# Patient Record
Sex: Male | Born: 1945 | Race: White | Hispanic: No | Marital: Single | Smoking: Never smoker
Health system: Southern US, Community
[De-identification: ages and names within clinical notes are randomized; demographics above are authoritative.]

## PROBLEM LIST (undated history)

## (undated) DIAGNOSIS — K921 Melena: Secondary | ICD-10-CM

## (undated) DIAGNOSIS — I2510 Atherosclerotic heart disease of native coronary artery without angina pectoris: Secondary | ICD-10-CM

## (undated) DIAGNOSIS — L02416 Cutaneous abscess of left lower limb: Secondary | ICD-10-CM

## (undated) DIAGNOSIS — I10 Essential (primary) hypertension: Secondary | ICD-10-CM

## (undated) DIAGNOSIS — N400 Enlarged prostate without lower urinary tract symptoms: Secondary | ICD-10-CM

## (undated) DIAGNOSIS — I4891 Unspecified atrial fibrillation: Secondary | ICD-10-CM

## (undated) DIAGNOSIS — Z7901 Long term (current) use of anticoagulants: Secondary | ICD-10-CM

## (undated) DIAGNOSIS — E079 Disorder of thyroid, unspecified: Secondary | ICD-10-CM

## (undated) DIAGNOSIS — C801 Malignant (primary) neoplasm, unspecified: Secondary | ICD-10-CM

## (undated) DIAGNOSIS — E119 Type 2 diabetes mellitus without complications: Secondary | ICD-10-CM

## (undated) DIAGNOSIS — Z741 Need for assistance with personal care: Secondary | ICD-10-CM

## (undated) HISTORY — PX: THYROIDECTOMY: SHX17

---
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
## 2005-08-31 ENCOUNTER — Ambulatory Visit (HOSPITAL_COMMUNITY): Admission: RE | Admit: 2005-08-31 | Discharge: 2005-08-31 | Payer: Self-pay | Admitting: Pulmonary Disease

## 2009-10-18 ENCOUNTER — Ambulatory Visit (HOSPITAL_COMMUNITY): Admission: RE | Admit: 2009-10-18 | Discharge: 2009-10-18 | Payer: Self-pay | Admitting: Pulmonary Disease

## 2009-10-29 ENCOUNTER — Ambulatory Visit (HOSPITAL_COMMUNITY): Admission: RE | Admit: 2009-10-29 | Discharge: 2009-10-29 | Payer: Self-pay | Admitting: Pulmonary Disease

## 2009-11-06 ENCOUNTER — Ambulatory Visit (HOSPITAL_COMMUNITY): Admission: RE | Admit: 2009-11-06 | Discharge: 2009-11-06 | Payer: Self-pay | Admitting: Pulmonary Disease

## 2009-11-11 ENCOUNTER — Encounter (HOSPITAL_COMMUNITY)
Admission: RE | Admit: 2009-11-11 | Discharge: 2009-12-11 | Payer: Self-pay | Source: Home / Self Care | Admitting: Oncology

## 2009-11-11 ENCOUNTER — Ambulatory Visit (HOSPITAL_COMMUNITY): Payer: Self-pay | Admitting: Oncology

## 2010-03-26 LAB — PROTIME-INR
INR: 0.98 (ref 0.00–1.49)
Prothrombin Time: 13.2 seconds (ref 11.6–15.2)
Prothrombin Time: 13.7 seconds (ref 11.6–15.2)

## 2010-03-26 LAB — DIFFERENTIAL
Basophils Absolute: 0 10*3/uL (ref 0.0–0.1)
Basophils Relative: 0 % (ref 0–1)
Eosinophils Absolute: 0.1 10*3/uL (ref 0.0–0.7)
Monocytes Relative: 10 % (ref 3–12)
Neutrophils Relative %: 67 % (ref 43–77)

## 2010-03-26 LAB — COMPREHENSIVE METABOLIC PANEL
ALT: 12 U/L (ref 0–53)
Alkaline Phosphatase: 35 U/L — ABNORMAL LOW (ref 39–117)
CO2: 29 mEq/L (ref 19–32)
Chloride: 94 mEq/L — ABNORMAL LOW (ref 96–112)
GFR calc non Af Amer: >60 mL/min (ref >60)
Glucose, Bld: 96 mg/dL (ref 70–99)
Potassium: 4.1 mEq/L (ref 3.5–5.1)
Sodium: 134 mEq/L — ABNORMAL LOW (ref 135–145)
Total Bilirubin: 0.9 mg/dL (ref 0.3–1.2)
Total Protein: 8.8 g/dL — ABNORMAL HIGH (ref 6.0–8.3)

## 2010-03-26 LAB — CBC
HCT: 44 % (ref 39.0–52.0)
Hemoglobin: 14.6 g/dL (ref 13.0–17.0)
Hemoglobin: 14.7 g/dL (ref 13.0–17.0)
MCH: 31.5 pg (ref 26.0–34.0)
MCV: 94.9 fL (ref 78.0–100.0)
RBC: 4.64 MIL/uL (ref 4.22–5.81)
WBC: 10.6 10*3/uL — ABNORMAL HIGH (ref 4.0–10.5)

## 2010-03-26 LAB — THYROGLOBULIN ANTIBODY: Thyroglobulin Ab: <20 U/mL (ref <40.0)

## 2010-03-26 LAB — GLUCOSE, CAPILLARY: Glucose-Capillary: 127 mg/dL — ABNORMAL HIGH (ref 70–99)

## 2010-05-13 ENCOUNTER — Emergency Department (HOSPITAL_COMMUNITY)
Admission: EM | Admit: 2010-05-13 | Discharge: 2010-05-13 | Disposition: A | Discharge status: Home / Self Care | Payer: Medicare Other | Attending: Emergency Medicine | Admitting: Emergency Medicine

## 2010-05-13 DIAGNOSIS — F840 Autistic disorder: Secondary | ICD-10-CM | POA: Insufficient documentation

## 2010-05-13 DIAGNOSIS — N289 Disorder of kidney and ureter, unspecified: Secondary | ICD-10-CM | POA: Insufficient documentation

## 2010-05-13 DIAGNOSIS — Z85850 Personal history of malignant neoplasm of thyroid: Secondary | ICD-10-CM | POA: Insufficient documentation

## 2010-05-13 DIAGNOSIS — R42 Dizziness and giddiness: Secondary | ICD-10-CM | POA: Insufficient documentation

## 2010-05-13 LAB — DIFFERENTIAL
Basophils Absolute: 0 10*3/uL (ref 0.0–0.1)
Basophils Relative: 0 % (ref 0–1)
Eosinophils Absolute: 0.2 10*3/uL (ref 0.0–0.7)
Eosinophils Relative: 2 % (ref 0–5)
Monocytes Absolute: 0.8 10*3/uL (ref 0.1–1.0)

## 2010-05-13 LAB — BASIC METABOLIC PANEL
CO2: 33 mEq/L — ABNORMAL HIGH (ref 19–32)
Calcium: >15 mg/dL (ref 8.4–10.5)
Creatinine, Ser: 2 mg/dL — ABNORMAL HIGH (ref 0.4–1.5)
GFR calc Af Amer: 41 mL/min — ABNORMAL LOW (ref >60)
Glucose, Bld: 116 mg/dL — ABNORMAL HIGH (ref 70–99)

## 2010-05-13 LAB — CBC
MCHC: 34 g/dL (ref 30.0–36.0)
RDW: 15.2 % (ref 11.5–15.5)

## 2010-05-13 LAB — GLUCOSE, CAPILLARY: Glucose-Capillary: 135 mg/dL — ABNORMAL HIGH (ref 70–99)

## 2010-11-07 DIAGNOSIS — C73 Malignant neoplasm of thyroid gland: Secondary | ICD-10-CM | POA: Insufficient documentation

## 2011-05-04 IMAGING — CT NM PET TUM IMG INITIAL (PI) SKULL BASE T - THIGH
6 series · 25 of 25 positions shown · IV contrast ([ID])
Comparison: 10/18/2009

CLINICAL DATA: Initial treatment strategy for metastatic
adenocarcinoma.

NUCLEAR MEDICINE PET CT INITIAL (PI) SKULL BASE TO THIGH
TECHNIQUE: 17.4 mCi F-18 FDG was injected intravenously via the
right hand.  Full-ring PET imaging was performed from the skull
base through the mid-thighs 65  minutes after injection.  CT data
was obtained and used for attenuation correction and anatomic
localization only.  (This was not acquired as a diagnostic CT
examination.)
Fasting Blood Glucose:  127

[Series 1: pet ac · axial · 3.3mm · 4.69mm/px · z∈[-880,-10]mm · 5 of 267 slices shown]
[im 1/267]
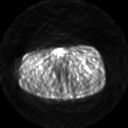
[im 67/267]
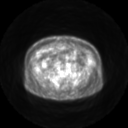
[im 134/267]
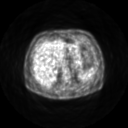
[im 200/267]
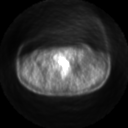
[im 267/267]
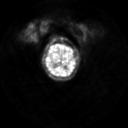

[Series 2: pet nac · axial · 3.3mm · 4.69mm/px · z∈[-880,-10]mm · 6 of 267 slices shown]
[im 1/267]
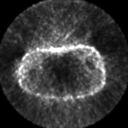
[im 54/267]
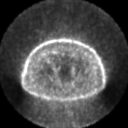
[im 107/267]
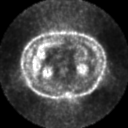
[im 160/267]
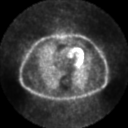
[im 213/267]
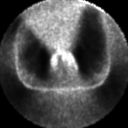
[im 267/267]
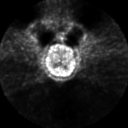

[Series 2: ct images · axial · 3.8mm · 0.98mm/px · z∈[-880,-10]mm · 5 of 267 slices shown]
[im 1/267]
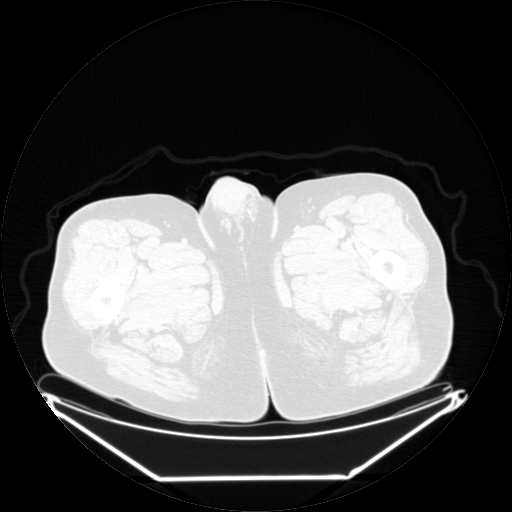
[im 67/267]
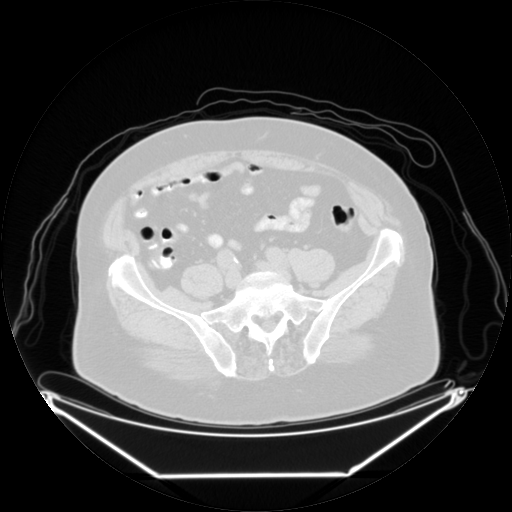
[im 134/267]
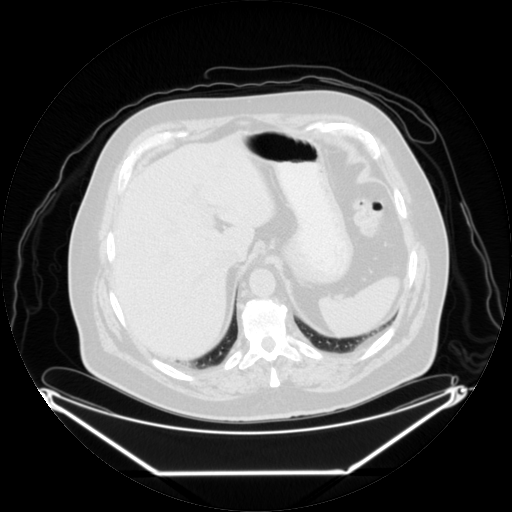
[im 200/267]
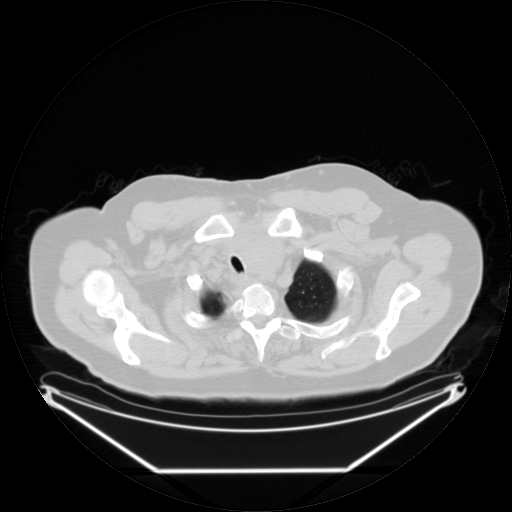
[im 267/267]
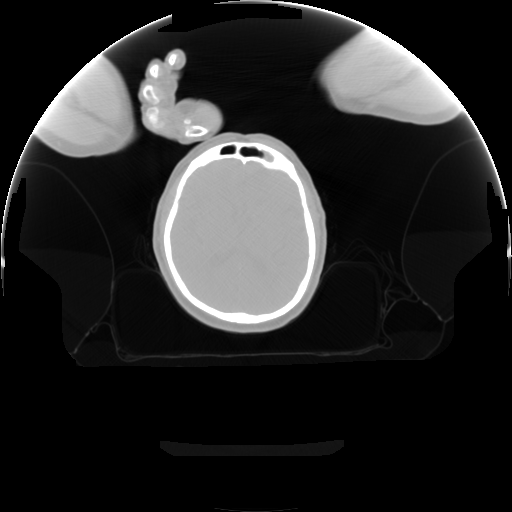

[Series 123: mip · coronal · 3.3mm · 4.69mm/px · 1 of 30 slices shown]
[im 1/30]
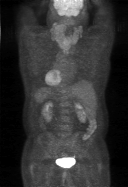

[Series 152: reformatted · axial · 3.3mm · 3.91mm/px · z∈[-880,-10]mm · 6 of 265 slices shown (1 of 2)]
[im 1/265]
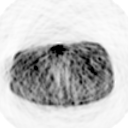
[im 53/265]
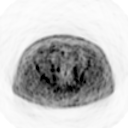
[im 106/265]
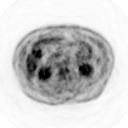
[im 159/265]
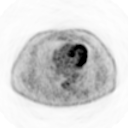
[im 212/265]
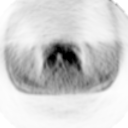
[im 265/265]
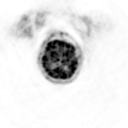

[Series 154: reformatted · coronal · 4.7mm · 6.98mm/px · 2 of 74 slices shown (2 of 2)]
[im 1/74]
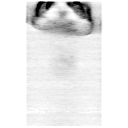
[im 74/74]
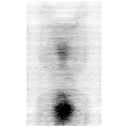

[25 of 25 positions shown; findings below may reference images not displayed]

FINDINGS: There is marked, diffuse enlargement of the thyroid gland as seen
on CT from 10/18/2009.  Intense radiotracer uptake throughout both
lobes of the thyroid gland is identified.  The SUV max associated
with the thyroid gland is equal to 11.9.  Enlarged and
hypermetabolic bilateral cervical adenopathy identified.  Level III
lymph node on the left measures 1.7 cm and has an SUV max equal to
7.0.

Level IV lymph node on the right measures 2.4 cm.  The SUV max
associated this lymph node is equal to 6.9.

Hypermetabolic left supraclavicular lymph node measures 1.8 cm and
has an SUV max equal to 8.1.

There is extension of the thyroid gland into the superior
mediastinum.  Several enlarged and hypermetabolic lymph nodes are
present within the superior mediastinum.  For example, left
paratracheal lymph node measures 1.6 cm and has an SUV max equal to
5.4.

There are no enlarged axillary lymph nodes.

No pericardial or pleural effusion present.

There are no hypermetabolic pulmonary parenchymal nodules or masses
identified.

There is no abnormal FDG uptake within the liver, spleen, adrenal
glands or pancreas.

No hypermetabolic lymph nodes within the soft tissues of the neck.

There are no hypermetabolic pelvic or inguinal lymph nodes.

No abnormal radiotracer uptake is identified within the visualized
axial or appendicular skeleton.
IMPRESSION: 1.  Massive enlargement of the thyroid gland which is diffusely
increased which exhibits diffuse increased radiotracer uptake.
Finding is suspicious for diffuse tumor infiltration.  Recommend
percutaneous biopsy.
2.  There are enlarged and hypermetabolic bilateral cervical, right
supraclavicular, and superior mediastinal lymph nodes which are
worrisome for metastatic adenopathy.

## 2012-07-27 DIAGNOSIS — K224 Dyskinesia of esophagus: Secondary | ICD-10-CM | POA: Insufficient documentation

## 2012-12-20 HISTORY — PX: NECK DISSECTION: SUR422

## 2013-08-10 DIAGNOSIS — G5790 Unspecified mononeuropathy of unspecified lower limb: Secondary | ICD-10-CM | POA: Insufficient documentation

## 2014-04-20 DIAGNOSIS — J302 Other seasonal allergic rhinitis: Secondary | ICD-10-CM | POA: Insufficient documentation

## 2014-10-26 DIAGNOSIS — I8393 Asymptomatic varicose veins of bilateral lower extremities: Secondary | ICD-10-CM | POA: Insufficient documentation

## 2018-02-03 DIAGNOSIS — Z87898 Personal history of other specified conditions: Secondary | ICD-10-CM | POA: Insufficient documentation

## 2018-02-03 DIAGNOSIS — Z8042 Family history of malignant neoplasm of prostate: Secondary | ICD-10-CM | POA: Insufficient documentation

## 2018-02-03 DIAGNOSIS — N401 Enlarged prostate with lower urinary tract symptoms: Secondary | ICD-10-CM | POA: Insufficient documentation

## 2018-02-06 ENCOUNTER — Emergency Department (HOSPITAL_COMMUNITY): Payer: Medicare HMO

## 2018-02-06 ENCOUNTER — Emergency Department (HOSPITAL_COMMUNITY)
Admission: EM | Admit: 2018-02-06 | Discharge: 2018-02-06 | Discharge status: Against Medical Advice | Payer: Medicare HMO | Attending: Emergency Medicine | Admitting: Emergency Medicine

## 2018-02-06 ENCOUNTER — Other Ambulatory Visit: Payer: Self-pay

## 2018-02-06 ENCOUNTER — Encounter (HOSPITAL_COMMUNITY): Payer: Self-pay | Admitting: Emergency Medicine

## 2018-02-06 DIAGNOSIS — I2510 Atherosclerotic heart disease of native coronary artery without angina pectoris: Secondary | ICD-10-CM | POA: Insufficient documentation

## 2018-02-06 DIAGNOSIS — I10 Essential (primary) hypertension: Secondary | ICD-10-CM | POA: Insufficient documentation

## 2018-02-06 DIAGNOSIS — E119 Type 2 diabetes mellitus without complications: Secondary | ICD-10-CM | POA: Diagnosis not present

## 2018-02-06 DIAGNOSIS — R55 Syncope and collapse: Secondary | ICD-10-CM | POA: Diagnosis present

## 2018-02-06 HISTORY — DX: Atherosclerotic heart disease of native coronary artery without angina pectoris: I25.10

## 2018-02-06 HISTORY — DX: Unspecified atrial fibrillation: I48.91

## 2018-02-06 HISTORY — DX: Type 2 diabetes mellitus without complications: E11.9

## 2018-02-06 HISTORY — DX: Essential (primary) hypertension: I10

## 2018-02-06 HISTORY — DX: Malignant (primary) neoplasm, unspecified: C80.1

## 2018-02-06 HISTORY — DX: Disorder of thyroid, unspecified: E07.9

## 2018-02-06 HISTORY — DX: Benign prostatic hyperplasia without lower urinary tract symptoms: N40.0

## 2018-02-06 LAB — CBC WITH DIFFERENTIAL/PLATELET
Abs Immature Granulocytes: 0.03 10*3/uL (ref 0.00–0.07)
Basophils Absolute: 0 10*3/uL (ref 0.0–0.1)
Basophils Relative: 1 %
EOS PCT: 1 %
Eosinophils Absolute: 0.1 10*3/uL (ref 0.0–0.5)
HCT: 42.8 % (ref 39.0–52.0)
Hemoglobin: 14.3 g/dL (ref 13.0–17.0)
Immature Granulocytes: 0 %
Lymphocytes Relative: 6 %
Lymphs Abs: 0.5 10*3/uL — ABNORMAL LOW (ref 0.7–4.0)
MCH: 31.1 pg (ref 26.0–34.0)
MCHC: 33.4 g/dL (ref 30.0–36.0)
MCV: 93 fL (ref 80.0–100.0)
Monocytes Absolute: 0.8 10*3/uL (ref 0.1–1.0)
Monocytes Relative: 9 %
Neutro Abs: 7 10*3/uL (ref 1.7–7.7)
Neutrophils Relative %: 83 %
Platelets: 223 10*3/uL (ref 150–400)
RBC: 4.6 MIL/uL (ref 4.22–5.81)
RDW: 12.9 % (ref 11.5–15.5)
WBC: 8.4 10*3/uL (ref 4.0–10.5)
nRBC: 0 % (ref 0.0–0.2)

## 2018-02-06 LAB — BASIC METABOLIC PANEL
Anion gap: 11 (ref 5–15)
BUN: 21 mg/dL (ref 8–23)
CHLORIDE: 93 mmol/L — AB (ref 98–111)
CO2: 28 mmol/L (ref 22–32)
Calcium: 7.6 mg/dL — ABNORMAL LOW (ref 8.9–10.3)
Creatinine, Ser: 0.9 mg/dL (ref 0.61–1.24)
GFR calc non Af Amer: >60 mL/min (ref >60)
Glucose, Bld: 132 mg/dL — ABNORMAL HIGH (ref 70–99)
POTASSIUM: 3.8 mmol/L (ref 3.5–5.1)
SODIUM: 132 mmol/L — AB (ref 135–145)

## 2018-02-06 LAB — TROPONIN I: Troponin I: <0.03 ng/mL (ref <0.03)

## 2018-02-06 NOTE — ED Notes (Signed)
Pt requested to be transferred to [facility] but they could not accept him at this time. The pt signed out AMA and would go POV so that he could be seen at [facility]. Pt verbalized understanding of this.

## 2018-02-06 NOTE — Discharge Instructions (Addendum)
Your risk of dying suddenly by not being on heart monitor.  Please go to [facility] or another emergency department immediately upon leaving here for further evaluation.  You have an abnormality on your chest x-ray.  We recommend that you have a CT scan of your chest within the next few weeks

## 2018-02-06 NOTE — ED Triage Notes (Signed)
Pt comes in with complaint of near syncope this morning.  States he went into the kitchen to eat breakfast, sat down, and thought he was going to pass out.  EMS reported initial bp to be 03J systolic with frequent pvcs on monitor.  Pt had a foley placed on Thursday for urinary retention and has been having blood in urine since.  Penis noted to be very edematous with blood on foley tubing.  Also had recent thyroid biopsy for recurrent thyroid cancer and area is red and raised.

## 2018-02-06 NOTE — ED Provider Notes (Addendum)
[facility] EMERGENCY DEPARTMENT Provider Note   CSN: [id number] Arrival date & time: 02/06/18  3762     History   Chief Complaint Chief Complaint  Patient presents with  . Near Syncope    HPI Paul Bradley is a 73 y.o. male.  HPI Patient had syncopal event this morning 645.  His sister said that his speech was slurred for 1 or 2 minutes prior to the syncopal event.  He complained of left shoulder pain prior to syncopal event however is presently pain-free.  He denies any shortness of breath denies chest pain denies abdominal pain.  No other associated symptoms.  No treatment prior to coming here.  Brought by EMS. Past Medical History:  Diagnosis Date  . Atrial fibrillation ([place])   . BPH (benign prostatic hyperplasia)   . Cancer ([place])    thyroid  . Coronary artery disease   . Diabetes mellitus without complication ([place])   . Hypertension   . Thyroid disease     There are no active problems to display for this patient.   Past Surgical History:  Procedure Laterality Date  . THYROIDECTOMY          Home Medications    Prior to Admission medications   Not on File    Family History History reviewed. No pertinent family history.  Social History Social History   Tobacco Use  . Smoking status: Never Smoker  . Smokeless tobacco: Never Used  Substance Use Topics  . Alcohol use: Never    Frequency: Never  . Drug use: Never     Allergies   Penicillins   Review of Systems Review of Systems  Genitourinary: Positive for difficulty urinating.       Currently with indwelling Foley catheter, placed last week for urinary retention  Musculoskeletal: Positive for arthralgias.       Shoulder pain resolved  Allergic/Immunologic: Positive for immunocompromised state.       Cancer patient  Hematological: Bruises/bleeds easily.     Physical Exam Updated Vital Signs Ht 6' (1.829 m)   Wt 83.5 kg   BMI 24.95 kg/m   Physical Exam Vitals signs and nursing note  reviewed.  Constitutional:      Appearance: Normal appearance. He is well-developed. He is not ill-appearing.  HENT:     Head: Normocephalic and atraumatic.  Eyes:     Conjunctiva/sclera: Conjunctivae normal.     Pupils: Pupils are equal, round, and reactive to light.  Neck:     Musculoskeletal: Neck supple.     Thyroid: No thyromegaly.     Trachea: No tracheal deviation.     Comments: Surgical scar at anterior neck.  Area is not red swollen or tender. Cardiovascular:     Rate and Rhythm: Normal rate.     Pulses: Normal pulses.     Heart sounds: No murmur.     Comments: Irregularly irregular Pulmonary:     Effort: Pulmonary effort is normal.     Breath sounds: Normal breath sounds.  Abdominal:     General: Bowel sounds are normal. There is no distension.     Palpations: Abdomen is soft.     Tenderness: There is no abdominal tenderness.  Genitourinary:    Comments: Indwelling Foley catheter minimal swelling at glans penis minimal dried blood at urethral meatus.  No gross hematuria in catheter bag.  Urine is rust colored Musculoskeletal: Normal range of motion.        General: No swelling, tenderness, deformity or signs  of injury.     Right lower leg: No edema.     Left lower leg: No edema.     Comments: He has mild pain at left trapezius muscle upon forcible flexion of left shoulder, reproducing pain that he complained of earlier  Skin:    General: Skin is warm and dry.     Findings: No rash.  Neurological:     Mental Status: He is alert.     Coordination: Coordination normal.      ED Treatments / Results  Labs (all labs ordered are listed, but only abnormal results are displayed) Labs Reviewed - No data to display  EKG EKG Interpretation  Date/Time:  Sunday February 06 2018 07:40:47 EST Ventricular Rate:  81 PR Interval:    QRS Duration: 86 QT Interval:  395 QTC Calculation: 459 R Axis:   79 Text Interpretation:  Atrial fibrillation Low voltage, extremity leads  Nonspecific T abnormalities, lateral leads Atrial fibrillation New since previous tracing Confirmed by Orlie Dakin [phone]) on 02/06/2018 8:17:42 AM  X-ray viewed by me Results for orders placed or performed during the hospital encounter of 70/17/79  Basic metabolic panel  Result Value Ref Range   Sodium 132 (L) 135 - 145 mmol/L   Potassium 3.8 3.5 - 5.1 mmol/L   Chloride 93 (L) 98 - 111 mmol/L   CO2 28 22 - 32 mmol/L   Glucose, Bld 132 (H) 70 - 99 mg/dL   BUN 21 8 - 23 mg/dL   Creatinine, Ser 0.90 0.61 - 1.24 mg/dL   Calcium 7.6 (L) 8.9 - 10.3 mg/dL   GFR calc non Af Amer >60 >60 mL/min   GFR calc Af Amer >60 >60 mL/min   Anion gap 11 5 - 15  CBC with Differential/Platelet  Result Value Ref Range   WBC 8.4 4.0 - 10.5 K/uL   RBC 4.60 4.22 - 5.81 MIL/uL   Hemoglobin 14.3 13.0 - 17.0 g/dL   HCT 42.8 39.0 - 52.0 %   MCV 93.0 80.0 - 100.0 fL   MCH 31.1 26.0 - 34.0 pg   MCHC 33.4 30.0 - 36.0 g/dL   RDW 12.9 11.5 - 15.5 %   Platelets 223 150 - 400 K/uL   nRBC 0.0 0.0 - 0.2 %   Neutrophils Relative % 83 %   Neutro Abs 7.0 1.7 - 7.7 K/uL   Lymphocytes Relative 6 %   Lymphs Abs 0.5 (L) 0.7 - 4.0 K/uL   Monocytes Relative 9 %   Monocytes Absolute 0.8 0.1 - 1.0 K/uL   Eosinophils Relative 1 %   Eosinophils Absolute 0.1 0.0 - 0.5 K/uL   Basophils Relative 1 %   Basophils Absolute 0.0 0.0 - 0.1 K/uL   Immature Granulocytes 0 %   Abs Immature Granulocytes 0.03 0.00 - 0.07 K/uL  Troponin I - Add-On to previous collection  Result Value Ref Range   Troponin I <0.03 <0.03 ng/mL   Dg Chest Port 1 View  Result Date: 02/06/2018 CLINICAL DATA:  Near syncope this morning. Recent thyroid biopsy for recurrent thyroid carcinoma. EXAM: PORTABLE CHEST 1 VIEW COMPARISON:  None. FINDINGS: Postsurgical changes in the lower neck. Significant prominence of the upper RIGHT mediastinum. Lungs are clear. No pleural effusion or pneumothorax seen. Osseous structures about the chest are unremarkable.  IMPRESSION: 1. Significant prominence of the upper RIGHT mediastinum. This could represent inferior extension of a thyroid mass or mediastinal lymphadenopathy. Recommend chest CT with contrast for further characterization. 2. Lungs are clear.  Electronically Signed   By: Franki Cabot M.D.   On: 02/06/2018 08:33   Radiology No results found.  Procedures Procedures (including critical care time)  Medications Ordered in ED Medications - No data to display Chest x-ray viewed by me  Initial Impression / Assessment and Plan / ED Course  I have reviewed the triage vital signs and the nursing notes.  Pertinent labs & imaging results that were available during my care of the patient were reviewed by me and considered in my medical decision making (see chart for details).     Patient requesting transfer to [facility].  PCP is Dr.Demons-[name] I called transfer center at [facility] and spoke with DrPannu hospitalist.  No bed available.  I also spoke with Dr. Caryl Comes, emergency physician at [facility] who explained he cannot accept patient in transfer to the emergency department at [facility] since no bed is available.  Patient and family have decided to leave here [place] and states they will drive him directly to [facility] emergency department.  Explained risk of sudden cardiac death which family and patient are aware of and understand.  Patient will be supplied with copies of diagnostics and EKG and chest x-ray Work unremarkable. Final Clinical Impressions(s) / ED Diagnoses  DX #1 syncope #2 abnormal chest x-ray Final diagnoses:  None    ED Discharge Orders    None       Orlie Dakin, MD 02/06/18 [place], [place], MD 02/06/18 1114

## 2018-02-23 DIAGNOSIS — N312 Flaccid neuropathic bladder, not elsewhere classified: Secondary | ICD-10-CM | POA: Insufficient documentation

## 2018-02-23 DIAGNOSIS — N281 Cyst of kidney, acquired: Secondary | ICD-10-CM | POA: Insufficient documentation

## 2018-02-23 DIAGNOSIS — R339 Retention of urine, unspecified: Secondary | ICD-10-CM | POA: Insufficient documentation

## 2018-03-25 DIAGNOSIS — E039 Hypothyroidism, unspecified: Secondary | ICD-10-CM | POA: Insufficient documentation

## 2018-03-25 DIAGNOSIS — R627 Adult failure to thrive: Secondary | ICD-10-CM | POA: Insufficient documentation

## 2018-03-25 DIAGNOSIS — Z7901 Long term (current) use of anticoagulants: Secondary | ICD-10-CM | POA: Insufficient documentation

## 2018-03-25 DIAGNOSIS — G988 Other disorders of nervous system: Secondary | ICD-10-CM | POA: Insufficient documentation

## 2018-04-14 DIAGNOSIS — R1312 Dysphagia, oropharyngeal phase: Secondary | ICD-10-CM | POA: Insufficient documentation

## 2018-05-05 DIAGNOSIS — K921 Melena: Secondary | ICD-10-CM

## 2018-05-05 HISTORY — DX: Melena: K92.1

## 2018-06-01 ENCOUNTER — Other Ambulatory Visit (HOSPITAL_COMMUNITY): Payer: Self-pay | Admitting: Urology

## 2018-06-01 DIAGNOSIS — R339 Retention of urine, unspecified: Secondary | ICD-10-CM

## 2018-06-03 ENCOUNTER — Other Ambulatory Visit: Payer: Self-pay | Admitting: Student

## 2018-06-04 ENCOUNTER — Emergency Department (HOSPITAL_COMMUNITY): Payer: Medicare HMO

## 2018-06-04 ENCOUNTER — Encounter (HOSPITAL_COMMUNITY): Payer: Self-pay

## 2018-06-04 ENCOUNTER — Other Ambulatory Visit: Payer: Self-pay

## 2018-06-04 ENCOUNTER — Emergency Department (HOSPITAL_COMMUNITY)
Admission: EM | Admit: 2018-06-04 | Discharge: 2018-06-04 | Disposition: A | Discharge status: Home / Self Care | Payer: Medicare HMO | Attending: Emergency Medicine | Admitting: Emergency Medicine

## 2018-06-04 DIAGNOSIS — E119 Type 2 diabetes mellitus without complications: Secondary | ICD-10-CM | POA: Diagnosis not present

## 2018-06-04 DIAGNOSIS — I4891 Unspecified atrial fibrillation: Secondary | ICD-10-CM | POA: Insufficient documentation

## 2018-06-04 DIAGNOSIS — C73 Malignant neoplasm of thyroid gland: Secondary | ICD-10-CM | POA: Insufficient documentation

## 2018-06-04 DIAGNOSIS — Z79899 Other long term (current) drug therapy: Secondary | ICD-10-CM | POA: Diagnosis not present

## 2018-06-04 DIAGNOSIS — R531 Weakness: Secondary | ICD-10-CM

## 2018-06-04 DIAGNOSIS — I2510 Atherosclerotic heart disease of native coronary artery without angina pectoris: Secondary | ICD-10-CM | POA: Insufficient documentation

## 2018-06-04 LAB — CBC WITH DIFFERENTIAL/PLATELET
Abs Immature Granulocytes: 0.02 10*3/uL (ref 0.00–0.07)
Basophils Absolute: 0.1 10*3/uL (ref 0.0–0.1)
Basophils Relative: 1 %
Eosinophils Absolute: 0 10*3/uL (ref 0.0–0.5)
Eosinophils Relative: 0 %
HCT: 41.2 % (ref 39.0–52.0)
Hemoglobin: 13.6 g/dL (ref 13.0–17.0)
Immature Granulocytes: 0 %
Lymphocytes Relative: 17 %
Lymphs Abs: 1.2 10*3/uL (ref 0.7–4.0)
MCH: 30.9 pg (ref 26.0–34.0)
MCHC: 33 g/dL (ref 30.0–36.0)
MCV: 93.6 fL (ref 80.0–100.0)
Monocytes Absolute: 0.6 10*3/uL (ref 0.1–1.0)
Monocytes Relative: 9 %
Neutro Abs: 4.9 10*3/uL (ref 1.7–7.7)
Neutrophils Relative %: 73 %
Platelets: 263 10*3/uL (ref 150–400)
RBC: 4.4 MIL/uL (ref 4.22–5.81)
RDW: 15.2 % (ref 11.5–15.5)
WBC: 6.7 10*3/uL (ref 4.0–10.5)
nRBC: 0 % (ref 0.0–0.2)

## 2018-06-04 LAB — COMPREHENSIVE METABOLIC PANEL
ALT: 19 U/L (ref 0–44)
AST: 40 U/L (ref 15–41)
Albumin: 3.3 g/dL — ABNORMAL LOW (ref 3.5–5.0)
Alkaline Phosphatase: 43 U/L (ref 38–126)
Anion gap: 11 (ref 5–15)
BUN: 28 mg/dL — ABNORMAL HIGH (ref 8–23)
CO2: 30 mmol/L (ref 22–32)
Calcium: 12.1 mg/dL — ABNORMAL HIGH (ref 8.9–10.3)
Chloride: 98 mmol/L (ref 98–111)
Creatinine, Ser: 1.18 mg/dL (ref 0.61–1.24)
GFR calc Af Amer: >60 mL/min (ref >60)
GFR calc non Af Amer: >60 mL/min (ref >60)
Glucose, Bld: 105 mg/dL — ABNORMAL HIGH (ref 70–99)
Potassium: 3.5 mmol/L (ref 3.5–5.1)
Sodium: 139 mmol/L (ref 135–145)
Total Bilirubin: 0.8 mg/dL (ref 0.3–1.2)
Total Protein: 7.6 g/dL (ref 6.5–8.1)

## 2018-06-04 LAB — MAGNESIUM: Magnesium: 2 mg/dL (ref 1.7–2.4)

## 2018-06-04 MED ORDER — SODIUM CHLORIDE 0.9 % IV BOLUS
500.0000 mL | Freq: Once | INTRAVENOUS | Status: AC
  Administered 2018-06-04: 500 mL via INTRAVENOUS

## 2018-06-04 MED ORDER — METOPROLOL SUCCINATE ER 25 MG PO TB24
25.0000 mg | ORAL_TABLET | Freq: Every day | ORAL | Status: DC
  Administered 2018-06-04: 25 mg via ORAL
  Filled 2018-06-04: qty 1

## 2018-06-04 MED ORDER — LISINOPRIL 10 MG PO TABS
10.0000 mg | ORAL_TABLET | Freq: Once | ORAL | Status: AC
  Administered 2018-06-04: 10 mg via ORAL
  Filled 2018-06-04: qty 1

## 2018-06-04 MED ORDER — SODIUM CHLORIDE 0.9 % IV SOLN
INTRAVENOUS | Status: DC
  Administered 2018-06-04: 10:00:00 via INTRAVENOUS

## 2018-06-04 NOTE — ED Triage Notes (Signed)
cbg 108 per ems

## 2018-06-04 NOTE — ED Notes (Signed)
Patient ambulated approx. 161ft with front wheeled walker and stay by assist.  Patient stated that this is his usual state of ambulation and feels this is his baseline.

## 2018-06-04 NOTE — ED Provider Notes (Signed)
[facility] EMERGENCY DEPARTMENT Provider Note   CSN: [id number] Arrival date & time: 06/04/18  0825    History   Chief Complaint Chief Complaint  Patient presents with  . Weakness    HPI Paul Bradley is a 73 y.o. male.     Patient sent in by EMS for generalized weakness this morning.  Not able to get out of bed or stand.  Patient has a history of papillary thyroid cancer.  Is followed by hematology oncology at [place] to have worsening mediastinal and thoracic adenopathy based on CAT scans from September 2019.  Patient was seen by [facility] yesterday.  Was noted to have a calcium of 12.1.  Was given Zometa.  Was noted to have a low sodium and was given some IV normal saline.  It was stated that patient did not desire admission if it could be avoided.  Shortly after arrival here patient was feeling back to normal.  No specific complaints.  Patient denies any history of fever or upper respiratory symptoms.  Patient states he did not have any of his morning meds.  Medication review shows that patient is on Calcitrol.  Patient normally ambulates with a walker at home.. Patient also has a history of atrial fibrillation.  Is on Eliquis for that.     Past Medical History:  Diagnosis Date  . Atrial fibrillation ([place])   . BPH (benign prostatic hyperplasia)   . Cancer ([place])    thyroid  . Coronary artery disease   . Diabetes mellitus without complication ([place])   . Hypertension   . Thyroid disease     There are no active problems to display for this patient.   Past Surgical History:  Procedure Laterality Date  . THYROIDECTOMY          Home Medications    Prior to Admission medications   Medication Sig Start Date End Date Taking? Authorizing Provider  apixaban (ELIQUIS) 5 MG TABS tablet Take 5 mg by mouth 2 (two) times daily.    [provider]  Ascorbic Acid (VITAMIN C) 1000 MG tablet Take 1,000 mg by mouth daily.    [provider]  calcitRIOL  (ROCALTROL) 0.5 MCG capsule Take 0.5 mcg by mouth 2 (two) times daily.    [provider]  hydrochlorothiazide (HYDRODIURIL) 25 MG tablet Take 25 mg by mouth daily.    [provider]  levothyroxine (SYNTHROID, LEVOTHROID) 150 MCG tablet Take 150 mcg by mouth daily before breakfast.    [provider]  lisinopril (PRINIVIL,ZESTRIL) 10 MG tablet Take 10 mg by mouth daily.    [provider]  metoprolol succinate (TOPROL-XL) 25 MG 24 hr tablet Take 25 mg by mouth daily.    [provider]  tamsulosin (FLOMAX) 0.4 MG CAPS capsule Take 0.4 mg by mouth.    [provider]    Family History No family history on file.  Social History Social History   Tobacco Use  . Smoking status: Never Smoker  . Smokeless tobacco: Never Used  Substance Use Topics  . Alcohol use: Never    Frequency: Never  . Drug use: Never     Allergies   Penicillins   Review of Systems Review of Systems  Constitutional: Negative for chills and fever.  HENT: Negative for congestion, rhinorrhea and sore throat.   Eyes: Negative for visual disturbance.  Respiratory: Negative for cough and shortness of breath.   Cardiovascular: Negative for chest pain and leg swelling.  Gastrointestinal: Negative for abdominal pain, diarrhea, nausea and vomiting.  Genitourinary: Negative for dysuria.  Musculoskeletal: Negative for back pain and neck pain.  Skin: Negative for rash.  Neurological: Positive for weakness. Negative for dizziness, light-headedness and headaches.  Hematological: Does not bruise/bleed easily.  Psychiatric/Behavioral: Negative for confusion.     Physical Exam Updated Vital Signs BP (!) 161/106 (BP Location: Right Arm)   Pulse (!) 101   Temp 98.1 F (36.7 C) (Oral)   Resp 18   Ht 1.829 m (6')   Wt 70.8 kg   SpO2 96%   BMI 21.16 kg/m   Physical Exam Vitals signs and nursing note reviewed.  Constitutional:      General: He is not in acute  distress.    Appearance: Normal appearance. He is well-developed.  HENT:     Head: Normocephalic and atraumatic.  Eyes:     Extraocular Movements: Extraocular movements intact.     Conjunctiva/sclera: Conjunctivae normal.     Pupils: Pupils are equal, round, and reactive to light.  Neck:     Musculoskeletal: Normal range of motion and neck supple.     Comments: Anterior neck with some skin changes.  Perhaps secondary to radiation.  No significant fullness.  Patient without any difficulty swallowing.  Voice appears normal. Cardiovascular:     Rate and Rhythm: Normal rate and regular rhythm.     Heart sounds: No murmur.  Pulmonary:     Effort: Pulmonary effort is normal. No respiratory distress.     Breath sounds: Normal breath sounds.  Abdominal:     Palpations: Abdomen is soft.     Tenderness: There is no abdominal tenderness.  Musculoskeletal: Normal range of motion.        General: No swelling.  Skin:    General: Skin is warm and dry.     Capillary Refill: Capillary refill takes less than 2 seconds.  Neurological:     General: No focal deficit present.     Mental Status: He is alert and oriented to person, place, and time.     Cranial Nerves: No cranial nerve deficit.     Sensory: No sensory deficit.     Motor: No weakness.      ED Treatments / Results  Labs (all labs ordered are listed, but only abnormal results are displayed) Labs Reviewed  COMPREHENSIVE METABOLIC PANEL - Abnormal; Notable for the following components:      Result Value   Glucose, Bld 105 (*)    BUN 28 (*)    Calcium 12.1 (*)    Albumin 3.3 (*)    All other components within normal limits  CBC WITH DIFFERENTIAL/PLATELET  MAGNESIUM  CALCIUM, IONIZED    EKG EKG Interpretation  Date/Time:  Saturday Jun 04 2018 08:44:34 EDT Ventricular Rate:  103 PR Interval:    QRS Duration: 85 QT Interval:  334 QTC Calculation: 438 R Axis:   108 Text Interpretation:  Atrial fibrillation Probable lateral  infarct, age indeterminate Confirmed by Fredia Sorrow [phone]) on 06/04/2018 9:30:07 AM   Radiology Dg Chest Port 1 View  Result Date: 06/04/2018 CLINICAL DATA:  Weakness.  Thyroid cancer. EXAM: PORTABLE CHEST 1 VIEW COMPARISON:  02/06/2018 FINDINGS: Heart size upper normal. Negative for heart failure. No infiltrate or effusion Surgical clips in the region of the thyroid. Prominent soft tissue on both sides of the trachea could represent substernal goiter or adenopathy. Note is made of goiter on the CT of 11/06/2009 however there has been interval thyroidectomy.  IMPRESSION: Prominent superior mediastinal soft tissue compatible with goiter or adenopathy. Recommend CT neck and chest with contrast. Electronically Signed   By: Franchot Gallo M.D.   On: 06/04/2018 09:01    Procedures Procedures (including critical care time)  Medications Ordered in ED Medications  0.9 %  sodium chloride infusion ( Intravenous New Bag/Given 06/04/18 0945)  metoprolol succinate (TOPROL-XL) 24 hr tablet 25 mg (25 mg Oral Given 06/04/18 1039)  sodium chloride 0.9 % bolus 500 mL (0 mLs Intravenous Stopped 06/04/18 0946)  lisinopril (ZESTRIL) tablet 10 mg (10 mg Oral Given 06/04/18 1039)     Initial Impression / Assessment and Plan / ED Course  I have reviewed the triage vital signs and the nursing notes.  Pertinent labs & imaging results that were available during my care of the patient were reviewed by me and considered in my medical decision making (see chart for details).       Patient ambulated with walker here fine patient states that that is how he normally gets around.  Lab work-up shows that calcium was 12.1.  Sodium was normal today at 139.  Did note that patient is receiving Calcitrol.  Supposed to be 0.5 mcg daily.  [facility] oncology on call spoke with Dr. Phillip Heal.  He recommended having patient go with the Calcitrol every other day.  And they will follow him up in the clinic later this week.   Patient seems to be baseline from the weakness standpoint.  No indication for admission based on today's visit.  Patient has a known history of atrial fibrillation.  Is on Eliquis for that.  Patient was slightly tachycardic today and blood pressure was elevated he received his lisinopril and Toprol XL she did not have this morning before coming in.   Final Clinical Impressions(s) / ED Diagnoses   Final diagnoses:  Hypercalcemia of malignancy  Generalized weakness    ED Discharge Orders    None       Fredia Sorrow, MD 06/04/18 1109

## 2018-06-04 NOTE — Discharge Instructions (Signed)
Discussed with oncology at [facility] Dr. Phillip Heal.  They recommend that you take your Calcitrol every other day at a dose of 0.5 mcg.  Make an appointment to follow-up with them in about a week.  Return for any new or worse symptoms.  Otherwise labs today without any significant abnormalities.  Your sodium has improved significantly and is now in a normal range.  Patient was ambulated here with walker and did very well.  Weakness seems to have resolved.

## 2018-06-04 NOTE — ED Triage Notes (Signed)
EMS reports pt has thyroid cancer.  Reports new onset of immobility that started this morning.  Reports pt wasn't able to get oob due to weakness.  Reports was at [facility] yesterday.  Reports labs were abnormal and was given some na and calcium IV.

## 2018-06-05 ENCOUNTER — Emergency Department (HOSPITAL_COMMUNITY): Payer: Medicare HMO

## 2018-06-05 ENCOUNTER — Encounter (HOSPITAL_COMMUNITY): Payer: Self-pay | Admitting: Emergency Medicine

## 2018-06-05 ENCOUNTER — Observation Stay (HOSPITAL_COMMUNITY)
Admission: EM | Admit: 2018-06-05 | Discharge: 2018-06-09 | Disposition: A | Discharge status: Home / Self Care | Payer: Medicare HMO | Attending: Family Medicine | Admitting: Family Medicine

## 2018-06-05 ENCOUNTER — Other Ambulatory Visit: Payer: Self-pay

## 2018-06-05 DIAGNOSIS — Z20828 Contact with and (suspected) exposure to other viral communicable diseases: Secondary | ICD-10-CM | POA: Insufficient documentation

## 2018-06-05 DIAGNOSIS — I10 Essential (primary) hypertension: Secondary | ICD-10-CM | POA: Insufficient documentation

## 2018-06-05 DIAGNOSIS — Z7901 Long term (current) use of anticoagulants: Secondary | ICD-10-CM

## 2018-06-05 DIAGNOSIS — E119 Type 2 diabetes mellitus without complications: Secondary | ICD-10-CM | POA: Insufficient documentation

## 2018-06-05 DIAGNOSIS — E876 Hypokalemia: Secondary | ICD-10-CM | POA: Diagnosis not present

## 2018-06-05 DIAGNOSIS — I2510 Atherosclerotic heart disease of native coronary artery without angina pectoris: Secondary | ICD-10-CM | POA: Diagnosis not present

## 2018-06-05 DIAGNOSIS — Z85850 Personal history of malignant neoplasm of thyroid: Secondary | ICD-10-CM | POA: Insufficient documentation

## 2018-06-05 DIAGNOSIS — D62 Acute posthemorrhagic anemia: Secondary | ICD-10-CM | POA: Diagnosis present

## 2018-06-05 DIAGNOSIS — K921 Melena: Secondary | ICD-10-CM | POA: Diagnosis not present

## 2018-06-05 DIAGNOSIS — K5790 Diverticulosis of intestine, part unspecified, without perforation or abscess without bleeding: Secondary | ICD-10-CM | POA: Diagnosis present

## 2018-06-05 DIAGNOSIS — R197 Diarrhea, unspecified: Secondary | ICD-10-CM | POA: Diagnosis not present

## 2018-06-05 DIAGNOSIS — D500 Iron deficiency anemia secondary to blood loss (chronic): Secondary | ICD-10-CM | POA: Insufficient documentation

## 2018-06-05 DIAGNOSIS — Z79899 Other long term (current) drug therapy: Secondary | ICD-10-CM | POA: Insufficient documentation

## 2018-06-05 DIAGNOSIS — I951 Orthostatic hypotension: Secondary | ICD-10-CM

## 2018-06-05 DIAGNOSIS — I482 Chronic atrial fibrillation, unspecified: Secondary | ICD-10-CM | POA: Diagnosis present

## 2018-06-05 DIAGNOSIS — R531 Weakness: Secondary | ICD-10-CM | POA: Diagnosis not present

## 2018-06-05 DIAGNOSIS — K922 Gastrointestinal hemorrhage, unspecified: Secondary | ICD-10-CM | POA: Diagnosis not present

## 2018-06-05 DIAGNOSIS — D649 Anemia, unspecified: Secondary | ICD-10-CM | POA: Diagnosis not present

## 2018-06-05 DIAGNOSIS — E039 Hypothyroidism, unspecified: Secondary | ICD-10-CM | POA: Diagnosis not present

## 2018-06-05 HISTORY — DX: Long term (current) use of anticoagulants: Z79.01

## 2018-06-05 HISTORY — DX: Need for assistance with personal care: Z74.1

## 2018-06-05 HISTORY — DX: Cutaneous abscess of left lower limb: L02.416

## 2018-06-05 LAB — PROTIME-INR
INR: 1.2 (ref 0.8–1.2)
Prothrombin Time: 15.3 seconds — ABNORMAL HIGH (ref 11.4–15.2)

## 2018-06-05 LAB — COMPREHENSIVE METABOLIC PANEL
ALT: 18 U/L (ref 0–44)
AST: 37 U/L (ref 15–41)
Albumin: 3 g/dL — ABNORMAL LOW (ref 3.5–5.0)
Alkaline Phosphatase: 40 U/L (ref 38–126)
Anion gap: 10 (ref 5–15)
BUN: 30 mg/dL — ABNORMAL HIGH (ref 8–23)
CO2: 29 mmol/L (ref 22–32)
Calcium: 10.7 mg/dL — ABNORMAL HIGH (ref 8.9–10.3)
Chloride: 91 mmol/L — ABNORMAL LOW (ref 98–111)
Creatinine, Ser: 1.26 mg/dL — ABNORMAL HIGH (ref 0.61–1.24)
GFR calc Af Amer: >60 mL/min (ref >60)
GFR calc non Af Amer: 56 mL/min — ABNORMAL LOW (ref >60)
Glucose, Bld: 113 mg/dL — ABNORMAL HIGH (ref 70–99)
Potassium: 3 mmol/L — ABNORMAL LOW (ref 3.5–5.1)
Sodium: 130 mmol/L — ABNORMAL LOW (ref 135–145)
Total Bilirubin: 1 mg/dL (ref 0.3–1.2)
Total Protein: 6.5 g/dL (ref 6.5–8.1)

## 2018-06-05 LAB — CBC WITH DIFFERENTIAL/PLATELET
Abs Immature Granulocytes: 0.03 10*3/uL (ref 0.00–0.07)
Basophils Absolute: 0 10*3/uL (ref 0.0–0.1)
Basophils Relative: 0 %
Eosinophils Absolute: 0 10*3/uL (ref 0.0–0.5)
Eosinophils Relative: 0 %
HCT: 35 % — ABNORMAL LOW (ref 39.0–52.0)
Hemoglobin: 11.8 g/dL — ABNORMAL LOW (ref 13.0–17.0)
Immature Granulocytes: 0 %
Lymphocytes Relative: 14 %
Lymphs Abs: 1.3 10*3/uL (ref 0.7–4.0)
MCH: 31.6 pg (ref 26.0–34.0)
MCHC: 33.7 g/dL (ref 30.0–36.0)
MCV: 93.6 fL (ref 80.0–100.0)
Monocytes Absolute: 1 10*3/uL (ref 0.1–1.0)
Monocytes Relative: 10 %
Neutro Abs: 7.2 10*3/uL (ref 1.7–7.7)
Neutrophils Relative %: 76 %
Platelets: 240 10*3/uL (ref 150–400)
RBC: 3.74 MIL/uL — ABNORMAL LOW (ref 4.22–5.81)
RDW: 15 % (ref 11.5–15.5)
WBC: 9.6 10*3/uL (ref 4.0–10.5)
nRBC: 0 % (ref 0.0–0.2)

## 2018-06-05 LAB — POC OCCULT BLOOD, ED: Fecal Occult Bld: POSITIVE — AB

## 2018-06-05 LAB — URINALYSIS, ROUTINE W REFLEX MICROSCOPIC
Bilirubin Urine: NEGATIVE
Glucose, UA: NEGATIVE mg/dL
Ketones, ur: NEGATIVE mg/dL
Nitrite: NEGATIVE
Protein, ur: NEGATIVE mg/dL
RBC / HPF: >50 RBC/hpf — ABNORMAL HIGH (ref 0–5)
Specific Gravity, Urine: 1.013 (ref 1.005–1.030)
pH: 5 (ref 5.0–8.0)

## 2018-06-05 LAB — MAGNESIUM: Magnesium: 1.8 mg/dL (ref 1.7–2.4)

## 2018-06-05 LAB — SAMPLE TO BLOOD BANK

## 2018-06-05 LAB — SARS CORONAVIRUS 2 BY RT PCR (HOSPITAL ORDER, PERFORMED IN ~~LOC~~ HOSPITAL LAB): SARS Coronavirus 2: NEGATIVE

## 2018-06-05 LAB — TROPONIN I: Troponin I: <0.03 ng/mL (ref <0.03)

## 2018-06-05 LAB — CALCIUM, IONIZED: Calcium, Ionized, Serum: 6.9 mg/dL — ABNORMAL HIGH (ref 4.5–5.6)

## 2018-06-05 LAB — LACTIC ACID, PLASMA: Lactic Acid, Venous: 1.3 mmol/L (ref 0.5–1.9)

## 2018-06-05 LAB — LIPASE, BLOOD: Lipase: 30 U/L (ref 11–51)

## 2018-06-05 MED ORDER — POTASSIUM CHLORIDE 10 MEQ/100ML IV SOLN
10.0000 meq | Freq: Once | INTRAVENOUS | Status: AC
  Administered 2018-06-05: 10 meq via INTRAVENOUS
  Filled 2018-06-05: qty 100

## 2018-06-05 MED ORDER — POTASSIUM CHLORIDE CRYS ER 20 MEQ PO TBCR
30.0000 meq | EXTENDED_RELEASE_TABLET | Freq: Once | ORAL | Status: AC
  Administered 2018-06-05: 30 meq via ORAL
  Filled 2018-06-05: qty 1

## 2018-06-05 MED ORDER — TAMSULOSIN HCL 0.4 MG PO CAPS
0.4000 mg | ORAL_CAPSULE | Freq: Every day | ORAL | Status: DC
  Administered 2018-06-06 – 2018-06-08 (×3): 0.4 mg via ORAL
  Filled 2018-06-05 (×3): qty 1

## 2018-06-05 MED ORDER — LEVOTHYROXINE SODIUM 75 MCG PO TABS
150.0000 ug | ORAL_TABLET | Freq: Every day | ORAL | Status: DC
  Administered 2018-06-06 – 2018-06-09 (×4): 150 ug via ORAL
  Filled 2018-06-05 (×4): qty 2

## 2018-06-05 MED ORDER — SODIUM CHLORIDE 0.9 % IV SOLN
INTRAVENOUS | Status: DC
  Administered 2018-06-05 – 2018-06-06 (×2): via INTRAVENOUS

## 2018-06-05 MED ORDER — SODIUM CHLORIDE 0.9 % IV BOLUS
250.0000 mL | Freq: Once | INTRAVENOUS | Status: AC
  Administered 2018-06-05: 250 mL via INTRAVENOUS

## 2018-06-05 MED ORDER — SODIUM CHLORIDE 0.9 % IV BOLUS
250.0000 mL | Freq: Once | INTRAVENOUS | Status: AC
  Administered 2018-06-05: 20:00:00 250 mL via INTRAVENOUS

## 2018-06-05 MED ORDER — METOPROLOL SUCCINATE ER 25 MG PO TB24
25.0000 mg | ORAL_TABLET | Freq: Every day | ORAL | Status: DC
  Administered 2018-06-06 – 2018-06-09 (×4): 25 mg via ORAL
  Filled 2018-06-05 (×4): qty 1

## 2018-06-05 MED ORDER — SODIUM CHLORIDE 0.9 % IV SOLN
INTRAVENOUS | Status: DC
  Administered 2018-06-05: 20:00:00 via INTRAVENOUS

## 2018-06-05 NOTE — ED Triage Notes (Signed)
Pt states he has been having diarrhea for several days and today his sister noted some blood in it.  Seen yesterday for same.  Sister states he is now incontinent of stool.  Pt does live alone.,

## 2018-06-05 NOTE — ED Provider Notes (Signed)
[facility] EMERGENCY DEPARTMENT Provider Note   CSN: [id number] Arrival date & time: 06/05/18  1734    History   Chief Complaint Chief Complaint  Patient presents with   Diarrhea    HPI Paul Bradley is a 73 y.o. male.      Diarrhea    Pt was seen at 1755. Per pt, c/o gradual onset and persistence of multiple intermittent episodes of diarrhea for the past 5 days. Describes the stools as "loose," "watery," and "red." Has been associated with generalized weakness. Pt states he was evaluated in the ED yesterday for generalized weakness, and the day before at his Heme/Onc MD's office for weakness, but he "forgot to mention the diarrhea." Pt apparently lives alone.  Denies fevers, known COVID+ exposure. Denies abd pain, no CP/SOB, no back pain, no black stools, no N/V, no focal motor weakness.      Past Medical History:  Diagnosis Date   Abscess of left hip    "healing wound" per [facility] Heme/Onc MD note 06/03/18   Assistance needed for mobility    walker   Atrial fibrillation ([place])    BPH (benign prostatic hyperplasia)    Cancer ([place])    thyroid   Chronic anticoagulation    Coronary artery disease    Diabetes mellitus without complication ([place])    Hematochezia 05/05/2018   colonoscopy at [facility] with diverticulosis    Hypertension    Thyroid disease     There are no active problems to display for this patient.   Past Surgical History:  Procedure Laterality Date   THYROIDECTOMY          Home Medications    Prior to Admission medications   Medication Sig Start Date End Date Taking? Authorizing Provider  apixaban (ELIQUIS) 5 MG TABS tablet Take 5 mg by mouth 2 (two) times daily.    [provider]  Ascorbic Acid (VITAMIN C) 1000 MG tablet Take 1,000 mg by mouth daily.    [provider]  calcitRIOL (ROCALTROL) 0.5 MCG capsule Take 0.5 mcg by mouth 2 (two) times daily.    [provider]  hydrochlorothiazide (HYDRODIURIL)  25 MG tablet Take 25 mg by mouth daily.    [provider]  levothyroxine (SYNTHROID, LEVOTHROID) 150 MCG tablet Take 150 mcg by mouth daily before breakfast.    [provider]  lisinopril (PRINIVIL,ZESTRIL) 10 MG tablet Take 10 mg by mouth daily.    [provider]  metoprolol succinate (TOPROL-XL) 25 MG 24 hr tablet Take 25 mg by mouth daily.    [provider]  tamsulosin (FLOMAX) 0.4 MG CAPS capsule Take 0.4 mg by mouth.    [provider]    Family History History reviewed. No pertinent family history.  Social History Social History   Tobacco Use   Smoking status: Never Smoker   Smokeless tobacco: Never Used  Substance Use Topics   Alcohol use: Never    Frequency: Never   Drug use: Never     Allergies   Penicillins   Review of Systems Review of Systems  Gastrointestinal: Positive for diarrhea.   ROS: Statement: All systems negative except as marked or noted in the HPI; Constitutional: Negative for fever and chills. +generalized weakness.; ; Eyes: Negative for eye pain, redness and discharge. ; ; ENMT: Negative for ear pain, hoarseness, nasal congestion, sinus pressure and sore throat. ; ; Cardiovascular: Negative for chest pain, palpitations, diaphoresis, dyspnea and peripheral edema. ; ; Respiratory: Negative for cough,  wheezing and stridor. ; ; Gastrointestinal: Negative for nausea, vomiting, abdominal pain, hematemesis, jaundice and +diarrhea, rectal bleeding. . ; ; Genitourinary: Negative for dysuria, flank pain and hematuria. ; ; Musculoskeletal: Negative for back pain and neck pain. Negative for swelling and trauma.; ; Skin: Negative for pruritus, rash, abrasions, blisters, bruising and skin lesion.; ; Neuro: Negative for headache, lightheadedness and neck stiffness. Negative for altered level of consciousness, altered mental status, extremity weakness, paresthesias, involuntary movement, seizure and syncope.        Physical Exam Updated Vital Signs BP 124/89 (BP Location: Right Arm)    Pulse 93    Temp 97.6 F (36.4 C) (Oral)    Resp 16    Ht 6' (1.829 m)    Wt 71 kg    SpO2 99%    BMI 21.23 kg/m    Patient Vitals for the past 24 hrs:  BP Temp Temp src Pulse Resp SpO2 Height Weight  06/05/18 2130 140/85 -- -- 99 16 97 % -- --  06/05/18 2115 -- -- -- 96 (!) 21 93 % -- --  06/05/18 2100 (!) 152/92 -- -- 89 19 98 % -- --  06/05/18 2000 138/88 -- -- -- 18 96 % -- --  06/05/18 1930 (!) 154/98 -- -- (!) 104 18 94 % -- --  06/05/18 1915 -- -- -- 95 -- 100 % -- --  06/05/18 1900 (!) 144/88 -- -- (!) 106 15 100 % -- --  06/05/18 1735 124/89 97.6 F (36.4 C) Oral 93 16 99 % 6' (1.829 m) 71 kg    18:14 Orthostatic Vital Signs TE  Orthostatic Lying   BP- Lying: 137/90  Pulse- Lying: 92      Orthostatic Sitting  BP- Sitting: 132/89  Pulse- Sitting: 101      Orthostatic Standing at 0 minutes  BP- Standing at 0 minutes: 102/68  Pulse- Standing at 0 minutes: 104     Physical Exam 1800: Physical examination:  Nursing notes reviewed; Vital signs and O2 SAT reviewed;  Constitutional: Disheveled. In no acute distress; Head:  Normocephalic, atraumatic; Eyes: EOMI, PERRL, No scleral icterus; ENMT: Mouth and pharynx normal, Mucous membranes dry; Neck: Supple, Full range of motion, No lymphadenopathy; Cardiovascular:   Irregular rate and rhythm, No gallop; Respiratory: Breath sounds clear & equal bilaterally, No wheezes.  Speaking full sentences with ease, Normal respiratory effort/excursion; Chest: Nontender, Movement normal; Abdomen: Soft, +diffuse tenderness to palp. No rebound or guarding. Nondistended, Normal bowel sounds. Rectal exam performed w/permission of pt and ED RN chaperone present.  Anal tone normal.  Non-tender, soft maroon stool and mucus in rectal vault, heme positive. +skin of entire external rectal area and buttocks excoriated and raw with dried feces on buttocks. No palp fecal  impaction.  No palp masses.;;; Genitourinary: No CVA tenderness. +foley draining cloudy yellow urine.; Extremities: Peripheral pulses normal, +healing shallow round wound left hip, no bleeding, no redness. No tenderness, +1 RLE edema pt states is chronic. No calf tenderness, +asymmetry.; Neuro: AA&Ox3, rambling/tangenial historian. No facial droop. Speech slow but clear. Grips equal. No gross focal motor deficits in extremities.; Skin: Color normal, Warm, Dry.      ED Treatments / Results  Labs (all labs ordered are listed, but only abnormal results are displayed)   EKG EKG Interpretation  Date/Time:  Sunday Jun 05 2018 18:14:45 EDT Ventricular Rate:  87 PR Interval:    QRS Duration: 95 QT Interval:  362 QTC Calculation: 436 R Axis:   69  Text Interpretation:  Atrial fibrillation When compared with ECG of 06/04/2018 Rate faster Otherwise no significant change Confirmed by Francine Graven [phone]) on 06/05/2018 6:56:12 PM   Radiology   Procedures Procedures (including critical care time)  Medications Ordered in ED Medications - No data to display   Initial Impression / Assessment and Plan / ED Course  I have reviewed the triage vital signs and the nursing notes.  Pertinent labs & imaging results that were available during my care of the patient were reviewed by me and considered in my medical decision making (see chart for details).     MDM Reviewed: previous chart, nursing note and vitals Reviewed previous: labs and ECG Interpretation: labs, ECG, x-ray and CT scan   Results for orders placed or performed during the hospital encounter of 06/05/18  SARS Coronavirus 2 (CEPHEID - Performed in [place] hospital lab), [facility] Order  Result Value Ref Range   SARS Coronavirus 2 NEGATIVE NEGATIVE  Comprehensive metabolic panel  Result Value Ref Range   Sodium 130 (L) 135 - 145 mmol/L   Potassium 3.0 (L) 3.5 - 5.1 mmol/L   Chloride 91 (L) 98 - 111 mmol/L   CO2 29 22 - 32  mmol/L   Glucose, Bld 113 (H) 70 - 99 mg/dL   BUN 30 (H) 8 - 23 mg/dL   Creatinine, Ser 1.26 (H) 0.61 - 1.24 mg/dL   Calcium 10.7 (H) 8.9 - 10.3 mg/dL   Total Protein 6.5 6.5 - 8.1 g/dL   Albumin 3.0 (L) 3.5 - 5.0 g/dL   AST 37 15 - 41 U/L   ALT 18 0 - 44 U/L   Alkaline Phosphatase 40 38 - 126 U/L   Total Bilirubin 1.0 0.3 - 1.2 mg/dL   GFR calc non Af Amer 56 (L) >60 mL/min   GFR calc Af Amer >60 >60 mL/min   Anion gap 10 5 - 15  Troponin I - Once  Result Value Ref Range   Troponin I <0.03 <0.03 ng/mL  Lactic acid, plasma  Result Value Ref Range   Lactic Acid, Venous 1.3 0.5 - 1.9 mmol/L  CBC with Differential  Result Value Ref Range   WBC 9.6 4.0 - 10.5 K/uL   RBC 3.74 (L) 4.22 - 5.81 MIL/uL   Hemoglobin 11.8 (L) 13.0 - 17.0 g/dL   HCT 35.0 (L) 39.0 - 52.0 %   MCV 93.6 80.0 - 100.0 fL   MCH 31.6 26.0 - 34.0 pg   MCHC 33.7 30.0 - 36.0 g/dL   RDW 15.0 11.5 - 15.5 %   Platelets 240 150 - 400 K/uL   nRBC 0.0 0.0 - 0.2 %   Neutrophils Relative % 76 %   Neutro Abs 7.2 1.7 - 7.7 K/uL   Lymphocytes Relative 14 %   Lymphs Abs 1.3 0.7 - 4.0 K/uL   Monocytes Relative 10 %   Monocytes Absolute 1.0 0.1 - 1.0 K/uL   Eosinophils Relative 0 %   Eosinophils Absolute 0.0 0.0 - 0.5 K/uL   Basophils Relative 0 %   Basophils Absolute 0.0 0.0 - 0.1 K/uL   Immature Granulocytes 0 %   Abs Immature Granulocytes 0.03 0.00 - 0.07 K/uL  Protime-INR  Result Value Ref Range   Prothrombin Time 15.3 (H) 11.4 - 15.2 seconds   INR 1.2 0.8 - 1.2  Urinalysis, Routine w reflex microscopic  Result Value Ref Range   Color, Urine YELLOW YELLOW   APPearance HAZY (A) CLEAR   Specific Gravity, Urine  1.013 1.005 - 1.030   pH 5.0 5.0 - 8.0   Glucose, UA NEGATIVE NEGATIVE mg/dL   Hgb urine dipstick LARGE (A) NEGATIVE   Bilirubin Urine NEGATIVE NEGATIVE   Ketones, ur NEGATIVE NEGATIVE mg/dL   Protein, ur NEGATIVE NEGATIVE mg/dL   Nitrite NEGATIVE NEGATIVE   Leukocytes,Ua TRACE (A) NEGATIVE   RBC /  HPF >50 (H) 0 - 5 RBC/hpf   WBC, UA 6-10 0 - 5 WBC/hpf   Bacteria, UA RARE (A) NONE SEEN   Mucus PRESENT    Hyaline Casts, UA PRESENT    Ca Oxalate Crys, UA PRESENT   Lipase, blood  Result Value Ref Range   Lipase 30 11 - 51 U/L  Magnesium  Result Value Ref Range   Magnesium 1.8 1.7 - 2.4 mg/dL  POC occult blood, ED  Result Value Ref Range   Fecal Occult Bld POSITIVE (A) NEGATIVE  Sample to Blood Bank  Result Value Ref Range   Blood Bank Specimen SAMPLE AVAILABLE FOR TESTING    Sample Expiration      06/07/2018,2359 Performed at [facility], [address], [place], [place] [address]    Ct Abdomen Pelvis Wo Contrast Result Date: 06/05/2018 CLINICAL DATA:  Diarrhea and abdominal pain EXAM: CT ABDOMEN AND PELVIS WITHOUT CONTRAST TECHNIQUE: Multidetector CT imaging of the abdomen and pelvis was performed following the standard protocol without IV contrast. COMPARISON:  11/06/2009 FINDINGS: Lower chest: Lungs are well aerated bilaterally. There is a 11 mm nodule identified in the right lower lobe. Additionally a 10 mm subpleural nodule is noted in the left lower lobe. Hepatobiliary: Cholelithiasis is noted without complicating factors. The liver is within normal limits. Pancreas: Unremarkable. No pancreatic ductal dilatation or surrounding inflammatory changes. Spleen: Normal in size without focal abnormality. Adrenals/Urinary Tract: Adrenal glands are within normal limits. Kidneys are well visualized bilaterally. Few scattered hypodensities are noted likely representing cysts but incompletely evaluated on this exam. No renal calculi or obstructive changes are seen. The bladder is decompressed secondary to a Foley catheter. Wall thickening is seen consistent with the decompression. Stomach/Bowel: Changes consistent with rectal impaction are noted. Some very mild Peri rectal inflammatory changes are seen consistent with stercoral colitis. The more proximal colon shows some retained fecal material  although no other changes of constipation are seen. The appendix is within normal limits. Small bowel and stomach are unremarkable. Vascular/Lymphatic: Aortic atherosclerosis. No enlarged abdominal or pelvic lymph nodes. Reproductive: Prostate is unremarkable. Other: No abdominal wall hernia or abnormality. No abdominopelvic ascites. Musculoskeletal: Degenerative changes of lumbar spine are noted. IMPRESSION: Bilateral lower lobe nodules measuring up to 11 mm. The possibility of metastatic disease deserves consideration given a history of prior adenocarcinoma. Non-contrast chest CT at 3-6 months is recommended. If the nodules are stable at time of repeat CT, then future CT at 18-24 months (from today's scan) is considered optional for low-risk patients, but is recommended for high-risk patients. This recommendation follows the consensus statement: Guidelines for Management of Incidental Pulmonary Nodules Detected on CT Images: From the Fleischner Society 2017; Radiology 2017; 284:228-243. 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 Cholelithiasis without complicating factors. Electronically Signed   By: Inez Catalina M.D.   On: 06/05/2018 20:45   Dg Chest 1 View Result Date: 06/05/2018 CLINICAL DATA:  Diarrhea. EXAM: CHEST  1 VIEW COMPARISON:  06/04/2018 FINDINGS: Low lung volumes. The cardio pericardial silhouette is enlarged. Similar prominence of the right paratracheal stripe. Interstitial markings are diffusely coarsened with chronic features. Prominent skin  fold noted over the left hemithorax. No focal consolidation or pleural effusion. The visualized bony structures of the thorax are intact. Telemetry leads overlie the chest. IMPRESSION: Low volume film without acute cardiopulmonary findings. Stable appearance of prominent right paratracheal opacity, potentially related to ectatic arch vessel anatomy and/or thyroid enlargement. Lymphadenopathy could also have this appearance. Electronically Signed    By: Misty Stanley M.D.   On: 06/05/2018 20:40   Ct Head Wo Contrast Result Date: 06/05/2018 CLINICAL DATA:  Incontinence EXAM: CT HEAD WITHOUT CONTRAST TECHNIQUE: Contiguous axial images were obtained from the base of the skull through the vertex without intravenous contrast. COMPARISON:  None. FINDINGS: Brain: Chronic white matter ischemic changes are noted bilaterally. No findings to suggest acute hemorrhage, acute infarction or space-occupying mass lesion are seen. Vascular: No hyperdense vessel or unexpected calcification. Skull: Normal. Negative for fracture or focal lesion. Sinuses/Orbits: No acute finding. Other: None. IMPRESSION: Chronic white matter ischemic changes. No acute abnormality is noted. Electronically Signed   By: Inez Catalina M.D.   On: 06/05/2018 20:39     CULLEN LAHAIE was evaluated in Emergency Department on 06/05/2018 for the symptoms described in the history of present illness. He was evaluated in the context of the global COVID-19 pandemic, which necessitated consideration that the patient might be at risk for infection with the SARS-CoV-2 virus that causes COVID-19. Institutional protocols and algorithms that pertain to the evaluation of patients at risk for COVID-19 are in a state of rapid change based on information released by regulatory bodies including the CDC and federal and state organizations. These policies and algorithms were followed during the patient's care in the ED.     2100:  Pt orthostatic on VS; judicious IVF given. H/H lower than previous but no clear indication for transfusion needed at this time; T&S ordered. Pt with gross blood in rectal vault, no fecal impaction I can feel on exam. Pt has not stooled while in the ED.  Pt does not want to go back to [facility] and wants to stay at [facility]. No clear indication for higher level of care at this point. This is pt's 3rd contact with medical system in 3 days and pt lives alone; will admit. T/C returned from GI Dr.  Oneida Alar, case discussed, including:  HPI, pertinent PM/SHx, VS/PE, dx testing, ED course and treatment:  Agreeable to consult if pt does want to stay at [facility] vs transfer to [facility].   2140:  T/C returned from [facility]. Cruzita Lederer, case discussed, including:  HPI, pertinent PM/SHx, VS/PE, dx testing, ED course and treatment:  Agreeable to come to ED for evaluation for admission.      Final Clinical Impressions(s) / ED Diagnoses   Final diagnoses:  None    ED Discharge Orders    None       Francine Graven, DO 06/10/18 [id number]

## 2018-06-05 NOTE — ED Notes (Addendum)
CONTACT INFO:   Abigail Miyamoto (sister): [phone]

## 2018-06-05 NOTE — H&P (Signed)
History and Physical    Paul Bradley ZTI:[id number] DOB: [birth year] DOA: 06/05/2018  I have briefly reviewed the patient's prior medical records in [place]  PCP: [name], [name], MD  Patient coming from: Home  Chief Complaint: Generalized weakness  HPI: Paul Bradley is a 73 y.o. male with medical history significant of papillary thyroid cancer followed at [facility], permanent A. fib on chronic anticoagulation with Eliquis, CAD, type 2 diabetes mellitus, hypertension, hypothyroidism, who presents to the hospital with complaints of generalized weakness.  Patient is an extremely poor historian but he tells me that he was seen at [facility] 2 days ago emergency room yesterday and today, complains of persistent weakness.  He is barely able to get out of bed and walk.  He also has noticed several loose bloody bowel movements, however none today.  He was admitted in April 2020 at [facility] for hematochezia and hypocalcemia, and underwent a colonoscopy on 4/23 with evidence of diverticulosis but no active bleeding or masses.  There was concern for diffuse dilatation of the cecum on imaging.  Patient denies any fever or chills, denies any chest pain, denies any vomiting but does complain of a very poor appetite.  ED Course: In the ED's sodium is 130 decreased from 139 yesterday, creatinine 1.26, BUN 30.  Calcium is 10.7.  CBC shows a hemoglobin of 11.8 from 13.6 yesterday.  CT scan of the abdomen pelvis showed changes of stercoral colitis and rectal impaction, EDP did not feel the impaction on digital rectal exam. Dr. Oneida Alar from gastroenterology was consulted by EDP and we are asked to admit for generalized weakness, concern for GI bleed in a patient on anticoagulation  Review of Systems: As per HPI otherwise 10 point review of systems negative.   Past Medical History:  Diagnosis Date  . Abscess of left hip    "healing wound" per [facility] Heme/Onc MD note 06/03/18  . Assistance needed for  mobility    walker  . Atrial fibrillation ([place])   . BPH (benign prostatic hyperplasia)   . Cancer ([place])    thyroid  . Chronic anticoagulation   . Coronary artery disease   . Diabetes mellitus without complication ([place])   . Hematochezia 05/05/2018   colonoscopy at [facility] with diverticulosis   . Hypertension   . Thyroid disease     Past Surgical History:  Procedure Laterality Date  . THYROIDECTOMY       reports that he has never smoked. He has never used smokeless tobacco. He reports that he does not drink alcohol or use drugs.  Allergies  Allergen Reactions  . Penicillins Rash    Did it involve swelling of the face/tongue/throat, SOB, or low BP? Unknown Did it involve sudden or severe rash/hives, skin peeling, or any reaction on the inside of your mouth or nose? Unknown Did you need to seek medical attention at a hospital or doctor's office? Unknown When did it last happen? If all above answers are "NO", may proceed with cephalosporin use.     History reviewed. No pertinent family history.  Prior to Admission medications   Medication Sig Start Date End Date Taking? Authorizing Provider  apixaban (ELIQUIS) 5 MG TABS tablet Take 5 mg by mouth 2 (two) times daily.   Yes [provider]  bacitracin 500 UNIT/GM ointment Apply 1 application topically 2 (two) times daily.   Yes [provider]  calcitRIOL (ROCALTROL) 0.5 MCG capsule Take 0.5 mcg by mouth 2 (two) times daily.  Yes [provider]  calcium carbonate (TUMS - DOSED IN MG ELEMENTAL CALCIUM) 500 MG chewable tablet Chew 1 tablet by mouth 5 (five) times daily as needed for indigestion or heartburn.   Yes [provider]  cholecalciferol (VITAMIN D3) 25 MCG (1000 UT) tablet Take 1,000 Units by mouth daily.   Yes [provider]  Lenvatinib Mesylate (LENVIMA, 10 MG DAILY DOSE, PO) Take 10 mg by mouth daily.   Yes [provider]  levothyroxine (SYNTHROID,  LEVOTHROID) 150 MCG tablet Take 150 mcg by mouth daily before breakfast.   Yes [provider]  lisinopril (ZESTRIL) 5 MG tablet Take 5 mg by mouth daily.    Yes [provider]  metoprolol succinate (TOPROL-XL) 25 MG 24 hr tablet Take 25 mg by mouth daily.   Yes [provider]  nystatin (NYSTATIN) powder Apply 1 g topically 4 (four) times daily.   Yes [provider]  senna-docusate (SENNA-PLUS) 8.6-50 MG tablet Take 1 tablet by mouth daily.   Yes [provider]  tamsulosin (FLOMAX) 0.4 MG CAPS capsule Take 0.4 mg by mouth daily.    Yes [provider]  Ascorbic Acid (VITAMIN C) 1000 MG tablet Take 1,000 mg by mouth daily.    [provider]  doxycycline (VIBRAMYCIN) 100 MG capsule  05/22/18   [provider]  hydrochlorothiazide (HYDRODIURIL) 25 MG tablet Take 25 mg by mouth daily.    [provider]  ondansetron (ZOFRAN-ODT) 8 MG disintegrating tablet  04/21/18   [provider]    Physical Exam: Vitals:   06/05/18 2000 06/05/18 2100 06/05/18 2115 06/05/18 2130  BP: 138/88 (!) 152/92  140/85  Pulse:  89 96 99  Resp: 18 19 (!) 21 16  Temp:      TempSrc:      SpO2: 96% 98% 93% 97%  Weight:      Height:        Constitutional: NAD Eyes: PERRL, lids and conjunctivae normal ENMT: Mucous membranes are dry Neck: normal, supple Respiratory: clear to auscultation bilaterally, no wheezing, no crackles. Normal respiratory effort.  Cardiovascular: Regular rate and rhythm, no murmurs / rubs / gallops. No extremity edema. 2+ pedal pulses.  Abdomen: no tenderness, no masses palpated. Bowel sounds positive.  Musculoskeletal: no clubbing / cyanosis.  Skin: no rashes, lesions, ulcers.  Neurologic: CN 2-12 grossly intact. Strength 5/5 in all 4.  Psychiatric: Normal judgment and insight. Alert and oriented x 3. Normal mood.   Labs on Admission: I have personally reviewed following labs and imaging studies   CBC: Recent Labs  Lab 06/04/18 0900 06/05/18 1850  WBC 6.7 9.6  NEUTROABS 4.9 7.2  HGB 13.6 11.8*  HCT 41.2 35.0*  MCV 93.6 93.6  PLT 263 403   Basic Metabolic Panel: Recent Labs  Lab 06/04/18 0900 06/05/18 1850  NA 139 130*  K 3.5 3.0*  CL 98 91*  CO2 30 29  GLUCOSE 105* 113*  BUN 28* 30*  CREATININE 1.18 1.26*  CALCIUM 12.1* 10.7*  MG 2.0 1.8   GFR: Estimated Creatinine Clearance: 52.4 mL/min (A) (by C-G formula based on SCr of 1.26 mg/dL (H)). Liver Function Tests: Recent Labs  Lab 06/04/18 0900 06/05/18 1850  AST 40 37  ALT 19 18  ALKPHOS 43 40  BILITOT 0.8 1.0  PROT 7.6 6.5  ALBUMIN 3.3* 3.0*   Recent Labs  Lab 06/05/18 1850  LIPASE 30   No results for input(s): AMMONIA in the last 168 hours.  Coagulation Profile: Recent Labs  Lab 06/05/18 1850  INR 1.2   Cardiac Enzymes: Recent Labs  Lab 06/05/18 1850  TROPONINI <0.03   BNP (last 3 results) No results for input(s): PROBNP in the last 8760 hours. HbA1C: No results for input(s): HGBA1C in the last 72 hours. CBG: No results for input(s): GLUCAP in the last 168 hours. Lipid Profile: No results for input(s): CHOL, HDL, LDLCALC, TRIG, CHOLHDL, LDLDIRECT in the last 72 hours. Thyroid Function Tests: No results for input(s): TSH, T4TOTAL, FREET4, T3FREE, THYROIDAB in the last 72 hours. Anemia Panel: No results for input(s): VITAMINB12, FOLATE, FERRITIN, TIBC, IRON, RETICCTPCT in the last 72 hours. Urine analysis:    Component Value Date/Time   COLORURINE YELLOW 06/05/2018 1820   APPEARANCEUR HAZY (A) 06/05/2018 1820   LABSPEC 1.013 06/05/2018 1820   PHURINE 5.0 06/05/2018 1820   GLUCOSEU NEGATIVE 06/05/2018 1820   HGBUR LARGE (A) 06/05/2018 1820   BILIRUBINUR NEGATIVE 06/05/2018 1820   KETONESUR NEGATIVE 06/05/2018 1820   PROTEINUR NEGATIVE 06/05/2018 1820   NITRITE NEGATIVE 06/05/2018 1820   LEUKOCYTESUR TRACE (A) 06/05/2018 1820     Radiological Exams on Admission: Ct Abdomen  Pelvis Wo Contrast  Result Date: 06/05/2018 CLINICAL DATA:  Diarrhea and abdominal pain EXAM: CT ABDOMEN AND PELVIS WITHOUT CONTRAST TECHNIQUE: Multidetector CT imaging of the abdomen and pelvis was performed following the standard protocol without IV contrast. COMPARISON:  11/06/2009 FINDINGS: Lower chest: Lungs are well aerated bilaterally. There is a 11 mm nodule identified in the right lower lobe. Additionally a 10 mm subpleural nodule is noted in the left lower lobe. Hepatobiliary: Cholelithiasis is noted without complicating factors. The liver is within normal limits. Pancreas: Unremarkable. No pancreatic ductal dilatation or surrounding inflammatory changes. Spleen: Normal in size without focal abnormality. Adrenals/Urinary Tract: Adrenal glands are within normal limits. Kidneys are well visualized bilaterally. Few scattered hypodensities are noted likely representing cysts but incompletely evaluated on this exam. No renal calculi or obstructive changes are seen. The bladder is decompressed secondary to a Foley catheter. Wall thickening is seen consistent with the decompression. Stomach/Bowel: Changes consistent with rectal impaction are noted. Some very mild Peri rectal inflammatory changes are seen consistent with stercoral colitis. The more proximal colon shows some retained fecal material although no other changes of constipation are seen. The appendix is within normal limits. Small bowel and stomach are unremarkable. Vascular/Lymphatic: Aortic atherosclerosis. No enlarged abdominal or pelvic lymph nodes. Reproductive: Prostate is unremarkable. Other: No abdominal wall hernia or abnormality. No abdominopelvic ascites. Musculoskeletal: Degenerative changes of lumbar spine are noted. IMPRESSION: Bilateral lower lobe nodules measuring up to 11 mm. The possibility of metastatic disease deserves consideration given a history of prior adenocarcinoma. Non-contrast chest CT at 3-6 months is recommended. If the  nodules are stable at time of repeat CT, then future CT at 18-24 months (from today's scan) is considered optional for low-risk patients, but is recommended for high-risk patients. This recommendation follows the consensus statement: Guidelines for Management of Incidental Pulmonary Nodules Detected on CT Images: From the Fleischner Society 2017; Radiology 2017; 284:228-243. 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 Cholelithiasis without complicating factors. Electronically Signed   By: Inez Catalina M.D.   On: 06/05/2018 20:45   Dg Chest 1 View  Result Date: 06/05/2018 CLINICAL DATA:  Diarrhea. EXAM: CHEST  1 VIEW COMPARISON:  06/04/2018 FINDINGS: Low lung volumes. The cardio pericardial silhouette is enlarged. Similar prominence of the right paratracheal stripe. Interstitial markings are diffusely coarsened with chronic features.  Prominent skin fold noted over the left hemithorax. No focal consolidation or pleural effusion. The visualized bony structures of the thorax are intact. Telemetry leads overlie the chest. IMPRESSION: Low volume film without acute cardiopulmonary findings. Stable appearance of prominent right paratracheal opacity, potentially related to ectatic arch vessel anatomy and/or thyroid enlargement. Lymphadenopathy could also have this appearance. Electronically Signed   By: Misty Stanley M.D.   On: 06/05/2018 20:40   Ct Head Wo Contrast  Result Date: 06/05/2018 CLINICAL DATA:  Incontinence EXAM: CT HEAD WITHOUT CONTRAST TECHNIQUE: Contiguous axial images were obtained from the base of the skull through the vertex without intravenous contrast. COMPARISON:  None. FINDINGS: Brain: Chronic white matter ischemic changes are noted bilaterally. No findings to suggest acute hemorrhage, acute infarction or space-occupying mass lesion are seen. Vascular: No hyperdense vessel or unexpected calcification. Skull: Normal. Negative for fracture or focal lesion. Sinuses/Orbits: No acute finding.  Other: None. IMPRESSION: Chronic white matter ischemic changes. No acute abnormality is noted. Electronically Signed   By: Inez Catalina M.D.   On: 06/05/2018 20:39   Dg Chest Port 1 View  Result Date: 06/04/2018 CLINICAL DATA:  Weakness.  Thyroid cancer. EXAM: PORTABLE CHEST 1 VIEW COMPARISON:  02/06/2018 FINDINGS: Heart size upper normal. Negative for heart failure. No infiltrate or effusion Surgical clips in the region of the thyroid. Prominent soft tissue on both sides of the trachea could represent substernal goiter or adenopathy. Note is made of goiter on the CT of 11/06/2009 however there has been interval thyroidectomy. IMPRESSION: Prominent superior mediastinal soft tissue compatible with goiter or adenopathy. Recommend CT neck and chest with contrast. Electronically Signed   By: Franchot Gallo M.D.   On: 06/04/2018 09:01    EKG: Independently reviewed.  A. fib  Assessment/Plan Active Problems:   Generalized weakness   Principal Problem Concern for GI bleed -Patient with reports of diarrhea at home with red stools.  He is fecal occult positive.  He dropped roughly 2 points in his hemoglobin compared to yesterday and clinically appears quite dehydrated -We will monitor CBC, gastroenterology consulted.  Hold Eliquis  Active Problems Hyponatremia -Likely in the setting of dehydration, provide IV fluids and repeat in the morning  Hypokalemia -Possibly related to poor p.o. intake as well as home diuretics  Permanent A. fib -Hold Eliquis in the setting of GI bleed until gastroenterology evaluates patient, continue home metoprolol for rate control  Hypertension -Continue home metoprolol, hold lisinopril as well as hydrochlorothiazide  Hypothyroidism -Continue Synthroid  Papillary thyroid cancer -Outpatient management  Generalized weakness -Patient orthostatic in the ED, provide IV fluids overnight, PT consult tomorrow   DVT prophylaxis: SCDs  Code Status: Full code   Family Communication: no family at bedside  Disposition Plan: home vs SNF when ready  Consults called: GI    Marzetta Board, MD, PhD [facility]  Contact via [email or web address]  [facility] Office Info P: [phone]  F: [phone]   06/05/2018, 9:58 PM

## 2018-06-05 NOTE — ED Notes (Signed)
Assisted dr to obtain fecal occult sample. Dried stool noted to perineal area. Performed peri and foley care and applied moisture barrier. Redness and bleeding present. RN notified

## 2018-06-05 NOTE — ED Notes (Signed)
Wound noted to left lateral hip, open yellow drainage noted, dressing from home in place.  Rectal and sacral area red and open area noted, peri-care done by Tiffany, NT and barrier cream applied.

## 2018-06-06 ENCOUNTER — Encounter (HOSPITAL_COMMUNITY): Payer: Self-pay | Admitting: Gastroenterology

## 2018-06-06 DIAGNOSIS — R197 Diarrhea, unspecified: Secondary | ICD-10-CM | POA: Diagnosis not present

## 2018-06-06 DIAGNOSIS — R531 Weakness: Secondary | ICD-10-CM | POA: Diagnosis not present

## 2018-06-06 LAB — COMPREHENSIVE METABOLIC PANEL
ALT: 18 U/L (ref 0–44)
AST: 37 U/L (ref 15–41)
Albumin: 2.7 g/dL — ABNORMAL LOW (ref 3.5–5.0)
Alkaline Phosphatase: 39 U/L (ref 38–126)
Anion gap: 11 (ref 5–15)
BUN: 25 mg/dL — ABNORMAL HIGH (ref 8–23)
CO2: 27 mmol/L (ref 22–32)
Calcium: 10.1 mg/dL (ref 8.9–10.3)
Chloride: 99 mmol/L (ref 98–111)
Creatinine, Ser: 1.03 mg/dL (ref 0.61–1.24)
GFR calc Af Amer: >60 mL/min (ref >60)
GFR calc non Af Amer: >60 mL/min (ref >60)
Glucose, Bld: 91 mg/dL (ref 70–99)
Potassium: 3.1 mmol/L — ABNORMAL LOW (ref 3.5–5.1)
Sodium: 137 mmol/L (ref 135–145)
Total Bilirubin: 0.8 mg/dL (ref 0.3–1.2)
Total Protein: 6.2 g/dL — ABNORMAL LOW (ref 6.5–8.1)

## 2018-06-06 LAB — URINE CULTURE: Culture: NO GROWTH

## 2018-06-06 LAB — CBC
HCT: 37.2 % — ABNORMAL LOW (ref 39.0–52.0)
Hemoglobin: 11.9 g/dL — ABNORMAL LOW (ref 13.0–17.0)
MCH: 30.6 pg (ref 26.0–34.0)
MCHC: 32 g/dL (ref 30.0–36.0)
MCV: 95.6 fL (ref 80.0–100.0)
Platelets: 231 10*3/uL (ref 150–400)
RBC: 3.89 MIL/uL — ABNORMAL LOW (ref 4.22–5.81)
RDW: 15 % (ref 11.5–15.5)
WBC: 8.2 10*3/uL (ref 4.0–10.5)
nRBC: 0 % (ref 0.0–0.2)

## 2018-06-06 MED ORDER — HYDRALAZINE HCL 20 MG/ML IJ SOLN: 10.0000 mg | INTRAMUSCULAR | Status: DC | PRN

## 2018-06-06 MED ORDER — SODIUM CHLORIDE 0.45 % IV SOLN
INTRAVENOUS | Status: AC
  Administered 2018-06-06 (×2): via INTRAVENOUS
  Filled 2018-06-06 (×3): qty 1000

## 2018-06-06 NOTE — Care Management Obs Status (Signed)
[place] NOTIFICATION   Patient Details  Name: ANIS DEGIDIO MRN: [id number] Date of Birth: 1945-08-31   Medicare Observation Status Notification Given:  Yes    Sherald Barge, RN 06/06/2018, 9:10 AM

## 2018-06-06 NOTE — Consult Note (Addendum)
Referring Provider: No ref. provider found Primary Care Physician:  [name], [name], MD Primary Gastroenterologist:  [facility]  Reason for Consultation:  DIARRHEA/?GI BLEED   Impression: ADMITTED WITH DIARRHEA AFTER STARTING DOXYCYCLINE FOR LEFT HIP ABSCESS. LAST DOSE DOXYCYCLINE @MAY  22. HAD FORMED STOOL TODAY. LOOSE STOOL LIKELY DUE TO MEDS, LESS LIKELY C DIFF COLITIS OR FOOD BORNE ILLNESS. CLINICALLY IMPROVED.  Plan: 1. SUPPORTIVE CARE. 2. ADVANCE TO DYSPHAGIA 3 DIET. 3. AWAIT PT EVAL FOR LOWER EXTREMITY WEAKNESS. 4. NO INDICATION FOR ENDOSCOPY AT THIS TIME.    HPI:  Pt being treated for metastatic thyroid cancer. WAS TREATED WITH FOR LEFT HIP ABSCESS WITH DOXYCYCLINE @MAY  12. DEVELOPED DIARRHEA STARTED @MAY  16. AT IT'S WORST PT WAS HAVING 1 BM /DAY. HAD NOCTURNAL STOOLS x 2 IN PAST 2 WEEKS. NO TRAVEL. HAS WELL WATER BUT DRINKS BOTTLED WATER. REPORTS ABX ALWAYS MAKE HIS STOMACH UPSET. 2 MOS AGO HE WAS HAVING NORMAL STOOL. LAST TCS APR 23 FOR RECTAL BLEEDING.  PT DENIES FEVER, CHILLS, BACK PAIN, HEMATOCHEZIA, HEMATEMESIS, nausea, vomiting, melena, CHEST PAIN, SHORTNESS OF BREATH, CHANGE IN BOWEL IN HABITS, constipation, abdominal pain, problems swallowing, OR heartburn or indigestion.  Past Medical History:  Diagnosis Date  . Abscess of left hip    "healing wound" per [facility] Heme/Onc MD note 06/03/18  . Assistance needed for mobility    walker  . Atrial fibrillation ([place])   . BPH (benign prostatic hyperplasia)   . Cancer ([place])    thyroid  . Chronic anticoagulation   . Coronary artery disease   . Diabetes mellitus without complication ([place])   . Hematochezia 05/05/2018   colonoscopy at [facility] with diverticulosis   . Hypertension   . Thyroid disease    Past Surgical History:  Procedure Laterality Date  . THYROIDECTOMY     Prior to Admission medications   Medication Sig Start Date End Date Taking? Authorizing Provider  apixaban (ELIQUIS) 5 MG TABS tablet Take 5 mg  by mouth 2 (two) times daily.   Yes [provider]  Ascorbic Acid (VITAMIN C) 1000 MG tablet Take 1,000 mg by mouth daily.   Yes [provider]  calcitRIOL (ROCALTROL) 0.5 MCG capsule Take 0.5 mcg by mouth 2 (two) times daily.   Yes [provider]  cholecalciferol (VITAMIN D3) 25 MCG (1000 UT) tablet Take 1,000 Units by mouth daily.   Yes [provider]  Lenvatinib Mesylate (LENVIMA, 10 MG DAILY DOSE, PO) Take 10 mg by mouth daily.   Yes [provider]  levothyroxine (SYNTHROID, LEVOTHROID) 150 MCG tablet Take 150 mcg by mouth daily before breakfast.   Yes [provider]  lisinopril (ZESTRIL) 5 MG tablet Take 10 mg by mouth daily.    Yes [provider]  metoprolol succinate (TOPROL-XL) 25 MG 24 hr tablet Take 25 mg by mouth daily.   Yes [provider]  nystatin (NYSTATIN) powder Apply 1 g topically 4 (four) times daily.   Yes [provider]  tamsulosin (FLOMAX) 0.4 MG CAPS capsule Take 0.4 mg by mouth daily.    Yes [provider]  bacitracin 500 UNIT/GM ointment Apply 1 application topically 2 (two) times daily.    [provider]  calcium carbonate (TUMS - DOSED IN MG ELEMENTAL CALCIUM) 500 MG chewable tablet Chew 1 tablet by mouth 5 (five) times daily as needed for indigestion or heartburn.    [provider]  doxycycline (VIBRAMYCIN) 100 MG capsule  05/22/18   [provider]  hydrochlorothiazide (  HYDRODIURIL) 25 MG tablet Take 25 mg by mouth daily.    [provider]  ondansetron (ZOFRAN-ODT) 8 MG disintegrating tablet  04/21/18   [provider]  senna-docusate (SENNA-PLUS) 8.6-50 MG tablet Take 1 tablet by mouth daily.    [provider]    Current Facility-Administered Medications  Medication Dose Route Frequency Provider Last Rate Last Dose  . levothyroxine (SYNTHROID) tablet 150 mcg  150 mcg Oral QAC breakfast Caren Griffins, MD   150  mcg at 06/06/18 0650  . metoprolol succinate (TOPROL-XL) 24 hr tablet 25 mg  25 mg Oral Daily Caren Griffins, MD   25 mg at 06/06/18 0951  . sodium chloride 0.45 % 1,000 mL with potassium chloride 40 mEq infusion   Intravenous Continuous Heath Lark D, DO 75 mL/hr at 06/06/18 0950    . tamsulosin (FLOMAX) capsule 0.4 mg  0.4 mg Oral Daily Caren Griffins, MD   0.4 mg at 06/06/18 6503   Allergies as of 06/05/2018 - Review Complete 06/05/2018  Allergen Reaction Noted  . Penicillins Rash 02/06/2018   Family History  Problem Relation Age of Onset  . Prostate cancer Father   . Colon cancer Neg Hx   . Colon polyps Neg Hx    Social History   Socioeconomic History  . Marital status: Single    Spouse name: Not on file  . Number of children: Not on file  . Years of education: Not on file  . Highest education level: Not on file  Occupational History  . Not on file  Social Needs  . Financial resource strain: Not on file  . Food insecurity:    Worry: Not on file    Inability: Not on file  . Transportation needs:    Medical: Not on file    Non-medical: Not on file  Tobacco Use  . Smoking status: Never Smoker  . Smokeless tobacco: Never Used  Substance and Sexual Activity  . Alcohol use: Never    Frequency: Never  . Drug use: Never  . Sexual activity: Not on file  Lifestyle  . Physical activity:    Days per week: Not on file    Minutes per session: Not on file  . Stress: Not on file  Relationships  . Social connections:    Talks on phone: Not on file    Gets together: Not on file    Attends religious service: Not on file    Active member of club or organization: Not on file    Attends meetings of clubs or organizations: Not on file    Relationship status: Not on file  Other Topics Concern  . Not on file  Social History Narrative   SISTER HELPS HIM CARE FOR HIMSELF. NO KIDS. WORKED AT [employer] TIME UNTIL LAST [employer] 2020.   Review of Systems: PER HPI OTHERWISE  ALL SYSTEMS ARE NEGATIVE.  Vitals: Blood pressure 123/71, pulse 80, temperature 97.8 F (36.6 C), temperature source Oral, resp. rate 17, height 6' (1.829 m), weight 74.5 kg, SpO2 100 %.  Physical Exam: General:   Alert,  Well-developed, well-nourished, pleasant and cooperative in NAD Head:  Normocephalic and atraumatic. Eyes:  Sclera clear, no icterus.   Conjunctiva pink. Mouth:  No lesions, dentition AB normal. Neck:  Supple; no masses, INCISIONS WELL HEALED. Lungs:  Clear throughout to auscultation.   No wheezes. No acute distress. Heart:  Regular rate and rhythm; no murmurs. Abdomen:  Soft, nontender and nondistended. No masses  noted. Normal bowel sounds, without guarding, and without rebound.   Msk:  Symmetrical; Normal posture. Extremities:  Without edema. Neurologic:  Alert and  oriented x4;  NO NEW FOCAL DEFICITS Cervical Nodes:  No significant cervical adenopathy. Psych:  Alert and cooperative. Normal mood and FLAT affect.   Lab Results: Recent Labs    06/04/18 0900 06/05/18 1850 06/06/18 0627  WBC 6.7 9.6 8.2  HGB 13.6 11.8* 11.9*  HCT 41.2 35.0* 37.2*  PLT 263 240 231   BMET Recent Labs    06/05/18 1850 06/06/18 0627  NA 130* 137  K 3.0* 3.1*  CL 91* 99  CO2 29 27  GLUCOSE 113* 91  BUN 30* 25*  CREATININE 1.26* 1.03  CALCIUM 10.7* 10.1   LFT Recent Labs    06/06/18 0627  PROT 6.2*  ALBUMIN 2.7*  AST 37  ALT 18  ALKPHOS 39  BILITOT 0.8     Studies/Results: MAY 24 pCXR: NACPD, CT ABD/PELVIS MAY 24: I PERSONALLY REVIEWED THE CT WITH DR. Cain Saupe PROCTITIS.    LOS: 0 days   Paul Bradley  06/06/2018, 1:30 PM

## 2018-06-06 NOTE — Progress Notes (Signed)
Physical Therapy Evaluation Patient Details Name: Paul Bradley MRN: [id number] DOB: [birth year] Today's Date: 06/06/2018   History of Present Illness  Paul Bradley is a 73 y.o. 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  Patient is an extremely poor historian but he tells me that he was seen at [facility] 2 days ago emergency room yesterday and today, complains of persistent weakness.  He is barely able to get out of bed and walk.  He also has noticed several loose bloody bowel movements, however none today.  He was admitted in April 2020 at [facility] for hematochezia and hypocalcemia, and underwent a colonoscopy on 4/23 with evidence of diverticulosis but no active bleeding or masses.  There was concern for diffuse dilatation of the cecum on imaging.  Patient denies any fever or chills, denies any chest pain, denies any vomiting but does complain of a very poor appetite.    Clinical Impression  Patient found in bed upon entering. Patient did not know he was at [facility] until notified by therapist. Patient required mod A for bed mobility and use of bedrail. Patient required mod A for sit to stand using RW. Once in standing patient demonstrated a posterior lean. Patient ambulated 12 feet with min A and use of RW with VCs and tactile cues for safety and to reduce posterior lean. Patient requested return to bed due to fatigue. Patient's DO entered room and was notified of patient's mobility status. Patient would benefit from continued skilled physical therapy while in the hospital and at recommended venue below in order to improve strength and independence with bed mobility, transfers, ambulation, and overall functional mobility.     Recommended Discharge SNF    Equipment  Recommendations  (Can be determined at next venue of care, would recommend a 5'' wheeled Rolling walker and a BSC depending on if patient does actually have them at home or not if patient was to discharge to home)    Recommendations for Other Services       Precautions / Restrictions Precautions Precautions: Fall Restrictions Weight Bearing Restrictions: No      Mobility  Bed Mobility Overal bed mobility: Needs Assistance Bed Mobility: Rolling;Supine to Sit Rolling: Mod assist   Supine to sit: Mod assist     General bed mobility comments: Patient using bedrail and requiring mod A for supine to sit and with rolling  Transfers Overall transfer level: Needs assistance Equipment used: Rolling walker (2 wheeled) Transfers: Sit to/from Stand Sit to Stand: Mod assist         General transfer comment: Patient required mod A with use of RW to perform sit to from stand, with return to sitting, patient demonstrated poor eccentric control. When initially in standing patient demonstrated posterior lean requiring verbal and tactile cues to correct.   Ambulation/Gait Ambulation/Gait assistance: Min assist Gait Distance (Feet): 12 Feet Assistive device: Rolling walker (2 wheeled) Gait Pattern/deviations: Step-to pattern;Decreased stride length;Decreased step length - right;Decreased step length - left;Decreased stance time - right;Decreased stance time - left Gait velocity: Decreased, intermittent posterior lean with ambulation      Stairs            Wheelchair Mobility    Modified Rankin (Stroke Patients Only)       Balance Overall balance assessment: Needs assistance Sitting-balance support: No  upper extremity supported;Feet supported Sitting balance-Leahy Scale: Good     Standing balance support: Bilateral upper extremity supported Standing balance-Leahy Scale: Poor Standing balance comment: Posterior lean in standing requiring verbal and tactile cues to correct                              Pertinent Vitals/Pain Pain Assessment: No/denies pain    Home Living Family/patient expects to be discharged to:: Private residence Living Arrangements: Alone Available Help at Discharge: Available PRN/intermittently;Family;Other (Comment)(All this history per patient report, unknown if reliable historian) Type of Home: House Home Access: [place]: [address] [place]: Walker - 2 wheels;Grab bars - tub/shower;Bedside commode;Cane - single point Additional Comments: Patient reported having nursing and physical therapy coming to his home 1 or 2 times/week    Prior Function Level of Independence: Needs assistance   Gait / Transfers Assistance Needed: RW to ambulate around home required intermittent assistance           Hand Dominance        Extremity/Trunk Assessment   Upper Extremity Assessment Upper Extremity Assessment: Generalized weakness    Lower Extremity Assessment Lower Extremity Assessment: Generalized weakness    Cervical / Trunk Assessment Cervical / Trunk Assessment: Kyphotic  Communication   Communication: Other (comment)(Intermittent mumbling, but otherwise coherent)  Cognition Arousal/Alertness: Awake/alert Behavior During Therapy: WFL for tasks assessed/performed Overall Cognitive Status: No family/caregiver present to determine baseline cognitive functioning                                 General Comments: Oriented to person, DOB, not oriented to place      General Comments      Exercises     Assessment/Plan    PT Assessment Patient needs continued PT services  PT Problem List Decreased strength;Decreased activity tolerance;Decreased balance;Decreased mobility;Decreased safety awareness       PT Treatment Interventions DME instruction;Gait training;Functional mobility training;Therapeutic activities;Therapeutic exercise;Balance training;Neuromuscular  re-education;Patient/family education    PT Goals (Current goals can be found in the Care Plan section)  Acute Rehab PT Goals Patient Stated Goal: Get stronger PT Goal Formulation: With patient Time For Goal Achievement: 06/13/18 Potential to Achieve Goals: Good    Frequency Min 3X/week   Barriers to discharge        Co-evaluation               AM-PAC PT "6 Clicks" Mobility  Outcome Measure Help needed turning from your back to your side while in a flat bed without using bedrails?: A Lot Help needed moving from lying on your back to sitting on the side of a flat bed without using bedrails?: A Lot Help needed moving to and from a bed to a chair (including a wheelchair)?: A Lot Help needed standing up from a chair using your arms (e.g., wheelchair or bedside chair)?: A Lot Help needed to walk in hospital room?: A Little Help needed climbing 3-5 steps with a railing? : A Lot 6 Click Score: 13    End of Session Equipment Utilized During Treatment: Gait belt Activity Tolerance: Patient tolerated treatment well Patient left: in bed;with SCD's reapplied;with call bell/phone within reach;with bed alarm set(In bed per patient request) Nurse Communication: Mobility status(Discussed with DO) PT Visit Diagnosis: Unsteadiness on feet (R26.81);Other abnormalities of gait and mobility (R26.89);Muscle weakness (generalized) (M62.81)  Time: 4314-2767 PT Time Calculation (min) (ACUTE ONLY): 30 min   Charges:   PT Evaluation $PT Eval Moderate Complexity: 1 Mod         Clarene Critchley PT, DPT 9:09 AM, 06/06/18 [phone]

## 2018-06-06 NOTE — NC FL2 (Signed)
  [place] LEVEL OF CARE SCREENING TOOL     IDENTIFICATION  Patient Name: Paul Bradley Birthdate: October 25, 1945 Sex: male Admission Date (Current Location): 06/05/2018  [facility] and [place] Number:  [employer] and Address:  [facility] [address], [place]      Provider Number: [id number]  Attending Physician Name and Address:  Rodena Goldmann, DO  Relative Name and Phone Number:  Abigail Miyamoto [phone]    Current Level of Care: Hospital Recommended Level of Care: [place] Prior Approval Number:    Date Approved/Denied:   PASRR Number: [id number]  Discharge Plan: SNF    Current Diagnoses: Patient Active Problem List   Diagnosis Date Noted  . Generalized weakness 06/05/2018    Orientation RESPIRATION BLADDER Height & Weight     Self, Time, Situation, Place  Normal Continent Weight: 74.5 kg Height:  6' (182.9 cm)  BEHAVIORAL SYMPTOMS/MOOD NEUROLOGICAL BOWEL NUTRITION STATUS  (na) (na) Continent Diet  AMBULATORY STATUS COMMUNICATION OF NEEDS Skin   Extensive Assist Verbally Other (Comment)(MASD to sacrum; diabetic ulcer on left foot under great toe; third toe on left food red, swollen, warm)                       Personal Care Assistance Level of Assistance  Bathing, Feeding, Dressing Bathing Assistance: Limited assistance Feeding assistance: Independent Dressing Assistance: Limited assistance     Functional Limitations Info  Sight, Hearing, Speech Sight Info: Adequate Hearing Info: Adequate Speech Info: Impaired    SPECIAL CARE FACTORS FREQUENCY  PT (By licensed PT)     PT Frequency: 5 days/week              Contractures Contractures Info: Not present    Additional Factors Info  Code Status, Allergies Code Status Info: full Allergies Info: penicillins           Current Medications (06/06/2018):  This is the current hospital active medication list Current  Facility-Administered Medications  Medication Dose Route Frequency Provider Last Rate Last Dose  . levothyroxine (SYNTHROID) tablet 150 mcg  150 mcg Oral QAC breakfast Caren Griffins, MD   150 mcg at 06/06/18 0650  . metoprolol succinate (TOPROL-XL) 24 hr tablet 25 mg  25 mg Oral Daily Caren Griffins, MD   25 mg at 06/06/18 0951  . sodium chloride 0.45 % 1,000 mL with potassium chloride 40 mEq infusion   Intravenous Continuous Heath Lark D, DO 75 mL/hr at 06/06/18 0950    . tamsulosin (FLOMAX) capsule 0.4 mg  0.4 mg Oral Daily Caren Griffins, MD   0.4 mg at 06/06/18 7062     Discharge Medications: Please see discharge summary for a list of discharge medications.  Relevant Imaging Results:  Relevant Lab Results:   Additional Information SS# [id number] Margretta Sidle, RN

## 2018-06-06 NOTE — Progress Notes (Addendum)
PROGRESS NOTE    Paul Bradley  WNU:[id number] DOB: November 30, 1945 DOA: 06/05/2018 PCP: [name], MD   Brief Narrative:  Per HPI: Paul Bradley is a 73 y.o. 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  Patient is an extremely poor historian but he tells me that he was seen at [facility] 2 days ago emergency room yesterday and today, complains of persistent weakness.  He is barely able to get out of bed and walk.  He also has noticed several loose bloody bowel movements, however none today.  He was admitted in April 2020 at [facility] for hematochezia and hypocalcemia, and underwent a colonoscopy on 4/23 with evidence of diverticulosis but no active bleeding or masses.  There was concern for diffuse dilatation of the cecum on imaging.  Patient denies any fever or chills, denies any chest pain, denies any vomiting but does complain of a very poor appetite.  Patient was admitted with concern for diverticular GI bleed in the setting of Eliquis use.  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  Patient does have periods of increased confusion while he is on his chemotherapy agents for thyroid cancer.  He is also on a chronic Foley catheter for urinary retention issues and is due to have a suprapubic catheter in the near future.  Assessment & Plan:   Active Problems:   Generalized weakness   Acute GI bleed likely hemorrhoidal or diverticular -Continue close monitoring with repeat CBC and hold Eliquis -Appreciate GI evaluation -Recent colonoscopy at [facility] with diverticulosis and hemorrhoids noted -We will need to determine whether Eliquis should be continued in near future   Active Problems Hyponatremia-resolved -Repeat BMP in a.m.  Hypokalemia -Possibly related to poor p.o. intake as well as home diuretics -Persistent and will administer IV fluid infusion with potassium supplementation -Recheck labs in a.m. with magnesium  Permanent A. fib-rate controlled -Hold Eliquis in the setting of GI bleed until gastroenterology evaluates patient, continue home metoprolol for rate control  Hypertension-mildly elevated -Continue home metoprolol -Continue to hold lisinopril and HCTZ for now -We will add hydralazine as needed for severe elevations  Hypothyroidism -Continue Synthroid  Papillary thyroid cancer -Outpatient management -Has episodes of encephalopathy related to chemotherapy agents  Generalized weakness -Will require SNF on discharge per PT evaluation -Sister agreeable to this plan  Urinary retention with chronic Foley catheter -Plans for suprapubic catheter with urology in the outpatient setting soon   DVT prophylaxis:SCDs Code Status: Full Family Communication: Spoke with sister on phone Disposition Plan: Per GI, follow labs, anticipate DC to SNF when ready in 24-48 hours.   Consultants:   GI  Procedures:   None  Antimicrobials:   None   Subjective: Patient seen and evaluated today with no new acute complaints or concerns. No acute concerns or events noted overnight.  He does not appear to quite comprehend what is happening and does not appear to describe any symptomatic concerns.  Objective: Vitals:   06/05/18 2200 06/05/18 2324 06/06/18 0645 06/06/18 1335  BP: (!) 152/102  123/71 (!) 140/92  Pulse: 98  80 86  Resp: 15  17 16   Temp:   97.8 F (36.6 C)   TempSrc:   Oral   SpO2: 99%  100% 100%  Weight:  74.5 kg    Height:  6' (1.829 m)      Intake/Output Summary (Last 24 hours) at 06/06/2018 1417 Last data filed at 06/06/2018 1300 Gross per 24 hour  Intake 985.29 ml  Output 3100 ml  Net -2114.71 ml   Filed  Weights   06/05/18 1735 06/05/18 2324  Weight: 71 kg 74.5 kg    Examination:  General exam: Appears calm and comfortable  Respiratory system: Clear to auscultation. Respiratory effort normal. Cardiovascular system: S1 & S2 heard, RRR. No JVD, murmurs, rubs, gallops or clicks. No pedal edema. Gastrointestinal system: Abdomen is nondistended, soft and nontender. No organomegaly or masses felt. Normal bowel sounds heard. Central nervous system: Alert and awake. Extremities: Symmetric 5 x 5 power. Skin: No rashes, lesions or ulcers Psychiatry: Difficult to assess given patient condition    Data Reviewed: I have personally reviewed following labs and imaging studies  CBC: Recent Labs  Lab 06/04/18 0900 06/05/18 1850 06/06/18 0627  WBC 6.7 9.6 8.2  NEUTROABS 4.9 7.2  --   HGB 13.6 11.8* 11.9*  HCT 41.2 35.0* 37.2*  MCV 93.6 93.6 95.6  PLT 263 240 992   Basic Metabolic Panel: Recent Labs  Lab 06/04/18 0900 06/05/18 1850 06/06/18 0627  NA 139 130* 137  K 3.5 3.0* 3.1*  CL 98 91* 99  CO2 30 29 27   GLUCOSE 105* 113* 91  BUN 28* 30* 25*  CREATININE 1.18 1.26* 1.03  CALCIUM 12.1* 10.7* 10.1  MG 2.0 1.8  --    GFR: Estimated Creatinine Clearance: 67.3 mL/min (by C-G formula based on SCr of 1.03 mg/dL). Liver Function Tests: Recent Labs  Lab 06/04/18 0900 06/05/18 1850 06/06/18 0627  AST 40 37 37  ALT 19 18 18   ALKPHOS 43 40 39  BILITOT 0.8 1.0 0.8  PROT 7.6 6.5 6.2*  ALBUMIN 3.3* 3.0* 2.7*   Recent Labs  Lab 06/05/18 1850  LIPASE 30   No results for input(s): AMMONIA in the last 168 hours. Coagulation Profile: Recent Labs  Lab 06/05/18 1850  INR 1.2   Cardiac Enzymes: Recent Labs  Lab 06/05/18 1850  TROPONINI <0.03   BNP (last 3 results) No results for input(s): PROBNP in the last 8760 hours. HbA1C: No results for input(s): HGBA1C in the last 72 hours. CBG: No results for input(s): GLUCAP in the last 168 hours. Lipid Profile: No results for  input(s): CHOL, HDL, LDLCALC, TRIG, CHOLHDL, LDLDIRECT in the last 72 hours. Thyroid Function Tests: No results for input(s): TSH, T4TOTAL, FREET4, T3FREE, THYROIDAB in the last 72 hours. Anemia Panel: No results for input(s): VITAMINB12, FOLATE, FERRITIN, TIBC, IRON, RETICCTPCT in the last 72 hours. Sepsis Labs: Recent Labs  Lab 06/05/18 1850  LATICACIDVEN 1.3    Recent Results (from the past 240 hour(s))  SARS Coronavirus 2 (CEPHEID - Performed in [facility] hospital lab), Hosp Order     Status: None   Collection Time: 06/05/18  6:23 PM  Result Value Ref Range Status   SARS Coronavirus 2 NEGATIVE NEGATIVE Final    Comment: (NOTE) If result is NEGATIVE SARS-CoV-2 target nucleic acids are NOT DETECTED. The SARS-CoV-2 RNA is generally detectable in upper and lower  respiratory specimens during the acute phase of infection. The lowest  concentration of SARS-CoV-2 viral copies this assay can detect is 250  copies / mL. A negative result does not preclude SARS-CoV-2 infection  and should not be used as the sole basis for treatment or  other  patient management decisions.  A negative result may occur with  improper specimen collection / handling, submission of specimen other  than nasopharyngeal swab, presence of viral mutation(s) within the  areas targeted by this assay, and inadequate number of viral copies  (<250 copies / mL). A negative result must be combined with clinical  observations, patient history, and epidemiological information. If result is POSITIVE SARS-CoV-2 target nucleic acids are DETECTED. The SARS-CoV-2 RNA is generally detectable in upper and lower  respiratory specimens dur ing the acute phase of infection.  Positive  results are indicative of active infection with SARS-CoV-2.  Clinical  correlation with patient history and other diagnostic information is  necessary to determine patient infection status.  Positive results do  not rule out bacterial infection or  co-infection with other viruses. If result is PRESUMPTIVE POSTIVE SARS-CoV-2 nucleic acids MAY BE PRESENT.   A presumptive positive result was obtained on the submitted specimen  and confirmed on repeat testing.  While 2019 novel coronavirus  (SARS-CoV-2) nucleic acids may be present in the submitted sample  additional confirmatory testing may be necessary for epidemiological  and / or clinical management purposes  to differentiate between  SARS-CoV-2 and other Sarbecovirus currently known to infect humans.  If clinically indicated additional testing with an alternate test  methodology [phone]) is advised. The SARS-CoV-2 RNA is generally  detectable in upper and lower respiratory sp ecimens during the acute  phase of infection. The expected result is Negative. Fact Sheet for Patients:  StrictlyIdeas.no Fact Sheet for Healthcare Providers: BankingDealers.co.za This test is not yet approved or cleared by the [place] FDA and has been authorized for detection and/or diagnosis of SARS-CoV-2 by FDA under an Emergency Use Authorization (EUA).  This EUA will remain in effect (meaning this test can be used) for the duration of the COVID-19 declaration under Section 564(b)(1) of the Act, 21 U.S.C. section 360bbb-3(b)(1), unless the authorization is terminated or revoked sooner. Performed at [facility], [address], [place], [place] [address]          Radiology Studies: Ct Abdomen Pelvis Wo Contrast  Result Date: 06/05/2018 CLINICAL DATA:  Diarrhea and abdominal pain EXAM: CT ABDOMEN AND PELVIS WITHOUT CONTRAST TECHNIQUE: Multidetector CT imaging of the abdomen and pelvis was performed following the standard protocol without IV contrast. COMPARISON:  11/06/2009 FINDINGS: Lower chest: Lungs are well aerated bilaterally. There is a 11 mm nodule identified in the right lower lobe. Additionally a 10 mm subpleural nodule is noted in the  left lower lobe. Hepatobiliary: Cholelithiasis is noted without complicating factors. The liver is within normal limits. Pancreas: Unremarkable. No pancreatic ductal dilatation or surrounding inflammatory changes. Spleen: Normal in size without focal abnormality. Adrenals/Urinary Tract: Adrenal glands are within normal limits. Kidneys are well visualized bilaterally. Few scattered hypodensities are noted likely representing cysts but incompletely evaluated on this exam. No renal calculi or obstructive changes are seen. The bladder is decompressed secondary to a Foley catheter. Wall thickening is seen consistent with the decompression. Stomach/Bowel: Changes consistent with rectal impaction are noted. Some very mild Peri rectal inflammatory changes are seen consistent with stercoral colitis. The more proximal colon shows some retained fecal material although no other changes of constipation are seen. The appendix is within normal limits. Small bowel and stomach are unremarkable. Vascular/Lymphatic: Aortic atherosclerosis. No enlarged abdominal or pelvic lymph nodes. Reproductive: Prostate is unremarkable. Other: No abdominal wall hernia or abnormality. No abdominopelvic ascites. Musculoskeletal: Degenerative changes of lumbar spine are  noted. IMPRESSION: Bilateral lower lobe nodules measuring up to 11 mm. The possibility of metastatic disease deserves consideration given a history of prior adenocarcinoma. Non-contrast chest CT at 3-6 months is recommended. If the nodules are stable at time of repeat CT, then future CT at 18-24 months (from today's scan) is considered optional for low-risk patients, but is recommended for high-risk patients. This recommendation follows the consensus statement: Guidelines for Management of Incidental Pulmonary Nodules Detected on CT Images: From the Fleischner Society 2017; Radiology 2017; 284:228-243. 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 Cholelithiasis without  complicating factors. Electronically Signed   By: Inez Catalina M.D.   On: 06/05/2018 20:45   Dg Chest 1 View  Result Date: 06/05/2018 CLINICAL DATA:  Diarrhea. EXAM: CHEST  1 VIEW COMPARISON:  06/04/2018 FINDINGS: Low lung volumes. The cardio pericardial silhouette is enlarged. Similar prominence of the right paratracheal stripe. Interstitial markings are diffusely coarsened with chronic features. Prominent skin fold noted over the left hemithorax. No focal consolidation or pleural effusion. The visualized bony structures of the thorax are intact. Telemetry leads overlie the chest. IMPRESSION: Low volume film without acute cardiopulmonary findings. Stable appearance of prominent right paratracheal opacity, potentially related to ectatic arch vessel anatomy and/or thyroid enlargement. Lymphadenopathy could also have this appearance. Electronically Signed   By: Misty Stanley M.D.   On: 06/05/2018 20:40   Ct Head Wo Contrast  Result Date: 06/05/2018 CLINICAL DATA:  Incontinence EXAM: CT HEAD WITHOUT CONTRAST TECHNIQUE: Contiguous axial images were obtained from the base of the skull through the vertex without intravenous contrast. COMPARISON:  None. FINDINGS: Brain: Chronic white matter ischemic changes are noted bilaterally. No findings to suggest acute hemorrhage, acute infarction or space-occupying mass lesion are seen. Vascular: No hyperdense vessel or unexpected calcification. Skull: Normal. Negative for fracture or focal lesion. Sinuses/Orbits: No acute finding. Other: None. IMPRESSION: Chronic white matter ischemic changes. No acute abnormality is noted. Electronically Signed   By: Inez Catalina M.D.   On: 06/05/2018 20:39        Scheduled Meds: . levothyroxine  150 mcg Oral QAC breakfast  . metoprolol succinate  25 mg Oral Daily  . tamsulosin  0.4 mg Oral Daily   Continuous Infusions: . sodium chloride 0.45 % with kcl 75 mL/hr at 06/06/18 0950     LOS: 0 days    Time spent: 30 minutes     Mykal Kirchman Darleen Crocker, DO [facility] Pager [phone]  If 7PM-7AM, please contact night-coverage [email or web address] Password [email or web address] 06/06/2018, 2:17 PM

## 2018-06-06 NOTE — TOC Initial Note (Signed)
Transition of Care [facility]) - Initial/Assessment Note    Patient Details  Name: Paul Bradley MRN: [id number] Date of Birth: [birth year]  [facility] CM/SW Contact:    Sherald Barge, RN Phone Number: 06/06/2018, 1:05 PM  Clinical Narrative:             From home, lives alone, [name] makes 4 -5 vists per day. He has two sisters sharing POA. Active with [facility] pta. Pt was recently in [employer]. [name] [facility]) would like for pt to return there we discuss pt being in co-pay days and sisters are will to find the funds if that is what is required. We discuss long term care planning and [name] plans to contact [employer] this week.  CM will fax pt out to [facility] and follow up with phone call tomorrow.      Expected Discharge Plan: [place]     Patient Goals and CMS Choice Patient states their goals for this hospitalization and ongoing recovery are:: Safety CMS Medicare.gov Compare Post Acute Care list provided to:: Patient Represenative (must comment)(Emily Boing) Choice offered to / list presented to : [place] / Guardian  Expected Discharge Plan and Services Expected Discharge Plan: [place] Acute Care Choice: [place] Living arrangements for the past 2 months: Single Family Home                       Prior Living Arrangements/Services Living arrangements for the past 2 months: Single Family Home Lives with:: Self Patient language and need for interpreter reviewed:: Yes Do you feel safe going back to the place where you live?: Yes(after rehab)      Need for Family Participation in Patient Care: Yes (Comment) Care giver support system in place?: Yes (comment) Current home services: Home RN Criminal Activity/Legal Involvement Pertinent to Current Situation/Hospitalization: No - Comment as needed  Activities of Daily Living Home Assistive Devices/Equipment: Gilford Rile (specify type) ADL Screening  (condition at time of admission) Patient's cognitive ability adequate to safely complete daily activities?: No Is the patient deaf or have difficulty hearing?: No Does the patient have difficulty seeing, even when wearing glasses/contacts?: No Does the patient have difficulty concentrating, remembering, or making decisions?: No Patient able to express need for assistance with ADLs?: Yes Does the patient have difficulty dressing or bathing?: Yes Independently performs ADLs?: No Communication: Independent Dressing (OT): Needs assistance Is this a change from baseline?: Change from baseline, expected to last >3 days Grooming: Needs assistance Is this a change from baseline?: Change from baseline, expected to last >3 days Feeding: Needs assistance Is this a change from baseline?: Change from baseline, expected to last >3 days Bathing: Dependent Is this a change from baseline?: Change from baseline, expected to last >3 days Toileting: Dependent Is this a change from baseline?: Change from baseline, expected to last >3days In/Out Bed: Needs assistance Is this a change from baseline?: Change from baseline, expected to last >3 days Walks in Home: Independent with device (comment) Does the patient have difficulty walking or climbing stairs?: Yes Weakness of Legs: Both Weakness of Arms/Hands: Both  Permission Sought/Granted Permission sought to share information with : Facility Art therapist granted to share information with : Yes, Verbal Permission Granted     Permission granted to share info w AGENCY: [facility]        Emotional Assessment  Orientation: : Oriented to Self, Oriented to Place      Admission diagnosis:  Orthostatic hypotension [I95.1] Hematochezia [K92.1] Hypokalemia [E87.6] Chronic anticoagulation [Z79.01] Generalized weakness [R53.1] Anemia, unspecified type [D64.9] Diarrhea, unspecified type [R19.7] Patient Active Problem List    Diagnosis Date Noted  . Generalized weakness 06/05/2018   PCP:  [name], MD Pharmacy:   [facility], [address] AT [address] [place] [address] Phone: [phone] Fax: [phone]      Readmission Risk Interventions No flowsheet data found.

## 2018-06-06 NOTE — Progress Notes (Signed)
Initial Nutrition Assessment  RD working remotely.  DOCUMENTATION CODES:   Not applicable  INTERVENTION:  Once diet advanced, recommend Ensure Enlive po BID, each supplement provides 350 kcal and 20 grams of protein.  Also recommend daily MVI.  NUTRITION DIAGNOSIS:   Increased nutrient needs related to wound healing as evidenced by estimated needs.  GOAL:   Patient will meet greater than or equal to 90% of their needs  MONITOR:   Diet advancement, PO intake, Supplement acceptance, Labs, Weight trends, Skin, I & O's  REASON FOR ASSESSMENT:   Malnutrition Screening Tool    ASSESSMENT:   73 year old male with PMHx of papillary thyroid cancer s/p thyroidectomy (followed at [facility]), A-fib, HTN, DM, CAD, BPH admitted with possible GI bleed, hyponatremia, hypokalemia, generalized weakness.   Attempted to call patient's phone for nutrition/weight history but he was unable to answer phone. Per chart he is a poor historian. He was on CLD yesterday but has been NPO since midnight. Pending GI assessment. PT recommending patient discharge to SNF. If weights in chart are accurate patient's weight has been trending down. He was 83.5 kg on 02/06/2018 and is currently 74.5 kg (164.24 lbs). He has lost 9 kg (10.8% body weight) over the past 4 months, which is significant for time frame.  Medications reviewed and include: levothyroxine, Flomax, 1/2NS with KCl 40 mEq at 75 mL/hr.  Labs reviewed: Potassium 3.1, BUN 25.  Patient is at risk for malnutrition.  NUTRITION - FOCUSED PHYSICAL EXAM:  Unable to complete at this time.  Diet Order:   Diet Order            Diet NPO time specified  Diet effective midnight             EDUCATION NEEDS:   No education needs have been identified at this time  Skin:  Skin Assessment: Skin Integrity Issues:(diabetic ulcer left foot (2cm x 2cm); open wound left hip (1.5cm x 1.5cm))  Last BM:  06/06/2018  Height:   Ht Readings from Last 1  Encounters:  06/05/18 6' (1.829 m)   Weight:   Wt Readings from Last 1 Encounters:  06/05/18 74.5 kg   Ideal Body Weight:  80.9 kg  BMI:  Body mass index is 22.28 kg/m.  Estimated Nutritional Needs:   Kcal:  1850-2150  Protein:  90-100 grams  Fluid:  1.8 L/day  Willey Blade, MS, RD, LDN Office: [phone] Pager: [phone] After Hours/Weekend Pager: [phone]

## 2018-06-06 NOTE — Plan of Care (Signed)
  Problem: Acute Rehab PT Goals(only PT should resolve) Goal: Pt will Roll Supine to Side Outcome: Progressing Flowsheets (Taken 06/06/2018 0909) Pt will Roll Supine to Side: with modified independence Goal: Pt Will Go Supine/Side To Sit Outcome: Progressing Flowsheets (Taken 06/06/2018 0909) Pt will go Supine/Side to Sit: with modified independence Goal: Patient Will Transfer Sit To/From Stand Outcome: Progressing Flowsheets (Taken 06/06/2018 0909) Patient will transfer sit to/from stand: with modified independence Goal: Pt Will Transfer Bed To Chair/Chair To Bed Outcome: Progressing Flowsheets (Taken 06/06/2018 0909) Pt will Transfer Bed to Chair/Chair to Bed: with modified independence Goal: Pt Will Perform Standing Balance Or Pre-Gait Outcome: Progressing Flowsheets (Taken 06/06/2018 0909) Pt will perform standing balance or pre-gait : with Modified Independent Goal: Pt Will Ambulate Outcome: Progressing Flowsheets (Taken 06/06/2018 0909) Pt will Ambulate: 100 feet; with least restrictive assistive device; with modified independence    Clarene Critchley PT, DPT 9:10 AM, 06/06/18 [phone]

## 2018-06-07 ENCOUNTER — Ambulatory Visit (HOSPITAL_COMMUNITY): Admission: RE | Admit: 2018-06-07 | Payer: Medicare HMO | Source: Ambulatory Visit

## 2018-06-07 ENCOUNTER — Ambulatory Visit (HOSPITAL_COMMUNITY): Payer: Medicare HMO

## 2018-06-07 DIAGNOSIS — R197 Diarrhea, unspecified: Secondary | ICD-10-CM | POA: Diagnosis not present

## 2018-06-07 LAB — GASTROINTESTINAL PANEL BY PCR, STOOL (REPLACES STOOL CULTURE)

## 2018-06-07 LAB — CBC
HCT: 33.1 % — ABNORMAL LOW (ref 39.0–52.0)
Hemoglobin: 11 g/dL — ABNORMAL LOW (ref 13.0–17.0)
MCH: 31.3 pg (ref 26.0–34.0)
MCHC: 33.2 g/dL (ref 30.0–36.0)
MCV: 94.3 fL (ref 80.0–100.0)
Platelets: 216 10*3/uL (ref 150–400)
RBC: 3.51 MIL/uL — ABNORMAL LOW (ref 4.22–5.81)
RDW: 15.2 % (ref 11.5–15.5)
WBC: 7.7 10*3/uL (ref 4.0–10.5)
nRBC: 0 % (ref 0.0–0.2)

## 2018-06-07 LAB — MAGNESIUM: Magnesium: 1.7 mg/dL (ref 1.7–2.4)

## 2018-06-07 LAB — BASIC METABOLIC PANEL
Anion gap: 8 (ref 5–15)
BUN: 18 mg/dL (ref 8–23)
CO2: 26 mmol/L (ref 22–32)
Calcium: 8.7 mg/dL — ABNORMAL LOW (ref 8.9–10.3)
Chloride: 103 mmol/L (ref 98–111)
Creatinine, Ser: 0.91 mg/dL (ref 0.61–1.24)
GFR calc Af Amer: >60 mL/min (ref >60)
GFR calc non Af Amer: >60 mL/min (ref >60)
Glucose, Bld: 99 mg/dL (ref 70–99)
Potassium: 3.4 mmol/L — ABNORMAL LOW (ref 3.5–5.1)
Sodium: 137 mmol/L (ref 135–145)

## 2018-06-07 MED ORDER — SODIUM CHLORIDE 0.45 % IV SOLN
INTRAVENOUS | Status: AC
  Administered 2018-06-07: 20:00:00 via INTRAVENOUS
  Filled 2018-06-07 (×2): qty 1000

## 2018-06-07 MED ORDER — POTASSIUM CHLORIDE CRYS ER 20 MEQ PO TBCR
40.0000 meq | EXTENDED_RELEASE_TABLET | Freq: Once | ORAL | Status: AC
  Administered 2018-06-07: 40 meq via ORAL
  Filled 2018-06-07: qty 2

## 2018-06-07 NOTE — Discharge Summary (Addendum)
Physician Discharge Summary  Paul Bradley NGE:[id number] DOB: Aug 30, 1945 DOA: 06/05/2018  PCP: [name], MD  Admit date: 06/05/2018  Discharge date: 06/09/2018  Admitted From:Home  Disposition:  SNF  Recommendations for Outpatient Follow-up:  1)You are taking apixaban/Eliquis for blood thinner so please Avoid ibuprofen/Advil/Aleve/Motrin/Goody Powders/Naproxen/BC powders/Meloxicam/Diclofenac/Indomethacin and other Nonsteroidal anti-inflammatory medications as these will make you more likely to bleed and can cause stomach ulcers, can also cause Kidney problems.  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 Patient is an extremely poor historian but he tells me that he was seen at [facility] 2 days ago emergency room yesterday and today, complains of persistent weakness. He is barely able to get out of bed and walk. He also has noticed several loose bloody bowel movements, however none today. He was admitted in April 2020 at [facility] for hematochezia and hypocalcemia, and underwent a colonoscopy on 4/23 with evidence of diverticulosis but no active bleeding or masses. There was concern for diffuse dilatation of the cecum on imaging. Patient denies any fever or chills, denies any chest pain, denies any vomiting but does complain of a very poor appetite.  Patient was admitted with concern for  diverticular GI bleed in the setting of Eliquis use.  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  Patient does have periods of increased confusion while he is on his chemotherapy agents for thyroid cancer.  He is also on a chronic Foley catheter for urinary retention issues and is due to have a suprapubic catheter in the near future.  He is currently stable on the morning of 5/26 and okay for discharge to SNF as recommended by physical therapy.  No further bloody bowel movements noted and GI has signed off.  Hemoglobin stable around 11, Eliquis has been restarted  Discharge Diagnoses:  Principal Problem:   GI bleed Active Problems:   Chronic a-fib on Eliquis   Anticoagulant long-term use   Generalized weakness   Diarrhea   Diverticulosis   Acute blood loss anemia---  Gi Bleed  Principal discharge diagnosis: GI bleed spontaneously resolved.  Discharge Instructions  Discharge Instructions    Call MD for:  difficulty breathing, headache or visual disturbances   Complete by:  As directed    Call MD for:  persistant dizziness or light-headedness   Complete by:  As directed    Call MD for:  severe uncontrolled pain   Complete by:  As directed    Call MD for:  temperature >100.4   Complete by:  As directed    Diet - low sodium heart healthy   Complete by:  As directed    Diet - low sodium heart healthy   Complete by:  As directed    Discharge instructions   Complete by:  As directed    1)You are taking apixaban/Eliquis for blood thinner so please Avoid ibuprofen/Advil/Aleve/Motrin/Goody Powders/Naproxen/BC powders/Meloxicam/Diclofenac/Indomethacin and other Nonsteroidal  anti-inflammatory medications as these will make you more likely to bleed and can cause stomach ulcers, can also cause Kidney problems.   2)Repeat CBC (complete blood count on Monday, 06/13/2018)  3) call or return if any concerns about  bleeding or dark stools   Increase activity slowly   Complete by:  As directed    Increase activity slowly   Complete by:  As directed      Allergies as of 06/09/2018      Reactions   Penicillins Rash   Did it involve swelling of the face/tongue/throat, SOB, or low BP? Unknown Did it involve sudden or severe rash/hives, skin peeling, or any reaction on the inside of your mouth or nose? Unknown Did you need to seek medical attention at a hospital or doctor's office? Unknown When did it last happen? If all above answers are "NO", may proceed with cephalosporin use.      Medication List    STOP taking these medications   doxycycline 100 MG capsule Commonly known as:  VIBRAMYCIN   hydrochlorothiazide 25 MG tablet Commonly known as:  HYDRODIURIL   LENVIMA (10 MG DAILY DOSE) PO     TAKE these medications   bacitracin 500 UNIT/GM ointment Apply 1 application topically 2 (two) times daily.   calcitRIOL 0.5 MCG capsule Commonly known as:  ROCALTROL Take 0.5 mcg by mouth 2 (two) times daily.   calcium carbonate 500 MG chewable tablet Commonly known as:  TUMS - dosed in mg elemental calcium Chew 1 tablet by mouth 5 (five) times daily as needed for indigestion or heartburn.   cholecalciferol 25 MCG (1000 UT) tablet Commonly known as:  VITAMIN D3 Take 1,000 Units by mouth daily.   Eliquis 5 MG Tabs tablet Generic drug:  apixaban Take 5 mg by mouth 2 (two) times daily.   levothyroxine 150 MCG tablet Commonly known as:  SYNTHROID Take 150 mcg by mouth daily before breakfast.   lisinopril 5 MG tablet Commonly known as:  ZESTRIL Take 10 mg by mouth daily.   metoprolol succinate 25 MG 24 hr tablet Commonly known as:  TOPROL-XL Take 25 mg by mouth daily.   nystatin powder Generic drug:  nystatin Apply 1 g topically 4 (four) times daily.   ondansetron 8 MG disintegrating tablet Commonly known as:  ZOFRAN-ODT   Senna-Plus 8.6-50 MG tablet Generic drug:   senna-docusate Take 1 tablet by mouth daily.   tamsulosin 0.4 MG Caps capsule Commonly known as:  FLOMAX Take 1 capsule (0.4 mg total) by mouth daily after supper. What changed:  when to take this   vitamin C 1000 MG tablet Take 1,000 mg by mouth daily.       Contact information for follow-up providers    [name], [name], MD Follow up in 1 week(s).   Specialty:  Internal Medicine Contact information: [place] [address] [phone]            Contact information for after-discharge care    [facility] .   Service:  Skilled Nursing Contact information: [place] [phone]                 Allergies  Allergen Reactions  . Penicillins Rash    Did it involve swelling of the face/tongue/throat, SOB, or low BP? Unknown Did it involve sudden or severe rash/hives, skin peeling, or any reaction on the inside of your mouth or  nose? Unknown Did you need to seek medical attention at a hospital or doctor's office? Unknown When did it last happen? If all above answers are "NO", may proceed with cephalosporin use.     Consultations:  GI   Procedures/Studies: Ct Abdomen Pelvis Wo Contrast  Result Date: 06/05/2018 CLINICAL DATA:  Diarrhea and abdominal pain EXAM: CT ABDOMEN AND PELVIS WITHOUT CONTRAST TECHNIQUE: Multidetector CT imaging of the abdomen and pelvis was performed following the standard protocol without IV contrast. COMPARISON:  11/06/2009 FINDINGS: Lower chest: Lungs are well aerated bilaterally. There is a 11 mm nodule identified in the right lower lobe. Additionally a 10 mm subpleural nodule is noted in the left lower lobe. Hepatobiliary: Cholelithiasis is noted without complicating factors. The liver is within normal limits. Pancreas: Unremarkable. No pancreatic ductal dilatation or surrounding inflammatory changes. Spleen: Normal in  size without focal abnormality. Adrenals/Urinary Tract: Adrenal glands are within normal limits. Kidneys are well visualized bilaterally. Few scattered hypodensities are noted likely representing cysts but incompletely evaluated on this exam. No renal calculi or obstructive changes are seen. The bladder is decompressed secondary to a Foley catheter. Wall thickening is seen consistent with the decompression. Stomach/Bowel: Changes consistent with rectal impaction are noted. Some very mild Peri rectal inflammatory changes are seen consistent with stercoral colitis. The more proximal colon shows some retained fecal material although no other changes of constipation are seen. The appendix is within normal limits. Small bowel and stomach are unremarkable. Vascular/Lymphatic: Aortic atherosclerosis. No enlarged abdominal or pelvic lymph nodes. Reproductive: Prostate is unremarkable. Other: No abdominal wall hernia or abnormality. No abdominopelvic ascites. Musculoskeletal: Degenerative changes of lumbar spine are noted. IMPRESSION: Bilateral lower lobe nodules measuring up to 11 mm. The possibility of metastatic disease deserves consideration given a history of prior adenocarcinoma. Non-contrast chest CT at 3-6 months is recommended. If the nodules are stable at time of repeat CT, then future CT at 18-24 months (from today's scan) is considered optional for low-risk patients, but is recommended for high-risk patients. This recommendation follows the consensus statement: Guidelines for Management of Incidental Pulmonary Nodules Detected on CT Images: From the Fleischner Society 2017; Radiology 2017; 284:228-243. 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 Cholelithiasis without complicating factors. Electronically Signed   By: Inez Catalina M.D.   On: 06/05/2018 20:45   Dg Chest 1 View  Result Date: 06/05/2018 CLINICAL DATA:  Diarrhea. EXAM: CHEST  1 VIEW COMPARISON:  06/04/2018 FINDINGS: Low lung volumes. The  cardio pericardial silhouette is enlarged. Similar prominence of the right paratracheal stripe. Interstitial markings are diffusely coarsened with chronic features. Prominent skin fold noted over the left hemithorax. No focal consolidation or pleural effusion. The visualized bony structures of the thorax are intact. Telemetry leads overlie the chest. IMPRESSION: Low volume film without acute cardiopulmonary findings. Stable appearance of prominent right paratracheal opacity, potentially related to ectatic arch vessel anatomy and/or thyroid enlargement. Lymphadenopathy could also have this appearance. Electronically Signed   By: Misty Stanley M.D.   On: 06/05/2018 20:40   Ct Head Wo Contrast  Result Date: 06/05/2018 CLINICAL DATA:  Incontinence EXAM: CT HEAD WITHOUT CONTRAST TECHNIQUE: Contiguous axial images were obtained from the base of the skull through the vertex without intravenous contrast. COMPARISON:  None. FINDINGS: Brain: Chronic white matter ischemic changes are noted bilaterally. No findings to suggest acute hemorrhage, acute infarction or space-occupying mass lesion are seen. Vascular: No hyperdense vessel or unexpected calcification. Skull: Normal. Negative for fracture or focal lesion. Sinuses/Orbits: No acute finding.  Other: None. IMPRESSION: Chronic white matter ischemic changes. No acute abnormality is noted. Electronically Signed   By: Inez Catalina M.D.   On: 06/05/2018 20:39   Dg Chest Port 1 View  Result Date: 06/04/2018 CLINICAL DATA:  Weakness.  Thyroid cancer. EXAM: PORTABLE CHEST 1 VIEW COMPARISON:  02/06/2018 FINDINGS: Heart size upper normal. Negative for heart failure. No infiltrate or effusion Surgical clips in the region of the thyroid. Prominent soft tissue on both sides of the trachea could represent substernal goiter or adenopathy. Note is made of goiter on the CT of 11/06/2009 however there has been interval thyroidectomy. IMPRESSION: Prominent superior mediastinal soft  tissue compatible with goiter or adenopathy. Recommend CT neck and chest with contrast. Electronically Signed   By: Franchot Gallo M.D.   On: 06/04/2018 09:01    Discharge Exam: Vitals:   06/08/18 2055 06/09/18 0524  BP: 133/89 129/88  Pulse: (!) 106 83  Resp: 17 16  Temp: 97.7 F (36.5 C) 98.1 F (36.7 C)  SpO2: 99% 99%   Vitals:   06/08/18 1346 06/08/18 1919 06/08/18 2055 06/09/18 0524  BP: 122/88  133/89 129/88  Pulse: (!) 101  (!) 106 83  Resp: 17  17 16   Temp: 98.2 F (36.8 C)  97.7 F (36.5 C) 98.1 F (36.7 C)  TempSrc:   Oral   SpO2: 99% 96% 99% 99%  Weight:      Height:        General: Pt is alert, awake, not in acute distress Cardiovascular: RRR, S1/S2 +, no rubs, no gallops Respiratory: CTA bilaterally, no wheezing, no rhonchi Abdominal: Soft, NT, ND, bowel sounds + Extremities: no edema, no cyanosis Psych----affect is appropriate, alert and oriented x3 Neuro---generalized weakness without new focal deficits   The results of significant diagnostics from this hospitalization (including imaging, microbiology, ancillary and laboratory) are listed below for reference.     Microbiology: Recent Results (from the past 240 hour(s))  Urine culture     Status: None   Collection Time: 06/05/18  6:20 PM  Result Value Ref Range Status   Specimen Description   Final    URINE, CATHETERIZED Performed at [facility], [address] [place], [place] [address]    Special Requests   Final    NONE Performed at [facility], [address] [place], [place] [address]    Culture   Final    NO GROWTH Performed at [facility], [place] [address], [place], [place] [address]    Report Status 06/06/2018 FINAL  Final  SARS Coronavirus 2 (CEPHEID - Performed in [facility] hospital lab), Hosp Order     Status: None   Collection Time: 06/05/18  6:23 PM  Result Value Ref Range Status   SARS Coronavirus 2 NEGATIVE NEGATIVE Final    Comment: (NOTE) If result is  NEGATIVE SARS-CoV-2 target nucleic acids are NOT DETECTED. The SARS-CoV-2 RNA is generally detectable in upper and lower  respiratory specimens during the acute phase of infection. The lowest  concentration of SARS-CoV-2 viral copies this assay can detect is 250  copies / mL. A negative result does not preclude SARS-CoV-2 infection  and should not be used as the sole basis for treatment or other  patient management decisions.  A negative result may occur with  improper specimen collection / handling, submission of specimen other  than nasopharyngeal swab, presence of viral mutation(s) within the  areas targeted by this assay, and inadequate number of viral copies  (<250  copies / mL). A negative result must be combined with clinical  observations, patient history, and epidemiological information. If result is POSITIVE SARS-CoV-2 target nucleic acids are DETECTED. The SARS-CoV-2 RNA is generally detectable in upper and lower  respiratory specimens dur ing the acute phase of infection.  Positive  results are indicative of active infection with SARS-CoV-2.  Clinical  correlation with patient history and other diagnostic information is  necessary to determine patient infection status.  Positive results do  not rule out bacterial infection or co-infection with other viruses. If result is PRESUMPTIVE POSTIVE SARS-CoV-2 nucleic acids MAY BE PRESENT.   A presumptive positive result was obtained on the submitted specimen  and confirmed on repeat testing.  While 2019 novel coronavirus  (SARS-CoV-2) nucleic acids may be present in the submitted sample  additional confirmatory testing may be necessary for epidemiological  and / or clinical management purposes  to differentiate between  SARS-CoV-2 and other Sarbecovirus currently known to infect humans.  If clinically indicated additional testing with an alternate test  methodology [phone]) is advised. The SARS-CoV-2 RNA is generally  detectable  in upper and lower respiratory sp ecimens during the acute  phase of infection. The expected result is Negative. Fact Sheet for Patients:  StrictlyIdeas.no Fact Sheet for Healthcare Providers: BankingDealers.co.za This test is not yet approved or cleared by the [place] FDA and has been authorized for detection and/or diagnosis of SARS-CoV-2 by FDA under an Emergency Use Authorization (EUA).  This EUA will remain in effect (meaning this test can be used) for the duration of the COVID-19 declaration under Section 564(b)(1) of the Act, 21 U.S.C. section 360bbb-3(b)(1), unless the authorization is terminated or revoked sooner. Performed at [facility], [address], [place], [place] [address]   Gastrointestinal Panel by PCR , Stool     Status: None   Collection Time: 06/06/18  3:20 AM  Result Value Ref Range Status   Campylobacter species NOT DETECTED NOT DETECTED Final   Plesimonas shigelloides NOT DETECTED NOT DETECTED Final   Salmonella species NOT DETECTED NOT DETECTED Final   Yersinia enterocolitica NOT DETECTED NOT DETECTED Final   Vibrio species NOT DETECTED NOT DETECTED Final   Vibrio cholerae NOT DETECTED NOT DETECTED Final   Enteroaggregative E coli (EAEC) NOT DETECTED NOT DETECTED Final   Enteropathogenic E coli (EPEC) NOT DETECTED NOT DETECTED Final   Enterotoxigenic E coli (ETEC) NOT DETECTED NOT DETECTED Final   Shiga like toxin producing E coli (STEC) NOT DETECTED NOT DETECTED Final   Shigella/Enteroinvasive E coli (EIEC) NOT DETECTED NOT DETECTED Final   Cryptosporidium NOT DETECTED NOT DETECTED Final   Cyclospora cayetanensis NOT DETECTED NOT DETECTED Final   Entamoeba histolytica NOT DETECTED NOT DETECTED Final   Giardia lamblia NOT DETECTED NOT DETECTED Final   Adenovirus F40/41 NOT DETECTED NOT DETECTED Final   Astrovirus NOT DETECTED NOT DETECTED Final   Norovirus GI/GII NOT DETECTED NOT DETECTED Final    Rotavirus A NOT DETECTED NOT DETECTED Final   Sapovirus (I, II, IV, and V) NOT DETECTED NOT DETECTED Final    Comment: Performed at [facility], [address] [place], [place] [address]     Labs: BNP (last 3 results) No results for input(s): BNP in the last 8760 hours. Basic Metabolic Panel: Recent Labs  Lab 06/04/18 0900 06/05/18 1850 06/06/18 0627 06/07/18 0759 06/09/18 0459  NA 139 130* 137 137 138  K 3.5 3.0* 3.1* 3.4* 3.2*  CL 98 91* 99 103 100  CO2 30 29 27 26 27   GLUCOSE 105* 113* 91 99 100*  BUN 28* 30* 25* 18 13  CREATININE 1.18 1.26* 1.03 0.91 0.99  CALCIUM 12.1* 10.7* 10.1 8.7* 8.0*  MG 2.0 1.8  --  1.7  --    Liver Function Tests: Recent Labs  Lab 06/04/18 0900 06/05/18 1850 06/06/18 0627  AST 40 37 37  ALT 19 18 18   ALKPHOS 43 40 39  BILITOT 0.8 1.0 0.8  PROT 7.6 6.5 6.2*  ALBUMIN 3.3* 3.0* 2.7*   Recent Labs  Lab 06/05/18 1850  LIPASE 30   No results for input(s): AMMONIA in the last 168 hours. CBC: Recent Labs  Lab 06/04/18 0900 06/05/18 1850 06/06/18 0627 06/07/18 0759 06/09/18 0459  WBC 6.7 9.6 8.2 7.7 6.6  NEUTROABS 4.9 7.2  --   --   --   HGB 13.6 11.8* 11.9* 11.0* 11.0*  HCT 41.2 35.0* 37.2* 33.1* 33.7*  MCV 93.6 93.6 95.6 94.3 95.2  PLT 263 240 231 216 240   Cardiac Enzymes: Recent Labs  Lab 06/05/18 1850  TROPONINI <0.03   BNP: Invalid input(s): POCBNP CBG: No results for input(s): GLUCAP in the last 168 hours. D-Dimer No results for input(s): DDIMER in the last 72 hours. Hgb A1c No results for input(s): HGBA1C in the last 72 hours. Lipid Profile No results for input(s): CHOL, HDL, LDLCALC, TRIG, CHOLHDL, LDLDIRECT in the last 72 hours. Thyroid function studies Recent Labs    06/08/18 2350  TSH 5.376*   Anemia work up No results for input(s): VITAMINB12, FOLATE, FERRITIN, TIBC, IRON, RETICCTPCT in the last 72 hours. Urinalysis    Component Value Date/Time   COLORURINE YELLOW 06/05/2018 1820    APPEARANCEUR HAZY (A) 06/05/2018 1820   LABSPEC 1.013 06/05/2018 1820   PHURINE 5.0 06/05/2018 1820   GLUCOSEU NEGATIVE 06/05/2018 1820   HGBUR LARGE (A) 06/05/2018 1820   BILIRUBINUR NEGATIVE 06/05/2018 1820   KETONESUR NEGATIVE 06/05/2018 1820   PROTEINUR NEGATIVE 06/05/2018 1820   NITRITE NEGATIVE 06/05/2018 1820   LEUKOCYTESUR TRACE (A) 06/05/2018 1820   Sepsis Labs Invalid input(s): PROCALCITONIN,  WBC,  LACTICIDVEN Microbiology Recent Results (from the past 240 hour(s))  Urine culture     Status: None   Collection Time: 06/05/18  6:20 PM  Result Value Ref Range Status   Specimen Description   Final    URINE, CATHETERIZED Performed at [facility], [address] [place], [place] [address]    Special Requests   Final    NONE Performed at [facility], [address], [place], [place] [address]    Culture   Final    NO GROWTH Performed at [facility], [place] [address] [place], [place] [address]    Report Status 06/06/2018 FINAL  Final  SARS Coronavirus 2 (CEPHEID - Performed in [facility] hospital lab), Hosp Order     Status: None   Collection Time: 06/05/18  6:23 PM  Result Value Ref Range Status   SARS Coronavirus 2 NEGATIVE NEGATIVE Final    Comment: (NOTE) If result is NEGATIVE SARS-CoV-2 target nucleic acids are NOT DETECTED. The SARS-CoV-2 RNA is generally detectable in upper and lower  respiratory specimens during the acute phase of infection. The lowest  concentration of SARS-CoV-2 viral copies this assay can detect is 250  copies / mL. A negative result does not preclude SARS-CoV-2 infection  and should not be used as the sole basis for treatment or other  patient management decisions.  A negative result may occur with  improper specimen collection / handling, submission of specimen other  than nasopharyngeal swab, presence of viral mutation(s) within the  areas targeted by this assay, and inadequate number of viral copies  (<250 copies / mL). A  negative result must be combined with clinical  observations, patient history, and epidemiological information. If result is POSITIVE SARS-CoV-2 target nucleic acids are DETECTED. The SARS-CoV-2 RNA is generally detectable in upper and lower  respiratory specimens dur ing the acute phase of infection.  Positive  results are indicative of active infection with SARS-CoV-2.  Clinical  correlation with patient history and other diagnostic information is  necessary to determine patient infection status.  Positive results do  not rule out bacterial infection or co-infection with other viruses. If result is PRESUMPTIVE POSTIVE SARS-CoV-2 nucleic acids MAY BE PRESENT.   A presumptive positive result was obtained on the submitted specimen  and confirmed on repeat testing.  While 2019 novel coronavirus  (SARS-CoV-2) nucleic acids may be present in the submitted sample  additional confirmatory testing may be necessary for epidemiological  and / or clinical management purposes  to differentiate between  SARS-CoV-2 and other Sarbecovirus currently known to infect humans.  If clinically indicated additional testing with an alternate test  methodology [phone]) is advised. The SARS-CoV-2 RNA is generally  detectable in upper and lower respiratory sp ecimens during the acute  phase of infection. The expected result is Negative. Fact Sheet for Patients:  StrictlyIdeas.no Fact Sheet for Healthcare Providers: BankingDealers.co.za This test is not yet approved or cleared by the [place] FDA and has been authorized for detection and/or diagnosis of SARS-CoV-2 by FDA under an Emergency Use Authorization (EUA).  This EUA will remain in effect (meaning this test can be used) for the duration of the COVID-19 declaration under Section 564(b)(1) of the Act, 21 U.S.C. section 360bbb-3(b)(1), unless the authorization is terminated or revoked sooner. Performed  at [facility], [address] [place], [place] [address]   Gastrointestinal Panel by PCR , Stool     Status: None   Collection Time: 06/06/18  3:20 AM  Result Value Ref Range Status   Campylobacter species NOT DETECTED NOT DETECTED Final   Plesimonas shigelloides NOT DETECTED NOT DETECTED Final   Salmonella species NOT DETECTED NOT DETECTED Final   Yersinia enterocolitica NOT DETECTED NOT DETECTED Final   Vibrio species NOT DETECTED NOT DETECTED Final   Vibrio cholerae NOT DETECTED NOT DETECTED Final   Enteroaggregative E coli (EAEC) NOT DETECTED NOT DETECTED Final   Enteropathogenic E coli (EPEC) NOT DETECTED NOT DETECTED Final   Enterotoxigenic E coli (ETEC) NOT DETECTED NOT DETECTED Final   Shiga like toxin producing E coli (STEC) NOT DETECTED NOT DETECTED Final   Shigella/Enteroinvasive E coli (EIEC) NOT DETECTED NOT DETECTED Final   Cryptosporidium NOT DETECTED NOT DETECTED Final   Cyclospora cayetanensis NOT DETECTED NOT DETECTED Final   Entamoeba histolytica NOT DETECTED NOT DETECTED Final   Giardia lamblia NOT DETECTED NOT DETECTED Final   Adenovirus F40/41 NOT DETECTED NOT DETECTED Final   Astrovirus NOT DETECTED NOT DETECTED Final   Norovirus GI/GII NOT DETECTED NOT DETECTED Final   Rotavirus A NOT DETECTED NOT DETECTED Final   Sapovirus (I, II, IV, and V) NOT DETECTED NOT DETECTED Final    Comment: Performed at [facility], [address] [place], [place] [address]     Time coordinating discharge: 35 minutes  SIGNED:   Roxan Hockey, DO [facility]  06/09/2018, 7:53 AM  If 7PM-7AM, please contact night-coverage [email or web address] Password [email or web address]

## 2018-06-07 NOTE — Progress Notes (Signed)
Subjective: No abdominal pain. Tolerating diet. No N/V. States BM last night. Verified this with nursing staff. No rectal bleeding.   Objective: Vital signs in last 24 hours: Temp:  [97.5 F (36.4 C)-97.7 F (36.5 C)] 97.5 F (36.4 C) (05/26 0619) Pulse Rate:  [86-94] 89 (05/26 0619) Resp:  [16-20] 20 (05/26 0619) BP: (122-141)/(67-95) 141/95 (05/26 0619) SpO2:  [99 %-100 %] 100 % (05/26 0619) Last BM Date: 06/06/18 General:   Alert and oriented to person, pleasant.  Abdomen:  Bowel sounds present, soft, non-tender, non-distended. No HSM or hernias noted. No rebound or guarding. No masses appreciated  Extremities:  Without edema. Gauze to left foot Psych:  Normal mood and affect.  Intake/Output from previous day: 05/25 0701 - 05/26 0700 In: 616.7 [P.O.:240; I.V.:376.7] Out: 2950 [Urine:2950] Intake/Output this shift: No intake/output data recorded.  Lab Results: Recent Labs    06/04/18 0900 06/05/18 1850 06/06/18 0627  WBC 6.7 9.6 8.2  HGB 13.6 11.8* 11.9*  HCT 41.2 35.0* 37.2*  PLT 263 240 231   BMET Recent Labs    06/04/18 0900 06/05/18 1850 06/06/18 0627  NA 139 130* 137  K 3.5 3.0* 3.1*  CL 98 91* 99  CO2 30 29 27   GLUCOSE 105* 113* 91  BUN 28* 30* 25*  CREATININE 1.18 1.26* 1.03  CALCIUM 12.1* 10.7* 10.1   LFT Recent Labs    06/04/18 0900 06/05/18 1850 06/06/18 0627  PROT 7.6 6.5 6.2*  ALBUMIN 3.3* 3.0* 2.7*  AST 40 37 37  ALT 19 18 18   ALKPHOS 43 40 39  BILITOT 0.8 1.0 0.8   PT/INR Recent Labs    06/05/18 1850  LABPROT 15.3*  INR 1.2     Studies/Results: Ct Abdomen Pelvis Wo Contrast  Result Date: 06/05/2018 CLINICAL DATA:  Diarrhea and abdominal pain EXAM: CT ABDOMEN AND PELVIS WITHOUT CONTRAST TECHNIQUE: Multidetector CT imaging of the abdomen and pelvis was performed following the standard protocol without IV contrast. COMPARISON:  11/06/2009 FINDINGS: Lower chest: Lungs are well aerated bilaterally. There is a 11 mm nodule  identified in the right lower lobe. Additionally a 10 mm subpleural nodule is noted in the left lower lobe. Hepatobiliary: Cholelithiasis is noted without complicating factors. The liver is within normal limits. Pancreas: Unremarkable. No pancreatic ductal dilatation or surrounding inflammatory changes. Spleen: Normal in size without focal abnormality. Adrenals/Urinary Tract: Adrenal glands are within normal limits. Kidneys are well visualized bilaterally. Few scattered hypodensities are noted likely representing cysts but incompletely evaluated on this exam. No renal calculi or obstructive changes are seen. The bladder is decompressed secondary to a Foley catheter. Wall thickening is seen consistent with the decompression. Stomach/Bowel: Changes consistent with rectal impaction are noted. Some very mild Peri rectal inflammatory changes are seen consistent with stercoral colitis. The more proximal colon shows some retained fecal material although no other changes of constipation are seen. The appendix is within normal limits. Small bowel and stomach are unremarkable. Vascular/Lymphatic: Aortic atherosclerosis. No enlarged abdominal or pelvic lymph nodes. Reproductive: Prostate is unremarkable. Other: No abdominal wall hernia or abnormality. No abdominopelvic ascites. Musculoskeletal: Degenerative changes of lumbar spine are noted. IMPRESSION: Bilateral lower lobe nodules measuring up to 11 mm. The possibility of metastatic disease deserves consideration given a history of prior adenocarcinoma. Non-contrast chest CT at 3-6 months is recommended. If the nodules are stable at time of repeat CT, then future CT at 18-24 months (from today's scan) is considered optional for low-risk patients, but is recommended  for high-risk patients. This recommendation follows the consensus statement: Guidelines for Management of Incidental Pulmonary Nodules Detected on CT Images: From the Fleischner Society 2017; Radiology 2017;  284:228-243. 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 Cholelithiasis without complicating factors. Electronically Signed   By: Inez Catalina M.D.   On: 06/05/2018 20:45   Dg Chest 1 View  Result Date: 06/05/2018 CLINICAL DATA:  Diarrhea. EXAM: CHEST  1 VIEW COMPARISON:  06/04/2018 FINDINGS: Low lung volumes. The cardio pericardial silhouette is enlarged. Similar prominence of the right paratracheal stripe. Interstitial markings are diffusely coarsened with chronic features. Prominent skin fold noted over the left hemithorax. No focal consolidation or pleural effusion. The visualized bony structures of the thorax are intact. Telemetry leads overlie the chest. IMPRESSION: Low volume film without acute cardiopulmonary findings. Stable appearance of prominent right paratracheal opacity, potentially related to ectatic arch vessel anatomy and/or thyroid enlargement. Lymphadenopathy could also have this appearance. Electronically Signed   By: Misty Stanley M.D.   On: 06/05/2018 20:40   Ct Head Wo Contrast  Result Date: 06/05/2018 CLINICAL DATA:  Incontinence EXAM: CT HEAD WITHOUT CONTRAST TECHNIQUE: Contiguous axial images were obtained from the base of the skull through the vertex without intravenous contrast. COMPARISON:  None. FINDINGS: Brain: Chronic white matter ischemic changes are noted bilaterally. No findings to suggest acute hemorrhage, acute infarction or space-occupying mass lesion are seen. Vascular: No hyperdense vessel or unexpected calcification. Skull: Normal. Negative for fracture or focal lesion. Sinuses/Orbits: No acute finding. Other: None. IMPRESSION: Chronic white matter ischemic changes. No acute abnormality is noted. Electronically Signed   By: Inez Catalina M.D.   On: 06/05/2018 20:39    Assessment/Plan: 73 year old male with history of papillary thyroid cancer, afib on Eliquis, presenting with diarrhea in setting of recent antibiotic exposure (doxycycline for left hip abscess)  and heme positive stool. Previously hospitalized April 2020 at [facility] with colonoscopy due to rectal bleeding 4/23 noting diverticulosis. Hgb remaining stable. Cdiff not collected but GI pathogen panel in process. Doubt infectious process. CT with stercoral colitis and rectal impaction. BM overnight per nursing staff. No overt GI bleeding. Diarrhea multifactorial in setting of antibiotic exposure, fecal impaction now clinically resolved.   Will sign off on patient as stable from a GI standpoint.  [name], PhD, ANP-BC [facility]      LOS: 0 days    06/07/2018, 8:04 AM

## 2018-06-07 NOTE — Progress Notes (Signed)
Physical Therapy Treatment Patient Details Name: Paul Bradley MRN: [id number] DOB: November 21, 1945 Today's Date: 06/07/2018    History of Present Illness Paul Bradley is a 73 y.o. 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  Patient is an extremely poor historian but he tells me that he was seen at [facility] 2 days ago emergency room yesterday and today, complains of persistent weakness.  He is barely able to get out of bed and walk.  He also has noticed several loose bloody bowel movements, however none today.  He was admitted in April 2020 at [facility] for hematochezia and hypocalcemia, and underwent a colonoscopy on 4/23 with evidence of diverticulosis but no active bleeding or masses.  There was concern for diffuse dilatation of the cecum on imaging.  Patient denies any fever or chills, denies any chest pain, denies any vomiting but does complain of a very poor appetite.    PT Comments    Pt supine in bed and willing to participate with therapy today.  Pt with min to mod A with bed mobility and transfer training, increased time to complete.  Pt able to follow commands with hand placement to assist with sit to stands.  Pt with bowel movement in bed, ambulated to restroom that required increased A for sit to stand due to lower surface and LE weakness.  Good standing balance while NT cleansed following restroom break with use of RW.  Able to increased distance with gait training today with use of RW for balance and UE support.  Slow cadence with no LOB episodes.  EOS pt left in chair with call bell within reach, chair alarm set and RN aware of status.     Follow Up Recommendations  SNF     Equipment Recommendations       Recommendations for Other Services        Precautions / Restrictions Precautions Precautions: Fall Restrictions Weight Bearing Restrictions: No    Mobility  Bed Mobility Overal bed mobility: Modified Independent Bed Mobility: Supine to Sit     Supine to sit: Min assist     General bed mobility comments: Patient using bedrail and requiring min A for supine to sit.  Slow labored movements  Transfers Overall transfer level: Needs assistance Equipment used: Rolling walker (2 wheeled) Transfers: Sit to/from Stand Sit to Stand: Mod assist         General transfer comment: Patient required mod A with use of RW to perform sit to from stand, with return to sitting, patient demonstrated poor eccentric control. When initially in standing patient demonstrated posterior lean requiring verbal and tactile cues to correct.   Ambulation/Gait Ambulation/Gait assistance: Min assist Gait Distance (Feet): 22 Feet Assistive device: Rolling walker (2 wheeled) Gait Pattern/deviations: Step-to pattern;Decreased stride length;Decreased step length - right;Decreased step length - left;Decreased stance time - right;Decreased stance time - left Gait velocity: Decreased, intermittent posterior lean with ambulation   General Gait Details: Slow labored movements   Stairs             Wheelchair Mobility    Modified Rankin (Stroke Patients Only)       Balance  Cognition Arousal/Alertness: Awake/alert Behavior During Therapy: WFL for tasks assessed/performed Overall Cognitive Status: No family/caregiver present to determine baseline cognitive functioning                                 General Comments: Oriented to person, DOB, not oriented to place      Exercises      General Comments        Pertinent Vitals/Pain Pain Assessment: No/denies pain    Home Living                      Prior Function            PT Goals (current  goals can now be found in the care plan section)      Frequency    Min 3X/week      PT Plan Current plan remains appropriate    Co-evaluation              AM-PAC PT "6 Clicks" Mobility   Outcome Measure  Help needed turning from your back to your side while in a flat bed without using bedrails?: A Lot Help needed moving from lying on your back to sitting on the side of a flat bed without using bedrails?: A Lot Help needed moving to and from a bed to a chair (including a wheelchair)?: A Lot Help needed standing up from a chair using your arms (e.g., wheelchair or bedside chair)?: A Lot Help needed to walk in hospital room?: A Little Help needed climbing 3-5 steps with a railing? : A Lot 6 Click Score: 13    End of Session Equipment Utilized During Treatment: Gait belt Activity Tolerance: Patient tolerated treatment well Patient left: in chair;with call bell/phone within reach;with chair alarm set(RN aware of status) Nurse Communication: Mobility status PT Visit Diagnosis: Unsteadiness on feet (R26.81);Other abnormalities of gait and mobility (R26.89);Muscle weakness (generalized) (M62.81)     Time: 9702-6378 PT Time Calculation (min) (ACUTE ONLY): 33 min  Charges:  $Therapeutic Activity: 23-37 mins                     [address], LPTA; CBIS [phone]  [name] 06/07/2018, 12:24 PM

## 2018-06-07 NOTE — Progress Notes (Signed)
Stage 2 

## 2018-06-07 NOTE — Care Management (Addendum)
[facility] can make bed offer, family willing to pay copay $178/day. However [facility] does not have a bed until Thursday. [facility] will also need family to bring [facility] medication with them to facility.   Discussed with [name], patients sister, will fax out to [facility], [place].

## 2018-06-08 DIAGNOSIS — K922 Gastrointestinal hemorrhage, unspecified: Secondary | ICD-10-CM | POA: Diagnosis present

## 2018-06-08 DIAGNOSIS — D62 Acute posthemorrhagic anemia: Secondary | ICD-10-CM | POA: Diagnosis present

## 2018-06-08 DIAGNOSIS — Z7901 Long term (current) use of anticoagulants: Secondary | ICD-10-CM | POA: Diagnosis not present

## 2018-06-08 DIAGNOSIS — I482 Chronic atrial fibrillation, unspecified: Secondary | ICD-10-CM

## 2018-06-08 DIAGNOSIS — K5790 Diverticulosis of intestine, part unspecified, without perforation or abscess without bleeding: Secondary | ICD-10-CM | POA: Diagnosis present

## 2018-06-08 DIAGNOSIS — K5791 Diverticulosis of intestine, part unspecified, without perforation or abscess with bleeding: Secondary | ICD-10-CM

## 2018-06-08 MED ORDER — TAMSULOSIN HCL 0.4 MG PO CAPS
0.4000 mg | ORAL_CAPSULE | Freq: Every day | ORAL | Status: DC
  Administered 2018-06-08: 0.4 mg via ORAL
  Filled 2018-06-08: qty 1

## 2018-06-08 MED ORDER — APIXABAN 5 MG PO TABS
5.0000 mg | ORAL_TABLET | Freq: Two times a day (BID) | ORAL | Status: DC
  Administered 2018-06-08 – 2018-06-09 (×2): 5 mg via ORAL
  Filled 2018-06-08 (×2): qty 1

## 2018-06-08 NOTE — Clinical Social Work Note (Signed)
Facility and sister, [name], advised that patient would likely discharge 06/09/2018.   [name] indicated that he plans on picking patient up in the morning at 11:30 for an appointment with his oncologist before going to the facility.     Kisean Rollo, Clydene Pugh, LCSW

## 2018-06-08 NOTE — Care Management (Signed)
Call from [facility], additional information given, patient will be  sent to their Medical Director for review. Will await confirmation of approval/denial.

## 2018-06-08 NOTE — Progress Notes (Signed)
Patient Demographics:    Paul Bradley, is a 73 y.o. male, DOB - [birth year], RSW:[id number]  Admit date - 06/05/2018   Admitting Physician Costin Karlyne Greenspan, MD  Outpatient Primary MD for the patient is [name], [name], MD  LOS - 0   Chief Complaint  Patient presents with  . 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 Hemoglobin is now down to 11.0,.... Continue to monitor H&H especially with restart of Eliquis transfuse as clinically indicated  2) chronic atrial fibrillation---  CHA2DS2- VASc score   is = 2 (age and HTN)   Which is  equal to = 2.2 % annual risk of stroke, okay to restart Eliquis on 06/08/2018 evening, watch H&H closely, continue Toprol-XL 25 mg daily for rate control  3) hypothyroidism--- stable, continue levothyroxine 150 mcg daily, check TSH with a.m. labs  4)HTN--stable at this time, continue Toprol-XL 25 mg daily, lisinopril/hctz on hold  5)BPH with LUTs--- continue Flomax 0.4 mg every afternoon  6) generalized  weakness/debility/fall risk----patient with orthostatic dizziness in the setting of acute GI blood loss due to presumed diverticular bleed.... Physical therapy evaluation appreciated, they recommend SNF rehab.     Disposition/Need for in-Hospital Stay- patient unable to be discharged at this time due to dizziness and risk of Falls.Marland KitchenMarland Kitchen..awaiting SNF bed  Code Status : Full  Family Communication:   na  Disposition Plan  : SNF rehab  Consults  :  EDP d/w Dr Oneida Alar (GI)...  DVT Prophylaxis  :  Eliquis  Lab Results  Component Value Date   PLT 216 06/07/2018    Inpatient Medications  Scheduled Meds: . apixaban  5 mg Oral BID  . levothyroxine  150 mcg Oral QAC breakfast  . metoprolol succinate  25 mg Oral Daily  . tamsulosin  0.4 mg Oral QPC supper   Continuous Infusions: PRN Meds:.hydrALAZINE    Anti-infectives (From admission, onward)   None        Objective:   Vitals:   06/07/18 0619 06/07/18 2132 06/08/18 0551 06/08/18 1346  BP: (!) 141/95 131/88 (!) 130/98 122/88  Pulse: 89 75 87 (!) 101  Resp: 20 18 16 17   Temp: (!) 97.5 F (36.4 C) 97.8 F (36.6 C) 98.3 F (36.8 C) 98.2 F (36.8 C)  TempSrc: Oral Oral Oral   SpO2: 100% 99% 100% 99%  Weight:      Height:  Wt Readings from Last 3 Encounters:  06/05/18 74.5 kg  06/04/18 70.8 kg  02/06/18 83.5 kg     Intake/Output Summary (Last 24 hours) at 06/08/2018 1518 Last data filed at 06/08/2018 1300 Gross per 24 hour  Intake 720 ml  Output 3325 ml  Net -2605 ml     Physical Exam Patient is examined daily including today on 06/08/18 , exams remain the same as of yesterday except that has changed   Gen:- Awake Alert,  In no apparent distress  HEENT:- [place].AT, No sclera icterus Neck-Supple Neck,No JVD,.  Lungs-  CTAB , fair symmetrical air movement CV- S1, S2 normal, irregular  Abd-  +ve B.Sounds, Abd Soft, No tenderness,    Extremity/Skin:- No  edema, pedal pulses present  Psych-affect is appropriate,  oriented x3 Neuro-no new focal deficits, no tremors   Data Review:   Micro Results Recent Results (from the past 240 hour(s))  Urine culture     Status: None   Collection Time: 06/05/18  6:20 PM  Result Value Ref Range Status   Specimen Description   Final    URINE, CATHETERIZED Performed at [facility], [address] [place], [place] [address]    Special Requests   Final    NONE Performed at [facility], [address] [place], [place] [address]    Culture   Final    NO GROWTH Performed at [facility], [place] [address], [place], [place] [address]    Report Status 06/06/2018 FINAL  Final  SARS Coronavirus 2 (CEPHEID - Performed in [place] hospital lab), Hosp Order     Status: None   Collection Time: 06/05/18  6:23 PM  Result Value Ref Range Status   SARS Coronavirus 2 NEGATIVE NEGATIVE Final    Comment: (NOTE) If result is NEGATIVE SARS-CoV-2 target nucleic acids are NOT DETECTED. The SARS-CoV-2 RNA is generally detectable in upper and lower  respiratory specimens during the acute phase of infection. The lowest  concentration of SARS-CoV-2 viral copies this assay can detect is 250  copies / mL. A negative result does not preclude SARS-CoV-2 infection  and should not be used as the sole basis for treatment or other  patient management decisions.  A negative result may occur with  improper specimen collection / handling, submission of specimen other  than nasopharyngeal swab, presence of viral mutation(s) within the  areas targeted by this assay, and inadequate number of viral copies  (<250 copies / mL). A negative result must be combined with clinical  observations, patient history, and epidemiological information. If result is POSITIVE SARS-CoV-2 target nucleic acids are DETECTED. The SARS-CoV-2 RNA is generally detectable in upper and lower  respiratory specimens dur ing the acute phase of infection.  Positive  results are indicative of active infection with  SARS-CoV-2.  Clinical  correlation with patient history and other diagnostic information is  necessary to determine patient infection status.  Positive results do  not rule out bacterial infection or co-infection with other viruses. If result is PRESUMPTIVE POSTIVE SARS-CoV-2 nucleic acids MAY BE PRESENT.   A presumptive positive result was obtained on the submitted specimen  and confirmed on repeat testing.  While 2019 novel coronavirus  (SARS-CoV-2) nucleic acids may be present in the submitted sample  additional confirmatory testing may be necessary for epidemiological  and / or clinical management purposes  to differentiate between  SARS-CoV-2 and other Sarbecovirus currently known to infect humans.  If clinically indicated additional testing with an  alternate test  methodology [phone]) is advised. The SARS-CoV-2 RNA is generally  detectable in upper and lower respiratory sp ecimens during the acute  phase of infection. The expected result is Negative. Fact Sheet for Patients:  StrictlyIdeas.no Fact Sheet for Healthcare Providers: BankingDealers.co.za This test is not yet approved or cleared by the [place] FDA and has been authorized for detection and/or diagnosis of SARS-CoV-2 by FDA under an Emergency Use Authorization (EUA).  This EUA will remain in effect (meaning this test can be used) for the duration of the COVID-19 declaration under Section 564(b)(1) of the Act, 21 U.S.C. section 360bbb-3(b)(1), unless the authorization is terminated or revoked sooner. Performed at [facility], [address] [place], [place] [address]   Gastrointestinal Panel by PCR , Stool     Status: None   Collection Time: 06/06/18  3:20 AM  Result Value Ref Range Status   Campylobacter species NOT DETECTED NOT DETECTED Final   Plesimonas shigelloides NOT DETECTED NOT DETECTED Final   Salmonella species NOT DETECTED NOT DETECTED Final   Yersinia  enterocolitica NOT DETECTED NOT DETECTED Final   Vibrio species NOT DETECTED NOT DETECTED Final   Vibrio cholerae NOT DETECTED NOT DETECTED Final   Enteroaggregative E coli (EAEC) NOT DETECTED NOT DETECTED Final   Enteropathogenic E coli (EPEC) NOT DETECTED NOT DETECTED Final   Enterotoxigenic E coli (ETEC) NOT DETECTED NOT DETECTED Final   Shiga like toxin producing E coli (STEC) NOT DETECTED NOT DETECTED Final   Shigella/Enteroinvasive E coli (EIEC) NOT DETECTED NOT DETECTED Final   Cryptosporidium NOT DETECTED NOT DETECTED Final   Cyclospora cayetanensis NOT DETECTED NOT DETECTED Final   Entamoeba histolytica NOT DETECTED NOT DETECTED Final   Giardia lamblia NOT DETECTED NOT DETECTED Final   Adenovirus F40/41 NOT DETECTED NOT DETECTED Final   Astrovirus NOT DETECTED NOT DETECTED Final   Norovirus GI/GII NOT DETECTED NOT DETECTED Final   Rotavirus A NOT DETECTED NOT DETECTED Final   Sapovirus (I, II, IV, and V) NOT DETECTED NOT DETECTED Final    Comment: Performed at [facility], [address] [place], [place] [address]    Radiology Reports Ct Abdomen Pelvis Wo Contrast  Result Date: 06/05/2018 CLINICAL DATA:  Diarrhea and abdominal pain EXAM: CT ABDOMEN AND PELVIS WITHOUT CONTRAST TECHNIQUE: Multidetector CT imaging of the abdomen and pelvis was performed following the standard protocol without IV contrast. COMPARISON:  11/06/2009 FINDINGS: Lower chest: Lungs are well aerated bilaterally. There is a 11 mm nodule identified in the right lower lobe. Additionally a 10 mm subpleural nodule is noted in the left lower lobe. Hepatobiliary: Cholelithiasis is noted without complicating factors. The liver is within normal limits. Pancreas: Unremarkable. No pancreatic ductal dilatation or surrounding inflammatory changes. Spleen: Normal in size without focal abnormality. Adrenals/Urinary Tract: Adrenal glands are within normal limits. Kidneys are well visualized bilaterally. Few scattered  hypodensities are noted likely representing cysts but incompletely evaluated on this exam. No renal calculi or obstructive changes are seen. The bladder is decompressed secondary to a Foley catheter. Wall thickening is seen consistent with the decompression. Stomach/Bowel: Changes consistent with rectal impaction are noted. Some very mild Peri rectal inflammatory changes are seen consistent with stercoral colitis. The more proximal colon shows some retained fecal material although no other changes of constipation are seen. The appendix is within normal limits. Small bowel and stomach are unremarkable. Vascular/Lymphatic: Aortic atherosclerosis. No enlarged abdominal or pelvic lymph nodes. Reproductive: Prostate is unremarkable. Other: No abdominal wall hernia or  abnormality. No abdominopelvic ascites. Musculoskeletal: Degenerative changes of lumbar spine are noted. IMPRESSION: Bilateral lower lobe nodules measuring up to 11 mm. The possibility of metastatic disease deserves consideration given a history of prior adenocarcinoma. Non-contrast chest CT at 3-6 months is recommended. If the nodules are stable at time of repeat CT, then future CT at 18-24 months (from today's scan) is considered optional for low-risk patients, but is recommended for high-risk patients. This recommendation follows the consensus statement: Guidelines for Management of Incidental Pulmonary Nodules Detected on CT Images: From the Fleischner Society 2017; Radiology 2017; 284:228-243. 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 Cholelithiasis without complicating factors. Electronically Signed   By: Inez Catalina M.D.   On: 06/05/2018 20:45   Dg Chest 1 View  Result Date: 06/05/2018 CLINICAL DATA:  Diarrhea. EXAM: CHEST  1 VIEW COMPARISON:  06/04/2018 FINDINGS: Low lung volumes. The cardio pericardial silhouette is enlarged. Similar prominence of the right paratracheal stripe. Interstitial markings are diffusely coarsened with chronic  features. Prominent skin fold noted over the left hemithorax. No focal consolidation or pleural effusion. The visualized bony structures of the thorax are intact. Telemetry leads overlie the chest. IMPRESSION: Low volume film without acute cardiopulmonary findings. Stable appearance of prominent right paratracheal opacity, potentially related to ectatic arch vessel anatomy and/or thyroid enlargement. Lymphadenopathy could also have this appearance. Electronically Signed   By: Misty Stanley M.D.   On: 06/05/2018 20:40   Ct Head Wo Contrast  Result Date: 06/05/2018 CLINICAL DATA:  Incontinence EXAM: CT HEAD WITHOUT CONTRAST TECHNIQUE: Contiguous axial images were obtained from the base of the skull through the vertex without intravenous contrast. COMPARISON:  None. FINDINGS: Brain: Chronic white matter ischemic changes are noted bilaterally. No findings to suggest acute hemorrhage, acute infarction or space-occupying mass lesion are seen. Vascular: No hyperdense vessel or unexpected calcification. Skull: Normal. Negative for fracture or focal lesion. Sinuses/Orbits: No acute finding. Other: None. IMPRESSION: Chronic white matter ischemic changes. No acute abnormality is noted. Electronically Signed   By: Inez Catalina M.D.   On: 06/05/2018 20:39   Dg Chest Port 1 View  Result Date: 06/04/2018 CLINICAL DATA:  Weakness.  Thyroid cancer. EXAM: PORTABLE CHEST 1 VIEW COMPARISON:  02/06/2018 FINDINGS: Heart size upper normal. Negative for heart failure. No infiltrate or effusion Surgical clips in the region of the thyroid. Prominent soft tissue on both sides of the trachea could represent substernal goiter or adenopathy. Note is made of goiter on the CT of 11/06/2009 however there has been interval thyroidectomy. IMPRESSION: Prominent superior mediastinal soft tissue compatible with goiter or adenopathy. Recommend CT neck and chest with contrast. Electronically Signed   By: Franchot Gallo M.D.   On: 06/04/2018 09:01      CBC Recent Labs  Lab 06/04/18 0900 06/05/18 1850 06/06/18 0627 06/07/18 0759  WBC 6.7 9.6 8.2 7.7  HGB 13.6 11.8* 11.9* 11.0*  HCT 41.2 35.0* 37.2* 33.1*  PLT 263 240 231 216  MCV 93.6 93.6 95.6 94.3  MCH 30.9 31.6 30.6 31.3  MCHC 33.0 33.7 32.0 33.2  RDW 15.2 15.0 15.0 15.2  LYMPHSABS 1.2 1.3  --   --   MONOABS 0.6 1.0  --   --   EOSABS 0.0 0.0  --   --   BASOSABS 0.1 0.0  --   --     Chemistries  Recent Labs  Lab 06/04/18 0900 06/05/18 1850 06/06/18 0627 06/07/18 0759  NA 139 130* 137 137  K 3.5 3.0* 3.1* 3.4*  CL 98 91* 99 103  CO2 30 29 27 26   GLUCOSE 105* 113* 91 99  BUN 28* 30* 25* 18  CREATININE 1.18 1.26* 1.03 0.91  CALCIUM 12.1* 10.7* 10.1 8.7*  MG 2.0 1.8  --  1.7  AST 40 37 37  --   ALT 19 18 18   --   ALKPHOS 43 40 39  --   BILITOT 0.8 1.0 0.8  --    ------------------------------------------------------------------------------------------------------------------ No results for input(s): CHOL, HDL, LDLCALC, TRIG, CHOLHDL, LDLDIRECT in the last 72 hours.  No results found for: HGBA1C ------------------------------------------------------------------------------------------------------------------ No results for input(s): TSH, T4TOTAL, T3FREE, THYROIDAB in the last 72 hours.  Invalid input(s): FREET3 ------------------------------------------------------------------------------------------------------------------ No results for input(s): VITAMINB12, FOLATE, FERRITIN, TIBC, IRON, RETICCTPCT in the last 72 hours.  Coagulation profile Recent Labs  Lab 06/05/18 1850  INR 1.2    No results for input(s): DDIMER in the last 72 hours.  Cardiac Enzymes Recent Labs  Lab 06/05/18 1850  TROPONINI <0.03   ------------------------------------------------------------------------------------------------------------------ No results found for: BNP   Roxan Hockey M.D on 06/08/2018 at 3:18 PM  Go to [email or web address] - for contact info  [facility] - Office  [phone]

## 2018-06-08 NOTE — Care Management (Addendum)
Insurance approval # is [id number]. Christy at [facility] notified. Will arrange EMS transport when DC summary updated.  Attending and bedside RN notified. Family updated.

## 2018-06-09 DIAGNOSIS — I482 Chronic atrial fibrillation, unspecified: Secondary | ICD-10-CM | POA: Diagnosis not present

## 2018-06-09 DIAGNOSIS — D62 Acute posthemorrhagic anemia: Secondary | ICD-10-CM

## 2018-06-09 DIAGNOSIS — K5791 Diverticulosis of intestine, part unspecified, without perforation or abscess with bleeding: Secondary | ICD-10-CM | POA: Diagnosis not present

## 2018-06-09 DIAGNOSIS — Z7901 Long term (current) use of anticoagulants: Secondary | ICD-10-CM | POA: Diagnosis not present

## 2018-06-09 LAB — BASIC METABOLIC PANEL
Anion gap: 11 (ref 5–15)
BUN: 13 mg/dL (ref 8–23)
CO2: 27 mmol/L (ref 22–32)
Calcium: 8 mg/dL — ABNORMAL LOW (ref 8.9–10.3)
Chloride: 100 mmol/L (ref 98–111)
Creatinine, Ser: 0.99 mg/dL (ref 0.61–1.24)
GFR calc Af Amer: >60 mL/min (ref >60)
GFR calc non Af Amer: >60 mL/min (ref >60)
Glucose, Bld: 100 mg/dL — ABNORMAL HIGH (ref 70–99)
Potassium: 3.2 mmol/L — ABNORMAL LOW (ref 3.5–5.1)
Sodium: 138 mmol/L (ref 135–145)

## 2018-06-09 LAB — CBC
HCT: 33.7 % — ABNORMAL LOW (ref 39.0–52.0)
Hemoglobin: 11 g/dL — ABNORMAL LOW (ref 13.0–17.0)
MCH: 31.1 pg (ref 26.0–34.0)
MCHC: 32.6 g/dL (ref 30.0–36.0)
MCV: 95.2 fL (ref 80.0–100.0)
Platelets: 240 10*3/uL (ref 150–400)
RBC: 3.54 MIL/uL — ABNORMAL LOW (ref 4.22–5.81)
RDW: 14.7 % (ref 11.5–15.5)
WBC: 6.6 10*3/uL (ref 4.0–10.5)
nRBC: 0 % (ref 0.0–0.2)

## 2018-06-09 LAB — TSH: TSH: 5.376 u[IU]/mL — ABNORMAL HIGH (ref 0.350–4.500)

## 2018-06-09 MED ORDER — TAMSULOSIN HCL 0.4 MG PO CAPS: 0.4000 mg | ORAL_CAPSULE | Freq: Every day | ORAL | 3 refills | Status: DC

## 2018-06-09 MED ORDER — POTASSIUM CHLORIDE CRYS ER 20 MEQ PO TBCR
40.0000 meq | EXTENDED_RELEASE_TABLET | Freq: Once | ORAL | Status: AC
  Administered 2018-06-09: 09:00:00 40 meq via ORAL
  Filled 2018-06-09: qty 2

## 2018-06-09 NOTE — Progress Notes (Signed)
PT Cancellation Note  Patient Details Name: Paul Bradley MRN: [id number] DOB: 05/04/1945   Cancelled Treatment:    Reason Eval/Treat Not Completed: Patient declined, no reason specified Attempted PT session multiple times.  Pt declined, stated he is ready to go home.  Pt left in bed with call bell wihtin reach and bed alarm set.  [address] LPTA; [place]  [name] 06/09/2018, 11:55 AM

## 2018-06-09 NOTE — Progress Notes (Signed)
Patient Demographics:    Paul Bradley, is a 73 y.o. male, DOB - June 20, 1945, NWG:[id number]  Admit date - 06/05/2018   Admitting Physician Costin Karlyne Greenspan, MD  Outpatient Primary MD for the patient is [name], [name], MD  LOS - 0   Chief Complaint  Patient presents with   Diarrhea        Subjective:    Paul Bradley today has no fevers, no emesis,  No chest pain, patient feels better, eating and drinking well okay to discharge to SNF rehab--- please see full discharge summary  Assessment  & Plan :    Principal Problem:   GI bleed Active Problems:   Chronic a-fib on Eliquis   Anticoagulant long-term use   Generalized weakness   Diarrhea   Diverticulosis   Acute blood loss anemia---  Gi Bleed  Brief Summary 72 y.o.malewith medical history significant ofpapillary thyroid cancer followed at [facility], 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 Hemoglobin is now down to 11.0,.... Eliquis restarted 06/08/2018  2) chronic atrial fibrillation---  CHA2DS2- VASc score   is = 2 (age and HTN)   Which is  equal to = 2.2 % annual risk of stroke, restarted Eliquis on 06/08/2018 , continue Toprol-XL 25 mg daily for rate control  3) hypothyroidism--- stable, continue levothyroxine 150 mcg daily,   4)HTN--stable at this time, continue Toprol-XL 25 mg daily,   5)BPH with LUTs--- continue Flomax 0.4 mg  6) generalized weakness/debility/fall risk----patient with orthostatic dizziness in the setting of acute GI blood loss due to  presumed diverticular bleed.... Physical therapy evaluation appreciated, they recommend SNF rehab.      Code Status : Full  Family Communication:   Sister Disposition Plan  : SNF rehab  Consults  :  EDP d/w Dr Oneida Alar (GI)...  DVT Prophylaxis  :  Eliquis  Lab Results  Component Value Date   PLT 240 06/09/2018    Inpatient Medications  Scheduled Meds:  apixaban  5 mg Oral BID   levothyroxine  150 mcg Oral QAC breakfast   metoprolol succinate  25 mg Oral Daily   tamsulosin  0.4 mg Oral QPC supper   Continuous Infusions: PRN Meds:.hydrALAZINE    Anti-infectives (From admission, onward)   None        Objective:   Vitals:   06/08/18 1346 06/08/18 1919 06/08/18 2055 06/09/18 0524  BP: 122/88  133/89 129/88  Pulse: (!) 101  (!) 106 83  Resp: 17  17 16   Temp: 98.2 F (36.8 C)  97.7 F (36.5 C) 98.1 F (36.7 C)  TempSrc:   Oral   SpO2: 99% 96% 99% 99%  Weight:      Height:        Wt Readings from Last 3 Encounters:  06/05/18 74.5 kg  06/04/18 70.8 kg  02/06/18 83.5 kg     Intake/Output Summary (Last 24 hours) at 06/09/2018 1006 Last data filed at 06/09/2018  0900 Gross per 24 hour  Intake 720 ml  Output 4050 ml  Net -3330 ml     Physical Exam Patient is examined daily including today on 06/09/18 , exams remain the same as of yesterday except that has changed   Gen:- Awake Alert,  In no apparent distress  HEENT:- [place].AT, No sclera icterus Neck-Supple Neck,No JVD,.  Lungs-  CTAB , fair symmetrical air movement CV- S1, S2 normal, irregular  Abd-  +ve B.Sounds, Abd Soft, No tenderness,    Extremity/Skin:- No  edema, pedal pulses present  Psych-affect is appropriate, oriented x3 Neuro-no new focal deficits, no tremors   Data Review:   Micro Results Recent Results (from the past 240 hour(s))  Urine culture     Status: None   Collection Time: 06/05/18  6:20 PM  Result Value Ref Range Status   Specimen Description   Final    URINE,  CATHETERIZED Performed at [facility], [address], [place], [place] [address]    Special Requests   Final    NONE Performed at [facility], [address], [place], [place] [address]    Culture   Final    NO GROWTH Performed at [facility], [place] [address], [place], [place] [address]    Report Status 06/06/2018 FINAL  Final  SARS Coronavirus 2 (CEPHEID - Performed in [facility] hospital lab), Hosp Order     Status: None   Collection Time: 06/05/18  6:23 PM  Result Value Ref Range Status   SARS Coronavirus 2 NEGATIVE NEGATIVE Final    Comment: (NOTE) If result is NEGATIVE SARS-CoV-2 target nucleic acids are NOT DETECTED. The SARS-CoV-2 RNA is generally detectable in upper and lower  respiratory specimens during the acute phase of infection. The lowest  concentration of SARS-CoV-2 viral copies this assay can detect is 250  copies / mL. A negative result does not preclude SARS-CoV-2 infection  and should not be used as the sole basis for treatment or other  patient management decisions.  A negative result may occur with  improper specimen collection / handling, submission of specimen other  than nasopharyngeal swab, presence of viral mutation(s) within the  areas targeted by this assay, and inadequate number of viral copies  (<250 copies / mL). A negative result must be combined with clinical  observations, patient history, and epidemiological information. If result is POSITIVE SARS-CoV-2 target nucleic acids are DETECTED. The SARS-CoV-2 RNA is generally detectable in upper and lower  respiratory specimens dur ing the acute phase of infection.  Positive  results are indicative of active infection with SARS-CoV-2.  Clinical  correlation with patient history and other diagnostic information is  necessary to determine patient infection status.  Positive results do  not rule out bacterial infection or co-infection with other viruses. If result is PRESUMPTIVE  POSTIVE SARS-CoV-2 nucleic acids MAY BE PRESENT.   A presumptive positive result was obtained on the submitted specimen  and confirmed on repeat testing.  While 2019 novel coronavirus  (SARS-CoV-2) nucleic acids may be present in the submitted sample  additional confirmatory testing may be necessary for epidemiological  and / or clinical management purposes  to differentiate between  SARS-CoV-2 and other Sarbecovirus currently known to infect humans.  If clinically indicated additional testing with an alternate test  methodology [phone]) is advised. The SARS-CoV-2 RNA is generally  detectable in upper and lower respiratory sp ecimens during the acute  phase of infection. The expected result is Negative. Fact Sheet  for Patients:  StrictlyIdeas.no Fact Sheet for Healthcare Providers: BankingDealers.co.za This test is not yet approved or cleared by the [place] FDA and has been authorized for detection and/or diagnosis of SARS-CoV-2 by FDA under an Emergency Use Authorization (EUA).  This EUA will remain in effect (meaning this test can be used) for the duration of the COVID-19 declaration under Section 564(b)(1) of the Act, 21 U.S.C. section 360bbb-3(b)(1), unless the authorization is terminated or revoked sooner. Performed at [facility], [address] [place], [place] [address]   Gastrointestinal Panel by PCR , Stool     Status: None   Collection Time: 06/06/18  3:20 AM  Result Value Ref Range Status   Campylobacter species NOT DETECTED NOT DETECTED Final   Plesimonas shigelloides NOT DETECTED NOT DETECTED Final   Salmonella species NOT DETECTED NOT DETECTED Final   Yersinia enterocolitica NOT DETECTED NOT DETECTED Final   Vibrio species NOT DETECTED NOT DETECTED Final   Vibrio cholerae NOT DETECTED NOT DETECTED Final   Enteroaggregative E coli (EAEC) NOT DETECTED NOT DETECTED Final   Enteropathogenic E coli (EPEC) NOT DETECTED  NOT DETECTED Final   Enterotoxigenic E coli (ETEC) NOT DETECTED NOT DETECTED Final   Shiga like toxin producing E coli (STEC) NOT DETECTED NOT DETECTED Final   Shigella/Enteroinvasive E coli (EIEC) NOT DETECTED NOT DETECTED Final   Cryptosporidium NOT DETECTED NOT DETECTED Final   Cyclospora cayetanensis NOT DETECTED NOT DETECTED Final   Entamoeba histolytica NOT DETECTED NOT DETECTED Final   Giardia lamblia NOT DETECTED NOT DETECTED Final   Adenovirus F40/41 NOT DETECTED NOT DETECTED Final   Astrovirus NOT DETECTED NOT DETECTED Final   Norovirus GI/GII NOT DETECTED NOT DETECTED Final   Rotavirus A NOT DETECTED NOT DETECTED Final   Sapovirus (I, II, IV, and V) NOT DETECTED NOT DETECTED Final    Comment: Performed at [facility], [address] [place], [place] [address]    Radiology Reports Ct Abdomen Pelvis Wo Contrast  Result Date: 06/05/2018 CLINICAL DATA:  Diarrhea and abdominal pain EXAM: CT ABDOMEN AND PELVIS WITHOUT CONTRAST TECHNIQUE: Multidetector CT imaging of the abdomen and pelvis was performed following the standard protocol without IV contrast. COMPARISON:  11/06/2009 FINDINGS: Lower chest: Lungs are well aerated bilaterally. There is a 11 mm nodule identified in the right lower lobe. Additionally a 10 mm subpleural nodule is noted in the left lower lobe. Hepatobiliary: Cholelithiasis is noted without complicating factors. The liver is within normal limits. Pancreas: Unremarkable. No pancreatic ductal dilatation or surrounding inflammatory changes. Spleen: Normal in size without focal abnormality. Adrenals/Urinary Tract: Adrenal glands are within normal limits. Kidneys are well visualized bilaterally. Few scattered hypodensities are noted likely representing cysts but incompletely evaluated on this exam. No renal calculi or obstructive changes are seen. The bladder is decompressed secondary to a Foley catheter. Wall thickening is seen consistent with the decompression.  Stomach/Bowel: Changes consistent with rectal impaction are noted. Some very mild Peri rectal inflammatory changes are seen consistent with stercoral colitis. The more proximal colon shows some retained fecal material although no other changes of constipation are seen. The appendix is within normal limits. Small bowel and stomach are unremarkable. Vascular/Lymphatic: Aortic atherosclerosis. No enlarged abdominal or pelvic lymph nodes. Reproductive: Prostate is unremarkable. Other: No abdominal wall hernia or abnormality. No abdominopelvic ascites. Musculoskeletal: Degenerative changes of lumbar spine are noted. IMPRESSION: Bilateral lower lobe nodules measuring up to 11 mm. The possibility of metastatic disease deserves consideration given a history of prior adenocarcinoma.  Non-contrast chest CT at 3-6 months is recommended. If the nodules are stable at time of repeat CT, then future CT at 18-24 months (from today's scan) is considered optional for low-risk patients, but is recommended for high-risk patients. This recommendation follows the consensus statement: Guidelines for Management of Incidental Pulmonary Nodules Detected on CT Images: From the Fleischner Society 2017; Radiology 2017; 284:228-243. 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 Cholelithiasis without complicating factors. Electronically Signed   By: Inez Catalina M.D.   On: 06/05/2018 20:45   Dg Chest 1 View  Result Date: 06/05/2018 CLINICAL DATA:  Diarrhea. EXAM: CHEST  1 VIEW COMPARISON:  06/04/2018 FINDINGS: Low lung volumes. The cardio pericardial silhouette is enlarged. Similar prominence of the right paratracheal stripe. Interstitial markings are diffusely coarsened with chronic features. Prominent skin fold noted over the left hemithorax. No focal consolidation or pleural effusion. The visualized bony structures of the thorax are intact. Telemetry leads overlie the chest. IMPRESSION: Low volume film without acute cardiopulmonary  findings. Stable appearance of prominent right paratracheal opacity, potentially related to ectatic arch vessel anatomy and/or thyroid enlargement. Lymphadenopathy could also have this appearance. Electronically Signed   By: Misty Stanley M.D.   On: 06/05/2018 20:40   Ct Head Wo Contrast  Result Date: 06/05/2018 CLINICAL DATA:  Incontinence EXAM: CT HEAD WITHOUT CONTRAST TECHNIQUE: Contiguous axial images were obtained from the base of the skull through the vertex without intravenous contrast. COMPARISON:  None. FINDINGS: Brain: Chronic white matter ischemic changes are noted bilaterally. No findings to suggest acute hemorrhage, acute infarction or space-occupying mass lesion are seen. Vascular: No hyperdense vessel or unexpected calcification. Skull: Normal. Negative for fracture or focal lesion. Sinuses/Orbits: No acute finding. Other: None. IMPRESSION: Chronic white matter ischemic changes. No acute abnormality is noted. Electronically Signed   By: Inez Catalina M.D.   On: 06/05/2018 20:39   Dg Chest Port 1 View  Result Date: 06/04/2018 CLINICAL DATA:  Weakness.  Thyroid cancer. EXAM: PORTABLE CHEST 1 VIEW COMPARISON:  02/06/2018 FINDINGS: Heart size upper normal. Negative for heart failure. No infiltrate or effusion Surgical clips in the region of the thyroid. Prominent soft tissue on both sides of the trachea could represent substernal goiter or adenopathy. Note is made of goiter on the CT of 11/06/2009 however there has been interval thyroidectomy. IMPRESSION: Prominent superior mediastinal soft tissue compatible with goiter or adenopathy. Recommend CT neck and chest with contrast. Electronically Signed   By: Franchot Gallo M.D.   On: 06/04/2018 09:01     CBC Recent Labs  Lab 06/04/18 0900 06/05/18 1850 06/06/18 0627 06/07/18 0759 06/09/18 0459  WBC 6.7 9.6 8.2 7.7 6.6  HGB 13.6 11.8* 11.9* 11.0* 11.0*  HCT 41.2 35.0* 37.2* 33.1* 33.7*  PLT 263 240 231 216 240  MCV 93.6 93.6 95.6 94.3  95.2  MCH 30.9 31.6 30.6 31.3 31.1  MCHC 33.0 33.7 32.0 33.2 32.6  RDW 15.2 15.0 15.0 15.2 14.7  LYMPHSABS 1.2 1.3  --   --   --   MONOABS 0.6 1.0  --   --   --   EOSABS 0.0 0.0  --   --   --   BASOSABS 0.1 0.0  --   --   --     Chemistries  Recent Labs  Lab 06/04/18 0900 06/05/18 1850 06/06/18 0627 06/07/18 0759 06/09/18 0459  NA 139 130* 137 137 138  K 3.5 3.0* 3.1* 3.4* 3.2*  CL 98 91* 99 103 100  CO2  30 29 27 26 27   GLUCOSE 105* 113* 91 99 100*  BUN 28* 30* 25* 18 13  CREATININE 1.18 1.26* 1.03 0.91 0.99  CALCIUM 12.1* 10.7* 10.1 8.7* 8.0*  MG 2.0 1.8  --  1.7  --   AST 40 37 37  --   --   ALT 19 18 18   --   --   ALKPHOS 43 40 39  --   --   BILITOT 0.8 1.0 0.8  --   --    ------------------------------------------------------------------------------------------------------------------ No results for input(s): CHOL, HDL, LDLCALC, TRIG, CHOLHDL, LDLDIRECT in the last 72 hours.  No results found for: HGBA1C ------------------------------------------------------------------------------------------------------------------ Recent Labs    06/08/18 2350  TSH 5.376*   ------------------------------------------------------------------------------------------------------------------ No results for input(s): VITAMINB12, FOLATE, FERRITIN, TIBC, IRON, RETICCTPCT in the last 72 hours.  Coagulation profile Recent Labs  Lab 06/05/18 1850  INR 1.2    No results for input(s): DDIMER in the last 72 hours.  Cardiac Enzymes Recent Labs  Lab 06/05/18 1850  TROPONINI <0.03   ------------------------------------------------------------------------------------------------------------------ No results found for: BNP   Roxan Hockey M.D on 06/09/2018 at 10:06 AM  Go to [email or web address] - for contact info  [facility] - Office  [phone]

## 2018-06-09 NOTE — Progress Notes (Signed)
Patient discharged to [facility],report called and given to Orlene Och LPN. Vital signs stable. Accompanied by staff to an awaiting vehicle.

## 2018-07-07 ENCOUNTER — Other Ambulatory Visit: Payer: Self-pay | Admitting: Radiology

## 2018-07-08 ENCOUNTER — Encounter (HOSPITAL_COMMUNITY): Payer: Self-pay

## 2018-07-08 ENCOUNTER — Ambulatory Visit (HOSPITAL_COMMUNITY)
Admission: RE | Admit: 2018-07-08 | Discharge: 2018-07-08 | Disposition: A | Discharge status: Home / Self Care | Payer: Medicare HMO | Source: Ambulatory Visit | Attending: Urology | Admitting: Urology

## 2018-07-08 ENCOUNTER — Other Ambulatory Visit: Payer: Self-pay

## 2018-07-08 DIAGNOSIS — K921 Melena: Secondary | ICD-10-CM | POA: Insufficient documentation

## 2018-07-08 DIAGNOSIS — Z79899 Other long term (current) drug therapy: Secondary | ICD-10-CM | POA: Diagnosis not present

## 2018-07-08 DIAGNOSIS — I4891 Unspecified atrial fibrillation: Secondary | ICD-10-CM | POA: Insufficient documentation

## 2018-07-08 DIAGNOSIS — Z7901 Long term (current) use of anticoagulants: Secondary | ICD-10-CM | POA: Insufficient documentation

## 2018-07-08 DIAGNOSIS — E119 Type 2 diabetes mellitus without complications: Secondary | ICD-10-CM | POA: Diagnosis not present

## 2018-07-08 DIAGNOSIS — I10 Essential (primary) hypertension: Secondary | ICD-10-CM | POA: Insufficient documentation

## 2018-07-08 DIAGNOSIS — R339 Retention of urine, unspecified: Secondary | ICD-10-CM | POA: Diagnosis not present

## 2018-07-08 DIAGNOSIS — E079 Disorder of thyroid, unspecified: Secondary | ICD-10-CM | POA: Insufficient documentation

## 2018-07-08 DIAGNOSIS — I2510 Atherosclerotic heart disease of native coronary artery without angina pectoris: Secondary | ICD-10-CM | POA: Diagnosis not present

## 2018-07-08 DIAGNOSIS — N401 Enlarged prostate with lower urinary tract symptoms: Secondary | ICD-10-CM | POA: Insufficient documentation

## 2018-07-08 LAB — CBC
HCT: 36.5 % — ABNORMAL LOW (ref 39.0–52.0)
Hemoglobin: 12 g/dL — ABNORMAL LOW (ref 13.0–17.0)
MCH: 31.4 pg (ref 26.0–34.0)
MCHC: 32.9 g/dL (ref 30.0–36.0)
MCV: 95.5 fL (ref 80.0–100.0)
Platelets: 176 10*3/uL (ref 150–400)
RBC: 3.82 MIL/uL — ABNORMAL LOW (ref 4.22–5.81)
RDW: 13.6 % (ref 11.5–15.5)
WBC: 9.5 10*3/uL (ref 4.0–10.5)
nRBC: 0 % (ref 0.0–0.2)

## 2018-07-08 LAB — PROTIME-INR
INR: 1.1 (ref 0.8–1.2)
Prothrombin Time: 13.9 seconds (ref 11.4–15.2)

## 2018-07-08 LAB — APTT: aPTT: 33 seconds (ref 24–36)

## 2018-07-08 MED ORDER — SODIUM CHLORIDE 0.9 % IV SOLN
INTRAVENOUS | Status: DC
  Administered 2018-07-08: 08:00:00 via INTRAVENOUS

## 2018-07-08 MED ORDER — VANCOMYCIN HCL IN DEXTROSE 1-5 GM/200ML-% IV SOLN
1000.0000 mg | Freq: Once | INTRAVENOUS | Status: DC
  Filled 2018-07-08: qty 200

## 2018-07-08 MED ORDER — MIDAZOLAM HCL 2 MG/2ML IJ SOLN
INTRAMUSCULAR | Status: AC | PRN
  Administered 2018-07-08 (×2): 1 mg via INTRAVENOUS

## 2018-07-08 MED ORDER — FENTANYL CITRATE (PF) 100 MCG/2ML IJ SOLN
INTRAMUSCULAR | Status: AC | PRN
  Administered 2018-07-08: 50 ug via INTRAVENOUS
  Administered 2018-07-08 (×2): 25 ug via INTRAVENOUS

## 2018-07-08 MED ORDER — VANCOMYCIN HCL 1000 MG IV SOLR
1000.0000 mg | Freq: Once | INTRAVENOUS | Status: AC
  Administered 2018-07-08: 1000 mg via INTRAVENOUS
  Filled 2018-07-08: qty 1000

## 2018-07-08 MED ORDER — MIDAZOLAM HCL 2 MG/2ML IJ SOLN
INTRAMUSCULAR | Status: AC
  Filled 2018-07-08: qty 6

## 2018-07-08 MED ORDER — FENTANYL CITRATE (PF) 100 MCG/2ML IJ SOLN
INTRAMUSCULAR | Status: AC
  Filled 2018-07-08: qty 4

## 2018-07-08 NOTE — Procedures (Signed)
Urinary retention, chronic foley  S/p CT 14 fr SP tube  No comp Stable To gravity bag Full report in pacs

## 2018-07-08 NOTE — Discharge Instructions (Signed)
Suprapubic Catheter Home Guide A suprapubic catheter is a rubber tube used to drain urine from the bladder into a collection bag. The catheter is inserted into the bladder through a small opening in the in the lower abdomen, near the center of the body, above the pubic bone (suprapubic area). There is a tiny balloon filled with germ-free (sterile) water on the end of the catheter that is in the bladder. The balloon helps to keep the catheter in place. Your suprapubic catheter may need to be replaced every 4-6 weeks, or as often as recommended by your health care provider. The collection bag must be emptied every day and cleaned every 2-3 days. The collection bag can be put beside your bed at night and attached to your leg during the day. You may have a large collection bag to use at night and a smaller one to use during the day. What are the risks?  Urine flow can become blocked. This can happen if the catheter is not working correctly, or if you have a blood clot in your bladder or in the catheter.  Tissue near the catheter may can become irritated and bleed.  Bacteria may get into your bladder and cause a urinary tract infection. How do I change the catheter? Supplies needed  Two pairs of sterile gloves.  Catheter.  Two syringes.  Sterile water.  Sterile cleaning solution.  Lubricant.  Collection bags. Changing the catheter To replace your catheter, take the following steps: 1. Drink plenty of fluids during the hours before you plan to change the catheter. 2. Wash your hands with soap and water. If soap and water are not available, use hand sanitizer. 3. Lie on your back and put on sterile gloves. 4. Clean the skin around the catheter opening using the sterile cleaning solution. 5. Remove the water from the balloon using a syringe. 6. Slowly remove the catheter. ? Do not pull on the catheter if it seems stuck. ? Call your health care provider immediately if you have difficulty  removing the catheter. 7. Take off the used gloves, and put on a new pair. 8. Put lubricant on the end of the new catheter that will go into your bladder. 9. Gently slide the catheter through the opening in your abdomen and into your bladder. 10. Wait for some urine to start flowing through the catheter. When urine starts to flow through the catheter, use a new syringe to fill the balloon with sterile water. 11. Attach the collection bag to the end of the catheter. Make sure the connection is tight. 12. Remove the gloves and wash your hands with soap and water. How do I care for my skin around the catheter? Use a clean washcloth and soapy water to clean the skin around your catheter every day. Pat the area dry with a clean towel.  Do not pull on the catheter.  Do not use ointment or lotion on this area unless told by your health care provider.  Check your skin around the catheter every day for signs of infection. Check for: ? Redness, swelling, or pain. ? Fluid or blood. ? Warmth. ? Pus or a bad smell. How do I clean and empty the collection bag? Clean the collection bag every 2-3 days, or as often as told by your health care provider. To do this, take the following steps:  Wash your hands with soap and water. If soap and water are not available, use hand sanitizer.  Disconnect the bag from the  catheter and immediately attach a new bag to the catheter. °· Empty the used bag completely. °· Clean the used bag using one of the following methods: °? Rinse the used bag with warm water and soap. °? Fill the bag with water and add 1 tsp of vinegar. Let it sit for about 30 minutes, then empty the bag. °· Let the bag dry completely, and put it in a clean plastic bag before storing it. °Empty the large collection bag every 8 hours. Empty the small collection bag when it is about ? full. To empty your large or small collection bag, take the following steps: °· Always keep the bag below the level of the  catheter. This keeps urine from flowing backwards into the catheter. °· Hold the bag over the toilet or another container. Turn the valve (spigot) at the bottom of the bag to empty the urine. °? Do not touch the opening of the spigot. °? Do not let the opening touch the toilet or container. °· Close the spigot tightly when the bag is empty. °What are some general tips? ° °· Always wash your hands before and after caring for your catheter and collection bag. Use a mild, fragrance-free soap. If soap and water are not available, use hand sanitizer. °· Clean the catheter with soap and water as often as told by your health care provider. °· Always make sure there are no twists or curls (kinks) in the catheter tube. °· Always make sure there are no leaks in the catheter or collection bag. °· Drink enough fluid to keep your urine clear or pale yellow. °· Do not take baths, swim, or use a hot tub. °When should I seek medical care? °Seek medical care if: °· You leak urine. °· You have redness, swelling, or pain around your catheter opening. °· You have fluid or blood coming from your catheter opening. °· Your catheter opening feels warm to the touch. °· You have pus or a bad smell coming from your catheter opening. °· You have a fever or chills. °· Your urine flow slows down. °· Your urine becomes cloudy or smelly. °When should I seek immediate medical care? °Seek immediate medical care if your catheter comes out, or if you have: °· Nausea. °· Back pain. °· Difficulty changing your catheter. °· Blood in your urine. °· No urine flow for 1 hour. °This information is not intended to replace advice given to you by your health care provider. Make sure you discuss any questions you have with your health care provider. °Document Released: 09/16/2010 Document Revised: 08/28/2015 Document Reviewed: 09/11/2014 °Elsevier Interactive Patient Education © 2019 [employer] °Moderate Conscious Sedation, Adult, Care After °These  instructions provide you with information about caring for yourself after your procedure. Your health care provider may also give you more specific instructions. Your treatment has been planned according to current medical practices, but problems sometimes occur. Call your health care provider if you have any problems or questions after your procedure. °What can I expect after the procedure? °After your procedure, it is common: °· To feel sleepy for several hours. °· To feel clumsy and have poor balance for several hours. °· To have poor judgment for several hours. °· To vomit if you eat too soon. °Follow these instructions at home: °For at least 24 hours after the procedure: ° °· Do not: °? Participate in activities where you could fall or become injured. °? Drive. °? Use heavy machinery. °? Drink alcohol. °? Take   sleeping pills or medicines that cause drowsiness. °? Make important decisions or sign legal documents. °? Take care of children on your own. °· Rest. °Eating and drinking °· Follow the diet recommended by your health care provider. °· If you vomit: °? Drink water, juice, or soup when you can drink without vomiting. °? Make sure you have little or no nausea before eating solid foods. °General instructions °· Have a responsible adult stay with you until you are awake and alert. °· Take over-the-counter and prescription medicines only as told by your health care provider. °· If you smoke, do not smoke without supervision. °· Keep all follow-up visits as told by your health care provider. This is important. °Contact a health care provider if: °· You keep feeling nauseous or you keep vomiting. °· You feel light-headed. °· You develop a rash. °· You have a fever. °Get help right away if: °· You have trouble breathing. °This information is not intended to replace advice given to you by your health care provider. Make sure you discuss any questions you have with your health care provider. °Document Released:  10/19/2012 Document Revised: 06/03/2015 Document Reviewed: 04/20/2015 °Elsevier Interactive Patient Education © 2019 [employer]. ° °

## 2018-07-08 NOTE — H&P (Signed)
Chief Complaint: Urinary retention  Referring Physician(s): Bell,Eugene D III  Supervising Physician: Daryll Brod  Patient Status: [facility] - Out-pt  History of Present Illness: Paul Bradley is a 73 y.o. male with urinary retention secondary to BPH.  He currently has a foley in place.  He is here today for placement of a suprapubic catheter.  He is NPO. No blood thinners. No nausea/vomiting. No Fever/chills. ROS negative.   Past Medical History:  Diagnosis Date  . Abscess of left hip    "healing wound" per [facility] Heme/Onc MD note 06/03/18  . Assistance needed for mobility    walker  . Atrial fibrillation ([place])   . BPH (benign prostatic hyperplasia)   . Cancer ([place])    thyroid  . Chronic anticoagulation   . Coronary artery disease   . Diabetes mellitus without complication ([place])   . Hematochezia 05/05/2018   colonoscopy at [facility] with diverticulosis   . Hypertension   . Thyroid disease     Past Surgical History:  Procedure Laterality Date  . THYROIDECTOMY      Allergies: Penicillins  Medications: Prior to Admission medications   Medication Sig Start Date End Date Taking? Authorizing Provider  apixaban (ELIQUIS) 5 MG TABS tablet Take 5 mg by mouth 2 (two) times daily.   Yes [provider]  Ascorbic Acid (VITAMIN C) 1000 MG tablet Take 1,000 mg by mouth daily.   Yes [provider]  bacitracin 500 UNIT/GM ointment Apply 1 application topically 2 (two) times daily.   Yes [provider]  calcitRIOL (ROCALTROL) 0.5 MCG capsule Take 0.5 mcg by mouth 2 (two) times daily.   Yes [provider]  cholecalciferol (VITAMIN D3) 25 MCG (1000 UT) tablet Take 1,000 Units by mouth daily.   Yes [provider]  levothyroxine (SYNTHROID, LEVOTHROID) 150 MCG tablet Take 150 mcg by mouth daily before breakfast.   Yes [provider]  lisinopril (ZESTRIL) 5 MG tablet Take 10 mg by mouth daily.    Yes [provider]  metoprolol succinate (TOPROL-XL) 25 MG 24 hr tablet Take 25 mg by mouth daily.   Yes [provider]  nystatin (NYSTATIN) powder Apply 1 g topically 4 (four) times daily.   Yes [provider]  ondansetron (ZOFRAN-ODT) 8 MG disintegrating tablet  04/21/18  Yes [provider]  senna-docusate (SENNA-PLUS) 8.6-50 MG tablet Take 1 tablet by mouth daily.   Yes [provider]  tamsulosin (FLOMAX) 0.4 MG CAPS capsule Take 1 capsule (0.4 mg total) by mouth daily after supper. 06/09/18  Yes Emokpae, Courage, MD  calcium carbonate (TUMS - DOSED IN MG ELEMENTAL CALCIUM) 500 MG chewable tablet Chew 1 tablet by mouth 5 (five) times daily as needed for indigestion or heartburn.    [provider]     Family History  Problem Relation Age of Onset  . Prostate cancer Father   . Colon cancer Neg Hx   . Colon polyps Neg Hx     Social History   Socioeconomic History  . Marital status: Single    Spouse name: Not on file  . Number of children: Not on file  . Years of education: Not on file  . Highest education level: Not on file  Occupational History  . Not on file  Social Needs  . Financial resource strain: Not on file  . Food insecurity    Worry: Not on file    Inability: Not on file  . Transportation  needs    Medical: Not on file    Non-medical: Not on file  Tobacco Use  . Smoking status: Never Smoker  . Smokeless tobacco: Never Used  Substance and Sexual Activity  . Alcohol use: Never    Frequency: Never  . Drug use: Never  . Sexual activity: Not on file  Lifestyle  . Physical activity    Days per week: Not on file    Minutes per session: Not on file  . Stress: Not on file  Relationships  . Social Herbalist on phone: Not on file    Gets together: Not on file    Attends religious service: Not on file    Active member of club or organization: Not on file    Attends meetings of clubs or organizations: Not on file     Relationship status: Not on file  Other Topics Concern  . Not on file  Social History Narrative   SISTER HELPS HIM CARE FOR HIMSELF. NO KIDS. WORKED AT [employer] TIME UNTIL LAST [place] 2020.     Review of Systems: A 12 point ROS discussed and pertinent positives are indicated in the HPI above.  All other systems are negative.  Review of Systems  Vital Signs: BP (!) 141/90 (BP Location: Right Arm)   Pulse 76   Temp 97.8 F (36.6 C) (Oral)   Resp 18   Wt 76.7 kg   SpO2 99%   BMI 22.92 kg/m   Physical Exam Vitals signs reviewed.  Constitutional:      Appearance: Normal appearance.  HENT:     Head: Normocephalic and atraumatic.  Eyes:     Extraocular Movements: Extraocular movements intact.  Neck:     Musculoskeletal: Normal range of motion.  Cardiovascular:     Rate and Rhythm: Normal rate and regular rhythm.  Pulmonary:     Effort: Pulmonary effort is normal.     Breath sounds: Normal breath sounds.  Abdominal:     General: There is no distension.     Palpations: Abdomen is soft.  Genitourinary:    Comments: Foley catheter in place. I clamped it with a hemostat so his bladder can fill. Musculoskeletal:        General: Swelling present.  Skin:    General: Skin is warm and dry.  Neurological:     General: No focal deficit present.     Mental Status: He is alert and oriented to person, place, and time.  Psychiatric:        Mood and Affect: Mood normal.        Behavior: Behavior normal.        Thought Content: Thought content normal.        Judgment: Judgment normal.     Imaging: No results found.  Labs:  CBC: Recent Labs    06/06/18 0627 06/07/18 0759 06/09/18 0459 07/08/18 0754  WBC 8.2 7.7 6.6 9.5  HGB 11.9* 11.0* 11.0* 12.0*  HCT 37.2* 33.1* 33.7* 36.5*  PLT 231 216 240 176    COAGS: Recent Labs    06/05/18 1850 07/08/18 0754  INR 1.2 1.1  APTT  --  33    BMP: Recent Labs    06/05/18 1850 06/06/18 0627 06/07/18 0759 06/09/18  0459  NA 130* 137 137 138  K 3.0* 3.1* 3.4* 3.2*  CL 91* 99 103 100  CO2 29 27 26 27   GLUCOSE 113* 91 99 100*  BUN 30* 25* 18 13  CALCIUM 10.7* 10.1 8.7* 8.0*  CREATININE 1.26* 1.03 0.91 0.99  GFRNONAA 56* >60 >60 >60  GFRAA >60 >60 >60 >60    LIVER FUNCTION TESTS: Recent Labs    06/04/18 0900 06/05/18 1850 06/06/18 0627  BILITOT 0.8 1.0 0.8  AST 40 37 37  ALT 19 18 18   ALKPHOS 43 40 39  PROT 7.6 6.5 6.2*  ALBUMIN 3.3* 3.0* 2.7*    TUMOR MARKERS: No results for input(s): AFPTM, CEA, CA199, CHROMGRNA in the last 8760 hours.  Assessment and Plan:  Urinary retention.  Will proceed with placement of a suprapubic catheter today by Dr. Annamaria Boots.  Risks and benefits discussed with the patient including bleeding, infection, damage to adjacent structures, bowel perforation/fistula connection, and sepsis.  All of the patient's questions were answered, patient is agreeable to proceed. Consent signed and in chart.  Thank you for this interesting consult.  I greatly enjoyed meeting ELGAR SCOGGINS and look forward to participating in their care.  A copy of this report was sent to the requesting provider on this date.  Electronically Signed: Murrell Redden, PA-C   07/08/2018, 9:23 AM      I spent a total of  30 Minutes  in face to face in clinical consultation, greater than 50% of which was counseling/coordinating care for suprapubic cath.

## 2018-07-28 NOTE — Addendum Note (Signed)
Encounter addended by: Abelino Derrick, RN on: 07/28/2018 11:30 AM  Actions taken: Charge Capture section accepted

## 2018-07-28 NOTE — Addendum Note (Signed)
Encounter addended by: Abelino Derrick, RN on: 07/28/2018 11:28 AM  Actions taken: Charge Capture section accepted

## 2019-04-02 ENCOUNTER — Emergency Department (HOSPITAL_COMMUNITY)
Admission: EM | Admit: 2019-04-02 | Discharge: 2019-04-02 | Disposition: A | Discharge status: Home / Self Care | Payer: Medicare HMO | Attending: Emergency Medicine | Admitting: Emergency Medicine

## 2019-04-02 ENCOUNTER — Other Ambulatory Visit: Payer: Self-pay

## 2019-04-02 DIAGNOSIS — I2510 Atherosclerotic heart disease of native coronary artery without angina pectoris: Secondary | ICD-10-CM | POA: Diagnosis not present

## 2019-04-02 DIAGNOSIS — T839XXA Unspecified complication of genitourinary prosthetic device, implant and graft, initial encounter: Secondary | ICD-10-CM

## 2019-04-02 DIAGNOSIS — E119 Type 2 diabetes mellitus without complications: Secondary | ICD-10-CM | POA: Insufficient documentation

## 2019-04-02 DIAGNOSIS — Z79899 Other long term (current) drug therapy: Secondary | ICD-10-CM | POA: Diagnosis not present

## 2019-04-02 DIAGNOSIS — Y731 Therapeutic (nonsurgical) and rehabilitative gastroenterology and urology devices associated with adverse incidents: Secondary | ICD-10-CM | POA: Diagnosis not present

## 2019-04-02 DIAGNOSIS — N179 Acute kidney failure, unspecified: Secondary | ICD-10-CM | POA: Insufficient documentation

## 2019-04-02 DIAGNOSIS — Z7901 Long term (current) use of anticoagulants: Secondary | ICD-10-CM | POA: Insufficient documentation

## 2019-04-02 DIAGNOSIS — T83098A Other mechanical complication of other indwelling urethral catheter, initial encounter: Secondary | ICD-10-CM | POA: Diagnosis present

## 2019-04-02 DIAGNOSIS — R404 Transient alteration of awareness: Secondary | ICD-10-CM

## 2019-04-02 DIAGNOSIS — I10 Essential (primary) hypertension: Secondary | ICD-10-CM | POA: Insufficient documentation

## 2019-04-02 DIAGNOSIS — R339 Retention of urine, unspecified: Secondary | ICD-10-CM | POA: Diagnosis not present

## 2019-04-02 DIAGNOSIS — Z85850 Personal history of malignant neoplasm of thyroid: Secondary | ICD-10-CM | POA: Diagnosis not present

## 2019-04-02 LAB — URINALYSIS, ROUTINE W REFLEX MICROSCOPIC
Bilirubin Urine: NEGATIVE
Glucose, UA: NEGATIVE mg/dL
Ketones, ur: NEGATIVE mg/dL
Nitrite: NEGATIVE
Protein, ur: 100 mg/dL — AB
RBC / HPF: >50 RBC/hpf — ABNORMAL HIGH (ref 0–5)
Specific Gravity, Urine: 1.011 (ref 1.005–1.030)
pH: 6 (ref 5.0–8.0)

## 2019-04-02 LAB — CBC WITH DIFFERENTIAL/PLATELET
Abs Immature Granulocytes: 0.04 10*3/uL (ref 0.00–0.07)
Basophils Absolute: 0 10*3/uL (ref 0.0–0.1)
Basophils Relative: 0 %
Eosinophils Absolute: 0 10*3/uL (ref 0.0–0.5)
Eosinophils Relative: 0 %
HCT: 47.4 % (ref 39.0–52.0)
Hemoglobin: 15.4 g/dL (ref 13.0–17.0)
Immature Granulocytes: 0 %
Lymphocytes Relative: 13 %
Lymphs Abs: 1.6 10*3/uL (ref 0.7–4.0)
MCH: 31 pg (ref 26.0–34.0)
MCHC: 32.5 g/dL (ref 30.0–36.0)
MCV: 95.4 fL (ref 80.0–100.0)
Monocytes Absolute: 1.3 10*3/uL — ABNORMAL HIGH (ref 0.1–1.0)
Monocytes Relative: 11 %
Neutro Abs: 8.9 10*3/uL — ABNORMAL HIGH (ref 1.7–7.7)
Neutrophils Relative %: 76 %
Platelets: 209 10*3/uL (ref 150–400)
RBC: 4.97 MIL/uL (ref 4.22–5.81)
RDW: 13.4 % (ref 11.5–15.5)
WBC: 11.9 10*3/uL — ABNORMAL HIGH (ref 4.0–10.5)
nRBC: 0 % (ref 0.0–0.2)

## 2019-04-02 LAB — BASIC METABOLIC PANEL
Anion gap: 14 (ref 5–15)
BUN: 31 mg/dL — ABNORMAL HIGH (ref 8–23)
CO2: 23 mmol/L (ref 22–32)
Calcium: 8.8 mg/dL — ABNORMAL LOW (ref 8.9–10.3)
Chloride: 95 mmol/L — ABNORMAL LOW (ref 98–111)
Creatinine, Ser: 1.38 mg/dL — ABNORMAL HIGH (ref 0.61–1.24)
GFR calc Af Amer: 58 mL/min — ABNORMAL LOW (ref >60)
GFR calc non Af Amer: 50 mL/min — ABNORMAL LOW (ref >60)
Glucose, Bld: 151 mg/dL — ABNORMAL HIGH (ref 70–99)
Potassium: 3.7 mmol/L (ref 3.5–5.1)
Sodium: 132 mmol/L — ABNORMAL LOW (ref 135–145)

## 2019-04-02 MED ORDER — SODIUM CHLORIDE 0.9 % IV BOLUS
500.0000 mL | Freq: Once | INTRAVENOUS | Status: AC
  Administered 2019-04-02: 500 mL via INTRAVENOUS

## 2019-04-02 NOTE — ED Triage Notes (Signed)
Patient c/o urinary retention. Patient reported zero output on his catheter since last night at 9 pm. Patient denies fever.

## 2019-04-02 NOTE — ED Provider Notes (Signed)
[facility] DEPT Provider Note   CSN: [id number] Arrival date & time: 04/02/19  0547     History Chief Complaint  Patient presents with  . Urinary Retention    Paul Bradley is a 74 y.o. male.  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  Patient reports his Foley catheter has not drained since 9 PM last night.  He reports pressure in his suprapubic region.  No fevers.  I was initially asked to respond to the resuscitation bay as he appeared to have alteration of consciousness while being triaged.  Staff reports that he was telling them his history when all of a sudden he seemed to become less aware.  He did not fully lose consciousness.  They noted that his blood pressure dropped.  On my evaluation, he is awake, will answer questions appropriately, does appear somewhat slow.  Reports that he recalls events of being brought back to the resuscitation bay.  Denies weakness, pain.  Denies chest pain or shortness of breath.  Upon standing, patient stated that he felt somewhat dizzy.  Denies room spinning dizziness.  Past Medical History:  Diagnosis Date  . Abscess of left hip    "healing wound" per [facility] Heme/Onc MD note 06/03/18  . Assistance needed for mobility    walker  . Atrial fibrillation ([place])   . BPH (benign prostatic hyperplasia)   . Cancer ([place])    thyroid  . Chronic anticoagulation   . Coronary artery disease   . Diabetes mellitus without complication ([place])   . Hematochezia 05/05/2018   colonoscopy at [facility] with diverticulosis   . Hypertension   . Thyroid disease     Patient Active Problem List   Diagnosis Date Noted  . GI bleed 06/08/2018  . Diverticulosis 06/08/2018  . Acute blood loss anemia---  Gi Bleed 06/08/2018  . Chronic a-fib on  Eliquis 06/08/2018  . Anticoagulant long-term use 06/08/2018  . Diarrhea   . Generalized weakness 06/05/2018    Past Surgical History:  Procedure Laterality Date  . THYROIDECTOMY         Family History  Problem Relation Age of Onset  . Prostate cancer Father   . Colon cancer Neg Hx   . Colon polyps Neg Hx     Social History   Tobacco Use  . Smoking status: Never Smoker  . Smokeless tobacco: Never Used  Substance Use Topics  . Alcohol use: Never  . Drug use: Never    Home Medications Prior to Admission medications   Medication Sig Start Date End Date Taking? Authorizing Provider  apixaban (ELIQUIS) 5 MG TABS tablet Take 5 mg by mouth 2 (two) times daily.    [provider]  Ascorbic Acid (VITAMIN C) 1000 MG tablet Take 1,000 mg by mouth daily.    [provider]  bacitracin 500 UNIT/GM ointment Apply 1 application topically 2 (two) times daily.    [provider]  calcitRIOL (ROCALTROL) 0.5 MCG capsule Take 0.5 mcg by mouth 2 (two) times daily.    [provider]  calcium carbonate (TUMS - DOSED IN MG ELEMENTAL CALCIUM) 500 MG chewable tablet Chew 1 tablet by mouth 5 (five) times daily as needed for indigestion or heartburn.    [provider]  cholecalciferol (VITAMIN D3) 25 MCG (  1000 UT) tablet Take 1,000 Units by mouth daily.    [provider]  levothyroxine (SYNTHROID, LEVOTHROID) 150 MCG tablet Take 150 mcg by mouth daily before breakfast.    [provider]  lisinopril (ZESTRIL) 5 MG tablet Take 10 mg by mouth daily.     [provider]  metoprolol succinate (TOPROL-XL) 25 MG 24 hr tablet Take 25 mg by mouth daily.    [provider]  nystatin (NYSTATIN) powder Apply 1 g topically 4 (four) times daily.    [provider]  ondansetron (ZOFRAN-ODT) 8 MG disintegrating tablet  04/21/18   [provider]  senna-docusate (SENNA-PLUS) 8.6-50 MG tablet Take 1 tablet by mouth  daily.    [provider]  tamsulosin (FLOMAX) 0.4 MG CAPS capsule Take 1 capsule (0.4 mg total) by mouth daily after supper. 06/09/18   Roxan Hockey, MD    Allergies    Penicillins  Review of Systems   Review of Systems  Constitutional: Negative for fever.  Respiratory: Negative for cough and shortness of breath.   Cardiovascular: Negative for chest pain.  Gastrointestinal: Negative for abdominal pain, diarrhea and vomiting.  Genitourinary: Positive for difficulty urinating.  Neurological: Positive for dizziness.  Psychiatric/Behavioral: Positive for confusion.  All other systems reviewed and are negative.   Physical Exam Updated Vital Signs BP 117/70   Pulse 94   Temp 98.2 F (36.8 C) (Oral)   Resp 18   SpO2 97%   Physical Exam Vitals and nursing note reviewed.  Constitutional:      Appearance: He is well-developed. He is not ill-appearing.  HENT:     Head: Normocephalic and atraumatic.     Nose: Nose normal.     Mouth/Throat:     Mouth: Mucous membranes are dry.  Eyes:     Pupils: Pupils are equal, round, and reactive to light.  Cardiovascular:     Rate and Rhythm: Normal rate. Rhythm irregular.     Heart sounds: Normal heart sounds. No murmur.  Pulmonary:     Effort: Pulmonary effort is normal. No respiratory distress.     Breath sounds: Normal breath sounds. No wheezing.  Abdominal:     General: Bowel sounds are normal.     Palpations: Abdomen is soft.     Tenderness: There is no abdominal tenderness. There is no rebound.     Comments: Suprapubic catheter in place with slight dried blood, no significant tenderness to palpation  Musculoskeletal:     Cervical back: Neck supple.     Right lower leg: No edema.     Left lower leg: No edema.  Lymphadenopathy:     Cervical: No cervical adenopathy.  Skin:    General: Skin is warm and dry.  Neurological:     Mental Status: He is alert and oriented to person, place, and time.     Comments: 5 out of 5  strength in all 4 extremities, no dysmetria to finger-nose-finger  Psychiatric:        Mood and Affect: Mood normal.     ED Results / Procedures / Treatments   Labs (all labs ordered are listed, but only abnormal results are displayed) Labs Reviewed  URINALYSIS, ROUTINE W REFLEX MICROSCOPIC - Abnormal; Notable for the following components:      Result Value   APPearance TURBID (*)    Hgb urine dipstick MODERATE (*)    Protein, ur 100 (*)    Leukocytes,Ua LARGE (*)    RBC / HPF >50 (*)  Bacteria, UA FEW (*)    All other components within normal limits  CBC WITH DIFFERENTIAL/PLATELET - Abnormal; Notable for the following components:   WBC 11.9 (*)    Neutro Abs 8.9 (*)    Monocytes Absolute 1.3 (*)    All other components within normal limits  BASIC METABOLIC PANEL - Abnormal; Notable for the following components:   Sodium 132 (*)    Chloride 95 (*)    Glucose, Bld 151 (*)    BUN 31 (*)    Creatinine, Ser 1.38 (*)    Calcium 8.8 (*)    GFR calc non Af Amer 50 (*)    GFR calc Af Amer 58 (*)    All other components within normal limits    EKG EKG Interpretation  Date/Time:  Sunday April 02 2019 06:14:54 EDT Ventricular Rate:  97 PR Interval:    QRS Duration: 90 QT Interval:  378 QTC Calculation: 481 R Axis:   57 Text Interpretation: Normal sinus rhythm Ventricular premature complex Borderline prolonged QT interval Confirmed by Thayer Jew [phone]) on 04/02/2019 7:23:36 AM   Radiology No results found.  Procedures Procedures (including critical care time)  Medications Ordered in ED Medications  sodium chloride 0.9 % bolus 500 mL (has no administration in time range)    ED Course  I have reviewed the triage vital signs and the nursing notes.  Pertinent labs & imaging results that were available during my care of the patient were reviewed by me and considered in my medical decision making (see chart for details).    MDM Rules/Calculators/A&P                        Patient presents with problems with his suprapubic Foley not draining.  An episode of triage with alteration of consciousness.  On my evaluation he is awake and alert.  Doubt syncope or seizure.  Suspect he may have had a vasovagal episode.  He is awake and alert on my evaluation and his work-up is reassuring.  EKG without ischemic changes or arrhythmic changes.  He does have evidence of slight AKI and slight leukocytosis.  He is afebrile.  Urinalysis does appear turbid and with significant white cells and white cell clumps.  However, this is in the setting of a suprapubic catheter.  We will culture and defer treatment at this time.  Patient was given 500 cc of fluid.  If ambulatory at baseline, will discharge home.  Final Clinical Impression(s) / ED Diagnoses Final diagnoses:  Foley catheter problem, initial encounter [facility])  Urinary retention  AKI (acute kidney injury) ([place])  Altered level of consciousness    Rx / DC Orders ED Discharge Orders    None       Francesa Eugenio, Barbette Hair, MD 04/02/19 [phone]

## 2019-04-02 NOTE — ED Provider Notes (Signed)
Patient signed to me by Dr. Dina Rich pending IV fluids.  Patient examined and he feels well enough to go home.  He is alert and oriented x4.  Has no complaints.   Lacretia Leigh, MD 04/02/19 [phone]

## 2019-04-04 LAB — URINE CULTURE: Culture: >=100000 — AB

## 2019-04-05 ENCOUNTER — Telehealth: Payer: Self-pay | Admitting: Emergency Medicine

## 2019-04-05 NOTE — Telephone Encounter (Signed)
Post ED Visit - Positive Culture Follow-up: Successful Patient Follow-Up  Culture assessed and recommendations reviewed by:  []  Elenor Quinones, Pharm.D. []  Heide Guile, Pharm.D., BCPS AQ-ID []  Parks Neptune, Pharm.D., BCPS []  Alycia Rossetti, Pharm.D., BCPS []  Rodriguez Camp, Pharm.D., BCPS, AAHIVP []  Legrand Como, Pharm.D., BCPS, AAHIVP []  Salome Arnt, PharmD, BCPS []  Johnnette Gourd, PharmD, BCPS []  Hughes Better, PharmD, BCPS []  Leeroy Cha, PharmD Samul Dada PharmD  Positive urine culture  []  Patient discharged without antimicrobial prescription and treatment is now indicated []  Organism is resistant to prescribed ED discharge antimicrobial []  Patient with positive blood cultures  Changes discussed with ED provider: Benedetto Goad PA New antibiotic prescription symptom check, if + symptoms treat with nitrofurantoin 100mg  po bid x 7 days Is asymptomatic, no antibiotics needed and followup with urology Denies symptoms , no new rx called or needed, will followup with urologist on Wednesday 04/12/2019     Hazle Nordmann 04/05/2019, 1:13 PM

## 2019-04-05 NOTE — Progress Notes (Signed)
ED Antimicrobial Stewardship Positive Culture Follow Up   Paul Bradley is an 74 y.o. male who presented to [facility] on 04/02/2019 with a chief complaint of  Chief Complaint  Patient presents with  . Urinary Retention    Recent Results (from the past 720 hour(s))  Urine culture     Status: Abnormal   Collection Time: 04/02/19  8:20 AM   Specimen: Urine, Random  Result Value Ref Range Status   Specimen Description   Final    URINE, RANDOM Performed at [place] [address], [place], [place] [address]    Special Requests   Final    NONE Performed at [facility], [place] [address], [place], [place] [address]    Culture >=100,000 COLONIES/mL KLEBSIELLA OXYTOCA (A)  Final   Report Status 04/04/2019 FINAL  Final   Organism ID, Bacteria KLEBSIELLA OXYTOCA (A)  Final      Susceptibility   Klebsiella oxytoca - MIC*    AMPICILLIN >=32 RESISTANT Resistant     CEFAZOLIN 16 SENSITIVE Sensitive     CEFTRIAXONE <=0.25 SENSITIVE Sensitive     CIPROFLOXACIN <=0.25 SENSITIVE Sensitive     GENTAMICIN <=1 SENSITIVE Sensitive     IMIPENEM <=0.25 SENSITIVE Sensitive     NITROFURANTOIN 32 SENSITIVE Sensitive     TRIMETH/SULFA <=20 SENSITIVE Sensitive     AMPICILLIN/SULBACTAM 16 INTERMEDIATE Intermediate     PIP/TAZO <=4 SENSITIVE Sensitive     * >=100,000 COLONIES/mL KLEBSIELLA OXYTOCA    [x]  Patient discharged originally without antimicrobial agent and treatment may now be indicated  Plan: RN to call patient and assess for symptoms of UTI (fever, flank pain, suprapubic pain). If symptomatic, treat with below prescription. If asymptomatic, no antibiotics needed, recommend follow up with urologist outpatient.  New antibiotic prescription: Nitrofurantoin 100 mg PO BID x7 days, no refills.  ED Provider: Benedetto Goad, PA-C  Lenis Noon, PharmD 04/05/2019, 11:29 AM Clinical Pharmacist [phone]

## 2019-05-28 ENCOUNTER — Encounter (HOSPITAL_COMMUNITY): Payer: Self-pay

## 2019-05-28 ENCOUNTER — Other Ambulatory Visit: Payer: Self-pay

## 2019-05-28 ENCOUNTER — Emergency Department (HOSPITAL_COMMUNITY)
Admission: EM | Admit: 2019-05-28 | Discharge: 2019-05-28 | Disposition: A | Discharge status: Home / Self Care | Payer: Medicare HMO | Attending: Emergency Medicine | Admitting: Emergency Medicine

## 2019-05-28 DIAGNOSIS — Z79899 Other long term (current) drug therapy: Secondary | ICD-10-CM | POA: Diagnosis not present

## 2019-05-28 DIAGNOSIS — E119 Type 2 diabetes mellitus without complications: Secondary | ICD-10-CM | POA: Diagnosis not present

## 2019-05-28 DIAGNOSIS — Z9359 Other cystostomy status: Secondary | ICD-10-CM | POA: Diagnosis not present

## 2019-05-28 DIAGNOSIS — I10 Essential (primary) hypertension: Secondary | ICD-10-CM | POA: Insufficient documentation

## 2019-05-28 DIAGNOSIS — Z7901 Long term (current) use of anticoagulants: Secondary | ICD-10-CM | POA: Insufficient documentation

## 2019-05-28 DIAGNOSIS — I2510 Atherosclerotic heart disease of native coronary artery without angina pectoris: Secondary | ICD-10-CM | POA: Insufficient documentation

## 2019-05-28 DIAGNOSIS — Z85850 Personal history of malignant neoplasm of thyroid: Secondary | ICD-10-CM | POA: Insufficient documentation

## 2019-05-28 DIAGNOSIS — R339 Retention of urine, unspecified: Secondary | ICD-10-CM

## 2019-05-28 LAB — URINALYSIS, ROUTINE W REFLEX MICROSCOPIC
Bilirubin Urine: NEGATIVE
Glucose, UA: NEGATIVE mg/dL
Ketones, ur: NEGATIVE mg/dL
Nitrite: NEGATIVE
Protein, ur: 100 mg/dL — AB
Specific Gravity, Urine: 1.014 (ref 1.005–1.030)
WBC, UA: >50 WBC/hpf — ABNORMAL HIGH (ref 0–5)
pH: 6 (ref 5.0–8.0)

## 2019-05-28 MED ORDER — NITROFURANTOIN MONOHYD MACRO 100 MG PO CAPS
100.0000 mg | ORAL_CAPSULE | Freq: Once | ORAL | Status: AC
  Administered 2019-05-28: 100 mg via ORAL
  Filled 2019-05-28: qty 1

## 2019-05-28 MED ORDER — NITROFURANTOIN MONOHYD MACRO 100 MG PO CAPS: 100.0000 mg | ORAL_CAPSULE | Freq: Two times a day (BID) | ORAL | 0 refills | Status: DC

## 2019-05-28 MED ORDER — SULFAMETHOXAZOLE-TRIMETHOPRIM 800-160 MG PO TABS: 1.0000 | ORAL_TABLET | Freq: Once | ORAL | Status: DC

## 2019-05-28 NOTE — ED Notes (Signed)
Discharge and follow up instructions reviewed with sister. Patient assisted into sisters car.

## 2019-05-28 NOTE — ED Triage Notes (Signed)
Pt reports urinary retention and is hypotensive. Pt is weak and pale.

## 2019-05-28 NOTE — ED Notes (Signed)
Pt lying in bed. Suprapubic cath placed. Pt has not other complaints. Will continue to monitor.

## 2019-05-28 NOTE — ED Notes (Signed)
Attempted to call sister X3 no answer.

## 2019-05-28 NOTE — ED Triage Notes (Signed)
Pt states he last urinated at 845 pm but was unable to urinate this morning. Pt has urologist and needs cath change every month which is due next week.

## 2019-05-28 NOTE — ED Provider Notes (Signed)
[facility] DEPT Provider Note   CSN: [id number] Arrival date & time: 05/28/19  0547     History Chief Complaint  Patient presents with  . Urinary Retention    Paul Bradley is a 74 y.o. male.  Patient with indwelling suprapubic catheter that became clogged overnight he usually gets it changed every month and is due to change here soon.  He has no back pain.  No fevers.  No other associated symptoms.  Presents here for further evaluation        Past Medical History:  Diagnosis Date  . Abscess of left hip    "healing wound" per [facility] Heme/Onc MD note 06/03/18  . Assistance needed for mobility    walker  . Atrial fibrillation ([place])   . BPH (benign prostatic hyperplasia)   . Cancer ([place])    thyroid  . Chronic anticoagulation   . Coronary artery disease   . Diabetes mellitus without complication ([place])   . Hematochezia 05/05/2018   colonoscopy at [facility] with diverticulosis   . Hypertension   . Thyroid disease     Patient Active Problem List   Diagnosis Date Noted  . GI bleed 06/08/2018  . Diverticulosis 06/08/2018  . Acute blood loss anemia---  Gi Bleed 06/08/2018  . Chronic a-fib on Eliquis 06/08/2018  . Anticoagulant long-term use 06/08/2018  . Diarrhea   . Generalized weakness 06/05/2018    Past Surgical History:  Procedure Laterality Date  . THYROIDECTOMY         Family History  Problem Relation Age of Onset  . Prostate cancer Father   . Colon cancer Neg Hx   . Colon polyps Neg Hx     Social History   Tobacco Use  . Smoking status: Never Smoker  . Smokeless tobacco: Never Used  Substance Use Topics  . Alcohol use: Never  . Drug use: Never    Home Medications Prior to Admission medications   Medication Sig Start Date End Date Taking? Authorizing Provider  apixaban (ELIQUIS) 5 MG TABS tablet Take 5 mg by mouth 2 (two) times daily.    [provider]  Ascorbic Acid (VITAMIN C) 1000 MG tablet Take  1,000 mg by mouth daily.    [provider]  bacitracin 500 UNIT/GM ointment Apply 1 application topically 2 (two) times daily.    [provider]  calcitRIOL (ROCALTROL) 0.5 MCG capsule Take 0.5 mcg by mouth 2 (two) times daily.    [provider]  calcium carbonate (TUMS - DOSED IN MG ELEMENTAL CALCIUM) 500 MG chewable tablet Chew 1 tablet by mouth 5 (five) times daily as needed for indigestion or heartburn.    [provider]  cholecalciferol (VITAMIN D3) 25 MCG (1000 UT) tablet Take 1,000 Units by mouth daily.    [provider]  levothyroxine (SYNTHROID, LEVOTHROID) 150 MCG tablet Take 150 mcg by mouth daily before breakfast.    [provider]  lisinopril (ZESTRIL) 5 MG tablet Take 10 mg by mouth daily.     [provider]  metoprolol succinate (TOPROL-XL) 25 MG 24 hr tablet Take 25 mg by mouth daily.    [provider]  nitrofurantoin, macrocrystal-monohydrate, (MACROBID) 100 MG capsule Take 1 capsule (100 mg total) by mouth 2 (two) times daily. X 7 days 05/28/19   Matther Labell, Corene Cornea, MD  nystatin (NYSTATIN) powder Apply 1 g topically 4 (four) times daily.    [provider]  ondansetron (ZOFRAN-ODT) 8 MG disintegrating tablet  04/21/18   [provider]  senna-docusate (SENNA-PLUS) 8.6-50 MG tablet Take 1 tablet by mouth daily.    [provider]  tamsulosin (FLOMAX) 0.4 MG CAPS capsule Take 1 capsule (0.4 mg total) by mouth daily after supper. 06/09/18   Roxan Hockey, MD    Allergies    Penicillins  Review of Systems   Review of Systems  All other systems reviewed and are negative.   Physical Exam Updated Vital Signs BP 125/89   Pulse 84   Temp (!) 97.4 F (36.3 C)   Resp 14   SpO2 100%   Physical Exam Vitals and nursing note reviewed.  Constitutional:      Appearance: He is well-developed.  HENT:     Head: Normocephalic and atraumatic.     Nose: No congestion or rhinorrhea.      Mouth/Throat:     Mouth: Mucous membranes are dry.     Pharynx: Oropharynx is clear.  Eyes:     Conjunctiva/sclera: Conjunctivae normal.  Cardiovascular:     Rate and Rhythm: Normal rate.  Pulmonary:     Effort: Pulmonary effort is normal. No respiratory distress.  Abdominal:     General: There is no distension.  Musculoskeletal:        General: No tenderness. Normal range of motion.     Cervical back: Normal range of motion.  Skin:    General: Skin is warm and dry.     Coloration: Skin is not jaundiced or pale.  Neurological:     General: No focal deficit present.     Mental Status: He is alert.     ED Results / Procedures / Treatments   Labs (all labs ordered are listed, but only abnormal results are displayed) Labs Reviewed  URINALYSIS, ROUTINE W REFLEX MICROSCOPIC - Abnormal; Notable for the following components:      Result Value   APPearance TURBID (*)    Hgb urine dipstick SMALL (*)    Protein, ur 100 (*)    Leukocytes,Ua MODERATE (*)    WBC, UA >50 (*)    Bacteria, UA MANY (*)    All other components within normal limits  URINE CULTURE    EKG None  Radiology No results found.  Procedures Procedures (including critical care time)  Medications Ordered in ED Medications  nitrofurantoin (macrocrystal-monohydrate) (MACROBID) capsule 100 mg (100 mg Oral Given 05/28/19 0747)    ED Course  I have reviewed the triage vital signs and the nursing notes.  Pertinent labs & imaging results that were available during my care of the patient were reviewed by me and considered in my medical decision making (see chart for details).    MDM Rules/Calculators/A&P                      Suprapubic replaced by nursing. Purulent drainage afterwards. UA with likely infection. Culture sent. abx started based on previous culture.   Final Clinical Impression(s) / ED Diagnoses Final diagnoses:  Urinary retention  Suprapubic catheter ([place])    Rx / DC Orders ED Discharge  Orders         Ordered    nitrofurantoin, macrocrystal-monohydrate, (MACROBID) 100 MG capsule  2 times daily     05/28/19 0650           Aki Abalos, Corene Cornea, MD 05/28/19 [phone]

## 2019-05-30 LAB — URINE CULTURE: Culture: >=100000 — AB

## 2019-05-31 ENCOUNTER — Telehealth: Payer: Self-pay

## 2019-05-31 NOTE — Telephone Encounter (Signed)
Post ED Visit - Positive Culture Follow-up  Culture report reviewed by antimicrobial stewardship pharmacist: [place] Team []  Elenor Quinones, Pharm.D. []  Heide Guile, Pharm.D., BCPS AQ-ID []  Parks Neptune, Pharm.D., BCPS []  Alycia Rossetti, Pharm.D., BCPS []  [place], Pharm.D., BCPS, AAHIVP []  Legrand Como, Pharm.D., BCPS, AAHIVP []  Salome Arnt, PharmD, BCPS []  Johnnette Gourd, PharmD, BCPS []  Hughes Better, PharmD, BCPS []  Leeroy Cha, PharmD []  Laqueta Linden, PharmD, BCPS []  Albertina Parr, PharmD  [place] Team []  Leodis Sias, PharmD []  Lindell Spar, PharmD []  Royetta Asal, PharmD []  Graylin Shiver, Rph []  Rema Fendt) Glennon Mac, PharmD []  Arlyn Dunning, PharmD []  Netta Cedars, PharmD []  Dia Sitter, PharmD []  Leone Haven, PharmD []  Gretta Arab, PharmD []  Theodis Shove, PharmD []  Peggyann Juba, PharmD [x]  Reuel Boom, PharmD   Positive urine culture Treated with Nitrofuantoin, organism sensitive to the same and no further patient follow-up is required at this time.  Genia Del 05/31/2019, 11:10 AM

## 2019-07-08 ENCOUNTER — Emergency Department (HOSPITAL_COMMUNITY)
Admission: EM | Admit: 2019-07-08 | Discharge: 2019-07-08 | Disposition: A | Discharge status: Home / Self Care | Payer: Medicare HMO | Attending: Emergency Medicine | Admitting: Emergency Medicine

## 2019-07-08 ENCOUNTER — Encounter (HOSPITAL_COMMUNITY): Payer: Self-pay | Admitting: Emergency Medicine

## 2019-07-08 ENCOUNTER — Other Ambulatory Visit: Payer: Self-pay

## 2019-07-08 DIAGNOSIS — T83198A Other mechanical complication of other urinary devices and implants, initial encounter: Secondary | ICD-10-CM | POA: Diagnosis not present

## 2019-07-08 DIAGNOSIS — Z7901 Long term (current) use of anticoagulants: Secondary | ICD-10-CM | POA: Insufficient documentation

## 2019-07-08 DIAGNOSIS — E119 Type 2 diabetes mellitus without complications: Secondary | ICD-10-CM | POA: Insufficient documentation

## 2019-07-08 DIAGNOSIS — I10 Essential (primary) hypertension: Secondary | ICD-10-CM | POA: Insufficient documentation

## 2019-07-08 DIAGNOSIS — Z79899 Other long term (current) drug therapy: Secondary | ICD-10-CM | POA: Insufficient documentation

## 2019-07-08 DIAGNOSIS — N3000 Acute cystitis without hematuria: Secondary | ICD-10-CM | POA: Diagnosis not present

## 2019-07-08 DIAGNOSIS — Y829 Unspecified medical devices associated with adverse incidents: Secondary | ICD-10-CM | POA: Insufficient documentation

## 2019-07-08 DIAGNOSIS — T83010A Breakdown (mechanical) of cystostomy catheter, initial encounter: Secondary | ICD-10-CM

## 2019-07-08 LAB — URINALYSIS, ROUTINE W REFLEX MICROSCOPIC
Bilirubin Urine: NEGATIVE
Glucose, UA: NEGATIVE mg/dL
Ketones, ur: NEGATIVE mg/dL
Nitrite: NEGATIVE
Protein, ur: 100 mg/dL — AB
Specific Gravity, Urine: 1.013 (ref 1.005–1.030)
WBC, UA: >50 WBC/hpf — ABNORMAL HIGH (ref 0–5)
pH: 6 (ref 5.0–8.0)

## 2019-07-08 LAB — CBG MONITORING, ED: Glucose-Capillary: 127 mg/dL — ABNORMAL HIGH (ref 70–99)

## 2019-07-08 MED ORDER — CIPROFLOXACIN HCL 250 MG PO TABS
500.0000 mg | ORAL_TABLET | Freq: Once | ORAL | Status: AC
  Administered 2019-07-08: 500 mg via ORAL
  Filled 2019-07-08: qty 2

## 2019-07-08 MED ORDER — CIPROFLOXACIN HCL 500 MG PO TABS
500.0000 mg | ORAL_TABLET | Freq: Two times a day (BID) | ORAL | 0 refills | Status: DC
Start: 2019-07-08 — End: 2021-02-11

## 2019-07-08 NOTE — ED Notes (Signed)
Call to [facility] for a 22 F catheter

## 2019-07-08 NOTE — Discharge Instructions (Signed)
Urine looks very purulent.  Little bit more than expected from a suprapubic catheter.  Take the antibiotic Cipro as directed.  Call urology first part of the week they can follow-up on the urine culture.  To make sure he still need the antibiotic or its the correct antibiotic.  Return for any new or worse symptoms.

## 2019-07-08 NOTE — ED Notes (Signed)
Pt  Has a super pubic catheter in place   Is clogged   Here for eval

## 2019-07-08 NOTE — ED Triage Notes (Signed)
Patient c/o problems with suprapubic cath. Per sister patient unable to flush suprapubic cath and no urine draining. Patient c/o abd pressure. Denies any fevers, nausea, vomiting, or diarrhea. 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

## 2019-07-08 NOTE — ED Notes (Signed)
Bladder scan performed. 214 ml urine noted in bladder. RN notified

## 2019-07-08 NOTE — ED Provider Notes (Signed)
[facility] EMERGENCY DEPARTMENT Provider Note   CSN: [id number] Arrival date & time: 07/08/19  3154     History Chief Complaint  Patient presents with  . Abdominal Pain    Paul Bradley is a 74 y.o. male.  Patient with a history of longstanding suprapubic catheter. Due to bladder not working properly. [facility] by [facility]. Requires to have the catheter changed frequently. It was just done June 7. Is followed by Dr. Gloriann Loan. Due to see them again early July. But patient noted that at 12 noon the bag stopped collecting fluid and he started to feel discomfort as if his abdomen bladder was getting distended. Otherwise the patient without any [place] complaints. Here is not febrile had a little bit of tachycardia initially with a heart rate of 102 blood pressure 166. Oxygen levels on room air 96%.        Past Medical History:  Diagnosis Date  . Abscess of left hip    "healing wound" per [facility] Heme/Onc MD note 06/03/18  . Assistance needed for mobility    walker  . Atrial fibrillation ([place])   . BPH (benign prostatic hyperplasia)   . Cancer ([place])    thyroid  . Chronic anticoagulation   . Coronary artery disease   . Diabetes mellitus without complication ([place])   . Hematochezia 05/05/2018   colonoscopy at [facility] with diverticulosis   . Hypertension   . Thyroid disease     Patient Active Problem List   Diagnosis Date Noted  . GI bleed 06/08/2018  . Diverticulosis 06/08/2018  . Acute blood loss anemia---  Gi Bleed 06/08/2018  . Chronic a-fib on Eliquis 06/08/2018  . Anticoagulant long-term use 06/08/2018  . Diarrhea   . Generalized weakness 06/05/2018    Past Surgical History:  Procedure Laterality Date  . THYROIDECTOMY         Family History  Problem Relation Age of Onset  . Prostate cancer Father   . Colon cancer Neg Hx   . Colon polyps Neg Hx     Social History   Tobacco Use  . Smoking status: Never Smoker  . Smokeless tobacco: Never Used  Vaping  Use  . Vaping Use: Never used  Substance Use Topics  . Alcohol use: Never  . Drug use: Never    Home Medications Prior to Admission medications   Medication Sig Start Date End Date Taking? Authorizing Provider  apixaban (ELIQUIS) 5 MG TABS tablet Take 5 mg by mouth 2 (two) times daily.    [provider]  Ascorbic Acid (VITAMIN C) 1000 MG tablet Take 1,000 mg by mouth daily.    [provider]  bacitracin 500 UNIT/GM ointment Apply 1 application topically 2 (two) times daily.    [provider]  calcitRIOL (ROCALTROL) 0.5 MCG capsule Take 0.5 mcg by mouth 2 (two) times daily.    [provider]  calcium carbonate (TUMS - DOSED IN MG ELEMENTAL CALCIUM) 500 MG chewable tablet Chew 1 tablet by mouth 5 (five) times daily as needed for indigestion or heartburn.    [provider]  cholecalciferol (VITAMIN D3) 25 MCG (1000 UT) tablet Take 1,000 Units by mouth daily.    [provider]  ciprofloxacin (CIPRO) 500 MG tablet Take 1 tablet (500 mg total) by mouth 2 (two) times daily. 07/08/19   Fredia Sorrow, MD  levothyroxine (SYNTHROID, LEVOTHROID) 150 MCG tablet Take 150 mcg by mouth daily before breakfast.    [provider]  lisinopril (ZESTRIL)  5 MG tablet Take 10 mg by mouth daily.     [provider]  metoprolol succinate (TOPROL-XL) 25 MG 24 hr tablet Take 25 mg by mouth daily.    [provider]  nitrofurantoin, macrocrystal-monohydrate, (MACROBID) 100 MG capsule Take 1 capsule (100 mg total) by mouth 2 (two) times daily. X 7 days 05/28/19   Mesner, Corene Cornea, MD  nystatin (NYSTATIN) powder Apply 1 g topically 4 (four) times daily.    [provider]  ondansetron (ZOFRAN-ODT) 8 MG disintegrating tablet  04/21/18   [provider]  senna-docusate (SENNA-PLUS) 8.6-50 MG tablet Take 1 tablet by mouth daily.    [provider]  tamsulosin (FLOMAX) 0.4 MG CAPS capsule Take 1 capsule (0.4 mg total)  by mouth daily after supper. 06/09/18   Roxan Hockey, MD    Allergies    Penicillins  Review of Systems   Review of Systems  Constitutional: Negative for chills and fever.  HENT: Negative for congestion, rhinorrhea and sore throat.   Eyes: Negative for visual disturbance.  Respiratory: Negative for cough and shortness of breath.   Cardiovascular: Negative for chest pain and leg swelling.  Gastrointestinal: Negative for abdominal pain, diarrhea, nausea and vomiting.  Genitourinary: Positive for difficulty urinating. Negative for dysuria.  Musculoskeletal: Negative for back pain and neck pain.  Skin: Negative for rash.  Neurological: Negative for dizziness, light-headedness and headaches.  Hematological: Does not bruise/bleed easily.  Psychiatric/Behavioral: Negative for confusion.    Physical Exam Updated Vital Signs BP (!) 166/101 (BP Location: Right Arm)   Pulse (!) 102   Temp 98.2 F (36.8 C) (Oral)   Resp 20   Ht 1.829 m (6')   Wt 88.9 kg   SpO2 96%   BMI 26.58 kg/m   Physical Exam Vitals and nursing note reviewed.  Constitutional:      Appearance: He is well-developed.  HENT:     Head: Normocephalic and atraumatic.  Eyes:     Extraocular Movements: Extraocular movements intact.     Conjunctiva/sclera: Conjunctivae normal.     Pupils: Pupils are equal, round, and reactive to light.  Cardiovascular:     Rate and Rhythm: Normal rate and regular rhythm.     Heart sounds: No murmur heard.   Pulmonary:     Effort: Pulmonary effort is normal. No respiratory distress.     Breath sounds: Normal breath sounds.  Abdominal:     Palpations: Abdomen is soft.     Tenderness: There is no abdominal tenderness.     Comments: Suprapubic catheter in place. A little bit of crusted blood at the opening. No significant erythema. Not draining.  Musculoskeletal:        General: No swelling. Normal range of motion.     Cervical back: Neck supple.  Skin:    General: Skin is  warm and dry.  Neurological:     General: No focal deficit present.     Mental Status: He is alert and oriented to person, place, and time.     ED Results / Procedures / Treatments   Labs (all labs ordered are listed, but only abnormal results are displayed) Labs Reviewed  URINALYSIS, ROUTINE W REFLEX MICROSCOPIC - Abnormal; Notable for the following components:      Result Value   APPearance HAZY (*)    Hgb urine dipstick LARGE (*)    Protein, ur 100 (*)    Leukocytes,Ua LARGE (*)    WBC, UA >50 (*)    Bacteria,  UA FEW (*)    All other components within normal limits  CBG MONITORING, ED - Abnormal; Notable for the following components:   Glucose-Capillary 127 (*)    All other components within normal limits  URINE CULTURE  BASIC METABOLIC PANEL  CBC    EKG None  Radiology No results found.  Procedures Procedures (including critical care time)  Medications Ordered in ED Medications  ciprofloxacin (CIPRO) tablet 500 mg (500 mg Oral Given 07/08/19 1945)    ED Course  I have reviewed the triage vital signs and the nursing notes.  Pertinent labs & imaging results that were available during my care of the patient were reviewed by me and considered in my medical decision making (see chart for details).    MDM Rules/Calculators/A&P                          Suprapubic catheter was removed. 1 is disconnected from the back still no drainage. Balloon deflated. Removed patient with a fair amount of urine in the bladder plug that with my finger we inserted same size catheter inflated the balloon pulled it back connected to his bag. Patient had about a liter of fluid that came out. Initially it was kind of cloudy but then it turned into more just purulent. This may have been the reason why it clogged off. Urinalysis's although suprapubic is suggestive of urinary tract infection but patient nontoxic. We will go ahead and do a urine culture and start him on Cipro. He has an allergy  to penicillin. We will have patient follow-up with urology next week to check the urine culture to make sure 1 does he need antibiotics to did we choose the right antibiotic.  Catheter is flowing very well following replacement.  Patient tolerated the procedure well. Final Clinical Impression(s) / ED Diagnoses Final diagnoses:  Suprapubic catheter dysfunction, initial encounter ([place])  Acute cystitis without hematuria    Rx / DC Orders ED Discharge Orders         Ordered    ciprofloxacin (CIPRO) 500 MG tablet  2 times daily     Discontinue  Reprint     07/08/19 1955           Fredia Sorrow, MD 07/08/19 2010

## 2019-07-11 LAB — URINE CULTURE: Culture: >=100000 — AB

## 2020-01-13 HISTORY — PX: CATARACT EXTRACTION: SUR2

## 2020-06-27 HISTORY — PX: TOE AMPUTATION: SHX809

## 2021-02-11 ENCOUNTER — Encounter (HOSPITAL_COMMUNITY): Payer: Self-pay | Admitting: Emergency Medicine

## 2021-02-11 ENCOUNTER — Other Ambulatory Visit: Payer: Self-pay

## 2021-02-11 ENCOUNTER — Observation Stay (HOSPITAL_COMMUNITY)
Admission: EM | Admit: 2021-02-11 | Discharge: 2021-02-13 | Disposition: A | Discharge status: Home / Self Care | Payer: Medicare HMO | Attending: Internal Medicine | Admitting: Internal Medicine

## 2021-02-11 DIAGNOSIS — Z7901 Long term (current) use of anticoagulants: Secondary | ICD-10-CM | POA: Insufficient documentation

## 2021-02-11 DIAGNOSIS — R262 Difficulty in walking, not elsewhere classified: Secondary | ICD-10-CM | POA: Diagnosis not present

## 2021-02-11 DIAGNOSIS — N390 Urinary tract infection, site not specified: Secondary | ICD-10-CM | POA: Diagnosis not present

## 2021-02-11 DIAGNOSIS — N400 Enlarged prostate without lower urinary tract symptoms: Secondary | ICD-10-CM | POA: Diagnosis present

## 2021-02-11 DIAGNOSIS — Z85850 Personal history of malignant neoplasm of thyroid: Secondary | ICD-10-CM | POA: Diagnosis not present

## 2021-02-11 DIAGNOSIS — I10 Essential (primary) hypertension: Secondary | ICD-10-CM | POA: Diagnosis present

## 2021-02-11 DIAGNOSIS — I482 Chronic atrial fibrillation, unspecified: Secondary | ICD-10-CM | POA: Diagnosis present

## 2021-02-11 DIAGNOSIS — N401 Enlarged prostate with lower urinary tract symptoms: Secondary | ICD-10-CM | POA: Diagnosis not present

## 2021-02-11 DIAGNOSIS — I2510 Atherosclerotic heart disease of native coronary artery without angina pectoris: Secondary | ICD-10-CM | POA: Diagnosis not present

## 2021-02-11 DIAGNOSIS — R339 Retention of urine, unspecified: Secondary | ICD-10-CM | POA: Diagnosis not present

## 2021-02-11 DIAGNOSIS — E119 Type 2 diabetes mellitus without complications: Secondary | ICD-10-CM | POA: Diagnosis not present

## 2021-02-11 DIAGNOSIS — Z7984 Long term (current) use of oral hypoglycemic drugs: Secondary | ICD-10-CM | POA: Insufficient documentation

## 2021-02-11 DIAGNOSIS — Z79899 Other long term (current) drug therapy: Secondary | ICD-10-CM | POA: Insufficient documentation

## 2021-02-11 DIAGNOSIS — E872 Acidosis, unspecified: Secondary | ICD-10-CM | POA: Diagnosis present

## 2021-02-11 DIAGNOSIS — R55 Syncope and collapse: Secondary | ICD-10-CM | POA: Diagnosis present

## 2021-02-11 DIAGNOSIS — I959 Hypotension, unspecified: Secondary | ICD-10-CM | POA: Diagnosis not present

## 2021-02-11 DIAGNOSIS — E663 Overweight: Secondary | ICD-10-CM | POA: Diagnosis present

## 2021-02-11 DIAGNOSIS — Z20822 Contact with and (suspected) exposure to covid-19: Secondary | ICD-10-CM | POA: Insufficient documentation

## 2021-02-11 DIAGNOSIS — Z9359 Other cystostomy status: Secondary | ICD-10-CM

## 2021-02-11 LAB — URINALYSIS, ROUTINE W REFLEX MICROSCOPIC
Bilirubin Urine: NEGATIVE
Glucose, UA: NEGATIVE mg/dL
Hgb urine dipstick: NEGATIVE
Ketones, ur: NEGATIVE mg/dL
Nitrite: POSITIVE — AB
Protein, ur: 30 mg/dL — AB
Specific Gravity, Urine: 1.01 (ref 1.005–1.030)
pH: 7 (ref 5.0–8.0)

## 2021-02-11 LAB — CBC
HCT: 39.2 % (ref 39.0–52.0)
Hemoglobin: 13.2 g/dL (ref 13.0–17.0)
MCH: 31 pg (ref 26.0–34.0)
MCHC: 33.7 g/dL (ref 30.0–36.0)
MCV: 92 fL (ref 80.0–100.0)
Platelets: 174 10*3/uL (ref 150–400)
RBC: 4.26 MIL/uL (ref 4.22–5.81)
RDW: 15.1 % (ref 11.5–15.5)
WBC: 6.6 10*3/uL (ref 4.0–10.5)
nRBC: 0 % (ref 0.0–0.2)

## 2021-02-11 LAB — LACTIC ACID, PLASMA
Lactic Acid, Venous: 1.7 mmol/L (ref 0.5–1.9)
Lactic Acid, Venous: 2 mmol/L (ref 0.5–1.9)
Lactic Acid, Venous: 1 mmol/L (ref 0.5–1.9)

## 2021-02-11 LAB — BASIC METABOLIC PANEL
Anion gap: 7 (ref 5–15)
BUN: 21 mg/dL (ref 8–23)
CO2: 28 mmol/L (ref 22–32)
Calcium: 8.1 mg/dL — ABNORMAL LOW (ref 8.9–10.3)
Chloride: 96 mmol/L — ABNORMAL LOW (ref 98–111)
Creatinine, Ser: 1.24 mg/dL (ref 0.61–1.24)
GFR, Estimated: >60 mL/min (ref >60)
Glucose, Bld: 130 mg/dL — ABNORMAL HIGH (ref 70–99)
Potassium: 4 mmol/L (ref 3.5–5.1)
Sodium: 131 mmol/L — ABNORMAL LOW (ref 135–145)

## 2021-02-11 LAB — CBC WITH DIFFERENTIAL/PLATELET
Abs Immature Granulocytes: 0.01 10*3/uL (ref 0.00–0.07)
Basophils Absolute: 0 10*3/uL (ref 0.0–0.1)
Basophils Relative: 0 %
Eosinophils Absolute: 0.1 10*3/uL (ref 0.0–0.5)
Eosinophils Relative: 1 %
HCT: 39 % (ref 39.0–52.0)
Hemoglobin: 13.1 g/dL (ref 13.0–17.0)
Immature Granulocytes: 0 %
Lymphocytes Relative: 21 %
Lymphs Abs: 1.2 10*3/uL (ref 0.7–4.0)
MCH: 31 pg (ref 26.0–34.0)
MCHC: 33.6 g/dL (ref 30.0–36.0)
MCV: 92.2 fL (ref 80.0–100.0)
Monocytes Absolute: 0.7 10*3/uL (ref 0.1–1.0)
Monocytes Relative: 11 %
Neutro Abs: 3.9 10*3/uL (ref 1.7–7.7)
Neutrophils Relative %: 67 %
Platelets: 160 10*3/uL (ref 150–400)
RBC: 4.23 MIL/uL (ref 4.22–5.81)
RDW: 14.9 % (ref 11.5–15.5)
WBC: 5.9 10*3/uL (ref 4.0–10.5)
nRBC: 0 % (ref 0.0–0.2)

## 2021-02-11 LAB — TROPONIN I (HIGH SENSITIVITY)
Troponin I (High Sensitivity): 10 ng/L (ref <18)
Troponin I (High Sensitivity): 10 ng/L (ref <18)

## 2021-02-11 LAB — RESP PANEL BY RT-PCR (FLU A&B, COVID) ARPGX2
Influenza A by PCR: NEGATIVE
Influenza B by PCR: NEGATIVE
SARS Coronavirus 2 by RT PCR: NEGATIVE

## 2021-02-11 LAB — PROTIME-INR
INR: 1.2 (ref 0.8–1.2)
Prothrombin Time: 15.5 seconds — ABNORMAL HIGH (ref 11.4–15.2)

## 2021-02-11 LAB — APTT: aPTT: 34 seconds (ref 24–36)

## 2021-02-11 MED ORDER — SODIUM CHLORIDE 0.9 % IV SOLN
1.0000 g | INTRAVENOUS | Status: DC
  Administered 2021-02-12: 1 g via INTRAVENOUS
  Filled 2021-02-11 (×2): qty 10

## 2021-02-11 MED ORDER — SODIUM CHLORIDE 0.9% FLUSH
3.0000 mL | Freq: Two times a day (BID) | INTRAVENOUS | Status: DC
  Administered 2021-02-12 (×2): 3 mL via INTRAVENOUS

## 2021-02-11 MED ORDER — ACETAMINOPHEN 650 MG RE SUPP: 650.0000 mg | Freq: Four times a day (QID) | RECTAL | Status: DC | PRN

## 2021-02-11 MED ORDER — POLYETHYLENE GLYCOL 3350 17 G PO PACK: 17.0000 g | PACK | Freq: Every day | ORAL | Status: DC | PRN

## 2021-02-11 MED ORDER — SODIUM CHLORIDE 0.9 % IV BOLUS
1000.0000 mL | Freq: Once | INTRAVENOUS | Status: AC
  Administered 2021-02-11: 1000 mL via INTRAVENOUS

## 2021-02-11 MED ORDER — ACETAMINOPHEN 325 MG PO TABS: 650.0000 mg | ORAL_TABLET | Freq: Four times a day (QID) | ORAL | Status: DC | PRN

## 2021-02-11 MED ORDER — SODIUM CHLORIDE 0.9 % IV SOLN: INTRAVENOUS | Status: DC

## 2021-02-11 MED ORDER — LACTATED RINGERS IV BOLUS
1000.0000 mL | Freq: Once | INTRAVENOUS | Status: AC
  Administered 2021-02-11: 1000 mL via INTRAVENOUS

## 2021-02-11 MED ORDER — ONDANSETRON HCL 4 MG/2ML IJ SOLN: 4.0000 mg | Freq: Four times a day (QID) | INTRAMUSCULAR | Status: DC | PRN

## 2021-02-11 MED ORDER — ONDANSETRON HCL 4 MG PO TABS: 4.0000 mg | ORAL_TABLET | Freq: Four times a day (QID) | ORAL | Status: DC | PRN

## 2021-02-11 MED ORDER — CEFTRIAXONE SODIUM 1 G IJ SOLR
1.0000 g | Freq: Once | INTRAMUSCULAR | Status: AC
  Administered 2021-02-11: 1 g via INTRAVENOUS
  Filled 2021-02-11: qty 10

## 2021-02-11 NOTE — Assessment & Plan Note (Signed)
Continue catheter care.

## 2021-02-11 NOTE — Assessment & Plan Note (Signed)
Not sepsis.  Secondary to intravascular volume depletion.  Fluid resuscitation.  Recheck labs this evening and in the morning.

## 2021-02-11 NOTE — Assessment & Plan Note (Signed)
Rate controlled continue Eliquis

## 2021-02-11 NOTE — Assessment & Plan Note (Signed)
Sliding scale:

## 2021-02-11 NOTE — H&P (Signed)
History and Physical    Patient: Paul Bradley:[id number] DOB: 1945/10/04 DOA: 02/11/2021 DOS: the patient was seen and examined on 02/11/2021 PCP: [name], MD  Patient coming from: Home  Chief Complaint:  Chief Complaint  Patient presents with   Near Syncope    HPI: Paul Bradley is a 76 year old male with past medical history of atrial fibrillation on Eliquis, metastatic thyroid cancer on chemotherapy and suprapubic catheter secondary to urinary retention who was at the urology office when all of a sudden he had a spell who became less responsive.  Vital signs were checked and he was noted to be hypotensive.  Patient was brought into the emergency room where he was noted to still be hypotensive as well as found to have a very large urinary tract infection.  Vital signs otherwise stable patient not felt to be in sepsis.  He was given IV fluids and antibiotics.  Lactic acid level initially at 1.7, but repeat elevated at 2.0.  Review of Systems: As mentioned in the history of present illness. All other systems reviewed and are negative.  Patient feeling better.  Denies any dizziness Past Medical History:  Diagnosis Date   Abscess of left hip    "healing wound" per [facility] Heme/Onc MD note 06/03/18   Assistance needed for mobility    walker   Atrial fibrillation ([place])    BPH (benign prostatic hyperplasia)    Cancer (HCC)    thyroid   Chronic anticoagulation    Coronary artery disease    Diabetes mellitus without complication ([place])    Hematochezia 05/05/2018   colonoscopy at [facility] with diverticulosis    Hypertension    Thyroid disease    Past Surgical History:  Procedure Laterality Date   THYROIDECTOMY     Social History:  reports that he has never smoked. He has never used smokeless tobacco. He reports that he does not drink alcohol and does not use drugs.  Lives at home alone.  Ambulates with a walker.  Allergies  Allergen Reactions   Penicillins Rash     Did it involve swelling of the face/tongue/throat, SOB, or low BP? Unknown Did it involve sudden or severe rash/hives, skin peeling, or any reaction on the inside of your mouth or nose? Unknown Did you need to seek medical attention at a hospital or doctor's office? Unknown When did it last happen?       If all above answers are NO, may proceed with cephalosporin use.     Family History  Problem Relation Age of Onset   Prostate cancer Father    Colon cancer Neg Hx    Colon polyps Neg Hx     Prior to Admission medications   Medication Sig Start Date End Date Taking? Authorizing Provider  amLODipine (NORVASC) 5 MG tablet Take 2.5-5 mg by mouth See admin instructions. 2.5mg  at bedtime during 2 weeks on [place], 5mg  at bedtime during the week off of therapy. 12/27/20  Yes [provider]  apixaban (ELIQUIS) 5 MG TABS tablet Take 5 mg by mouth 2 (two) times daily.   Yes [provider]  Ascorbic Acid (VITAMIN C) 1000 MG tablet Take 1,000 mg by mouth daily.   Yes [provider]  atorvastatin (LIPITOR) 40 MG tablet Take 40 mg by mouth at bedtime. 01/14/21  Yes [provider]  calcitRIOL (ROCALTROL) 0.5 MCG capsule Take 0.5 mcg by mouth daily.   Yes [provider]  lenvatinib 10 mg daily dose (  LENVIMA, 10 MG DAILY DOSE,) capsule Take 10 mg by mouth See admin instructions. 10mg  daily for 2 weeks, off for 1 week, then repeat. 01/02/21  Yes [provider]  levothyroxine (SYNTHROID, LEVOTHROID) 150 MCG tablet Take 150 mcg by mouth daily before breakfast.   Yes [provider]  lisinopril (ZESTRIL) 20 MG tablet Take 30 mg by mouth daily. 01/14/21  Yes [provider]  methenamine (HIPREX) 1 g tablet Take 1 g by mouth 2 (two) times daily. 12/14/20  Yes [provider]  metoprolol succinate (TOPROL-XL) 50 MG 24 hr tablet Take 50 mg by mouth daily. 01/14/21  Yes [provider]  Multiple Vitamin (MULTIVITAMIN) tablet  Take 1 tablet by mouth daily.   Yes [provider]    Physical Exam: Vitals:   02/11/21 1543 02/11/21 1815 02/11/21 1900 02/11/21 1928  BP:  132/76 (!) 141/80 (!) 145/84  Pulse:  69 66 65  Resp:  14 (!) 21 14  Temp:    97.9 F (36.6 C)  TempSrc:    Oral  SpO2:  99% 99% 100%  Weight: 89.8 kg     Height: 6\' 1"  (1.854 m)      General: Alert and oriented x3, no acute distress HEENT: Normocephalic and atraumatic, mucous membranes are slightly dry Neck: Supple, no JVD Cardiovascular: Irregular rhythm, rate controlled Lungs: Clear to auscultation bilaterally Abdomen: Soft, nontender, nondistended, positive bowel sounds, suprapubic catheter noted Extremities: No clubbing or cyanosis or edema. Neuro: No focal deficits Skin: No skin breaks, tears or lesions Psychiatry: Patient is appropriate, no evidence of psychoses  Data Reviewed:  EKG reviewed noting low voltage, atrial fibrillation, rate controlled Other lab work noteworthy for lactic acid initially at 1.7 with repeat at 2.0.  Third set pending.  Rest of patient labs are unremarkable  Assessment and Plan: * Acute lower UTI- (present on admission) History of suprapubic catheter.  IV Rocephin.  Urine cultures pending.  Sepsis ruled out.  Hypotension- (present on admission) Secondary to UTI.  Dehydration, not sepsis.  Fluid resuscitation.  Suprapubic catheter ([place]) Continue catheter care.  Chronic a-fib on Eliquis- (present on admission) Rate controlled continue Eliquis  Hypertension- (present on admission) Meds held for now due to hypotension  Diabetes mellitus without complication (HCC) Sliding scale.  Coronary artery disease- (present on admission) Stable  Overweight (BMI 25.0-29.9)- (present on admission) Meets criteria for BMI greater than 25  BPH (benign prostatic hyperplasia)- (present on admission) Continue Flomax.  The cause of patient's suprapubic catheter  Lactic acidosis- (present on  admission) Not sepsis.  Secondary to intravascular volume depletion.  Fluid resuscitation.  Recheck labs this evening and in the morning.    Advance Care Planning: DNR as confirmed by patient.  Consults: None  Family Communication: Left message with sister  Severity of Illness: The appropriate patient status for this patient is OBSERVATION. Observation status is judged to be reasonable and necessary in order to provide the required intensity of service to ensure the patient's safety. The patient's presenting symptoms, physical exam findings, and initial radiographic and laboratory data in the context of their medical condition is felt to place them at decreased risk for further clinical deterioration. Furthermore, it is anticipated that the patient will be medically stable for discharge from the hospital within 2 midnights of admission.   Author: Annita Brod, MD 02/11/2021 8:40 PM  For on call review [email or web address].

## 2021-02-11 NOTE — ED Provider Notes (Signed)
[facility] DEPT Provider Note   CSN: [id number] Arrival date & time: 02/11/21  1002     History  Chief Complaint  Patient presents with   Near Syncope    Paul Bradley is a 76 y.o. male.  HPI History obtained from patient and from his sister who is at the bedside. Records from [facility] reviewed 76 year old male history of A. fib, on Eliquis, metastatic thyroid cancer, on chronic oral chemotherapy for same, with suprapubic catheter secondary to urinary retention presents today with near syncopal episode.  His sister reports he has been in his usual state of health.  They were at the urology office.  He was sitting in the chair.  She reports that he got antsy.  He then states he felt lightheaded.  He began to be less responsive and stared into space for 5 to 10 minutes.  He never lost postural tension but was not verbally interactive during this time.  In the doctor's office, at that time, they checked his blood sugar and it was in the 57X and systolic blood pressure was initially not obtainable and then was 94.  His sister reports that he takes lenvatinib for his thyroid cancer, it causes hypertension, he is only able to tolerate it for 2 weeks on with 1 week off.  During the time he is taking the lenvatinib, he takes 5 mg of his blood pressure medicine and during the off week he only takes 2.5 of amlodipine.  He is on the last day of his 2-week cycle.  He normally lives independently and cares for himself.  He walks with a cane.  He prepares and eats his own food.  He has been in his usual state of health and has been eating, drinking, and going about his normal activities till this occurred today. He currently does not complain of any headache, head injury, neck pain, chest pain, nausea, vomiting, diarrhea, fever, chills, dyspnea and feels that he is back to baseline He does have some wounds on his foot that are followed by podiatry.  His sister feels that  the wound on the ball of the left foot is about the same.  He has not noted any redness, pus, or discharge from this area.  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 Authorizing Provider  amLODipine (NORVASC) 5 MG tablet Take 2.5-5 mg by mouth See admin instructions. 2.5mg  at bedtime during 2 weeks on Lenvima, 5mg  at bedtime during the week off of therapy. 12/27/20  Yes [provider]  apixaban (ELIQUIS) 5 MG TABS tablet Take 5 mg by mouth 2 (two) times daily.   Yes [provider]  Ascorbic Acid (VITAMIN C) 1000 MG tablet Take 1,000 mg by mouth daily.   Yes [provider]  atorvastatin (LIPITOR) 40 MG tablet Take 40 mg by mouth at bedtime. 01/14/21  Yes [provider]  calcitRIOL (ROCALTROL) 0.5 MCG capsule Take 0.5 mcg by mouth daily.   Yes [provider]  lenvatinib 10 mg daily dose (LENVIMA, 10 MG DAILY DOSE,) capsule Take 10 mg by mouth See admin instructions. 10mg  daily for 2 weeks, off for 1 week, then repeat. 01/02/21  Yes [provider]  levothyroxine (SYNTHROID, LEVOTHROID) 150 MCG tablet Take 150 mcg  by mouth daily before breakfast.   Yes [provider]  lisinopril (ZESTRIL) 20 MG tablet Take 30 mg by mouth daily. 01/14/21  Yes [provider]  methenamine (HIPREX) 1 g tablet Take 1 g by mouth 2 (two) times daily. 12/14/20  Yes [provider]  metoprolol succinate (TOPROL-XL) 50 MG 24 hr tablet Take 50 mg by mouth daily. 01/14/21  Yes [provider]  Multiple Vitamin (MULTIVITAMIN) tablet Take 1 tablet by mouth daily.   Yes [provider]      Allergies    Penicillins    Review of Systems   Review of Systems  All other systems reviewed and are negative.  Physical Exam Updated  Vital Signs BP 122/73    Pulse 61    Temp 98.3 F (36.8 C) (Rectal)    Resp 18    Ht 1.854 m ($Remove'6\' 1"'qaprcQF$ )    Wt 89.8 kg    SpO2 100%    BMI 26.12 kg/m  Physical Exam Vitals and nursing note reviewed.  Constitutional:      Appearance: Normal appearance.  HENT:     Head: Normocephalic and atraumatic.     Right Ear: External ear normal.     Left Ear: External ear normal.     Nose: Nose normal.     Mouth/Throat:     Mouth: Mucous membranes are dry.     Pharynx: Oropharynx is clear.  Eyes:     Extraocular Movements: Extraocular movements intact.     Pupils: Pupils are equal, round, and reactive to light.  Cardiovascular:     Rate and Rhythm: Normal rate. Rhythm irregular.     Pulses: Normal pulses.     Heart sounds: Normal heart sounds.  Pulmonary:     Effort: Pulmonary effort is normal.     Breath sounds: Normal breath sounds.  Abdominal:     General: Abdomen is flat.     Palpations: Abdomen is soft.  Musculoskeletal:        General: No swelling or tenderness. Normal range of motion.     Cervical back: Normal range of motion.     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     Capillary Refill: Capillary refill takes less than 2 seconds.     Coloration: Skin is pale.  Neurological:     General: No focal deficit present.     Mental Status: He is alert.     Cranial Nerves: No cranial nerve deficit.     Motor: No weakness.     Coordination: Coordination normal.     Deep Tendon Reflexes: Reflexes normal.  Psychiatric:        Mood and Affect: Mood normal.        Behavior: Behavior normal.    ED Results / Procedures / Treatments   Labs (all labs ordered are listed, but only abnormal results are displayed) Labs Reviewed  BASIC METABOLIC PANEL - Abnormal; Notable for the following components:      Result Value    Sodium 131 (*)    Chloride 96 (*)    Glucose, Bld 130 (*)    Calcium 8.1 (*)    All other components within normal limits  URINALYSIS, ROUTINE W REFLEX MICROSCOPIC - Abnormal; Notable for the  following components:   APPearance HAZY (*)    Protein, ur 30 (*)    Nitrite POSITIVE (*)    Leukocytes,Ua LARGE (*)    Bacteria, UA MANY (*)    All other components within normal limits  LACTIC ACID, PLASMA - Abnormal; Notable for the following components:   Lactic Acid, Venous 2.0 (*)    All other components within normal limits  URINE CULTURE  CULTURE, BLOOD (ROUTINE X 2)  CULTURE, BLOOD (ROUTINE X 2)  RESP PANEL BY RT-PCR (FLU A&B, COVID) ARPGX2  CBC  LACTIC ACID, PLASMA  CBC WITH DIFFERENTIAL/PLATELET  PROTIME-INR  APTT  TROPONIN I (HIGH SENSITIVITY)  TROPONIN I (HIGH SENSITIVITY)    EKG EKG Interpretation  Date/Time:  Tuesday February 11 2021 10:17:10 EST Ventricular Rate:  57 PR Interval:    QRS Duration: 118 QT Interval:  447 QTC Calculation: 436 R Axis:   70 Text Interpretation: Atrial fibrillation Nonspecific intraventricular conduction delay Low voltage, extremity leads Borderline repolarization abnormality Confirmed by Pattricia Boss [phone]) on 02/11/2021 10:29:59 AM  Radiology No results found.  Procedures Procedures    Medications Ordered in ED Medications  cefTRIAXone (ROCEPHIN) 1 g in sodium chloride 0.9 % 100 mL IVPB (1 g Intravenous New Bag/Given 02/11/21 1542)  lactated ringers bolus 1,000 mL (has no administration in time range)  sodium chloride 0.9 % bolus 1,000 mL (0 mLs Intravenous Stopped 02/11/21 1246)    ED Course/ Medical Decision Making/ A&P Clinical Course as of 02/11/21 1600  Tue Feb 11, 2021  1525 CBC normal Be met reveals mild hyponatremia at 131, glucose elevated at 130 Urinalysis is significant for nitrite positive, many bacteria, 21-50 white blood cells, white blood cells in clumps.  Urine culture is sent Initial lactic acid is elevated  2.0 [DR]    Clinical Course User Index [DR] Pattricia Boss, MD                           Medical Decision Making Amount and/or Complexity of Data Reviewed Independent Historian: caregiver    Details: Sister present in ED who sees him on a regular basis and was with him today in the urology office. Labs: ordered. Decision-making details documented in ED Course. Radiology: ordered. Decision-making details documented in ED Course. ECG/medicine tests: ordered. Decision-making details documented in ED Course.   76 year old man with near syncope and borderline hypotension. Differential diagnosis including infection, arrhythmia, patient with A. fib with heart rate in the 50s, other cardiac etiologies, bleeding- patient on eliquis for chronic a fib, seizure Patient with labs significant for urinary tract infection.  Urine is being cultured and patient is receiving IV Rocephin.  Additionally patient receiving LR bolus of 1 L. He has remained hemodynamically stable with last blood pressure at 120/60.  Lactic is slightly increased from 1.7-2. Plan admission for ongoing monitoring, IV antibiotics, and IV fluids. Discussed care with Dr. Maryland Pink, , On-call for hospitalist. I think symptoms are most consistent with some volume depletion and urinary tract infection.  Less likely is acute coronary syndrome or event.  Patient's troponin and repeat troponin were normal.  Patient has chronic A. fib and EKG G appears unchanged from prior He does not appear to have any acute neurological event.  He is awake and alert and oriented and there was no evidence of seizure or period of time when he appeared postictal Patient is currently hemodynamically stable.  He will be admitted for ongoing IV antibiotics, monitoring, and  treatment.       Final Clinical Impression(s) / ED Diagnoses Final diagnoses:  Near syncope  Urinary tract infection without hematuria, site unspecified    Rx / DC Orders ED Discharge  Orders     None         Pattricia Boss, MD 02/11/21 1600

## 2021-02-11 NOTE — Assessment & Plan Note (Signed)
Meets criteria for BMI greater than 25 

## 2021-02-11 NOTE — Assessment & Plan Note (Addendum)
Secondary to UTI.  Dehydration, not sepsis.  Fluid resuscitation.

## 2021-02-11 NOTE — Hospital Course (Signed)
Patient is a 76 year old male with past medical history of atrial fibrillation on Eliquis, metastatic thyroid cancer on chemotherapy and suprapubic catheter secondary to urinary retention who was at the urology office when all of a sudden he had a spell who became less responsive.  Vital signs were checked and he was noted to be hypotensive.  Patient was brought into the emergency room where he was noted to still be hypotensive as well as found to have a very large urinary tract infection.  Vital signs otherwise stable patient not felt to be in sepsis.  He was given IV fluids and antibiotics.  Lactic acid level initially at 1.7, but repeat elevated at 2.0.

## 2021-02-11 NOTE — Assessment & Plan Note (Signed)
Continue Flomax.  The cause of patient's suprapubic catheter

## 2021-02-11 NOTE — Assessment & Plan Note (Signed)
Stable

## 2021-02-11 NOTE — Assessment & Plan Note (Addendum)
History of suprapubic catheter.  IV Rocephin.  Urine cultures pending.  Sepsis ruled out.

## 2021-02-11 NOTE — Assessment & Plan Note (Signed)
Meds held for now due to hypotension

## 2021-02-11 NOTE — ED Triage Notes (Signed)
Pt from urology office via [facility]. Pt reports while waiting in the waiting room he had a near syncopal episode. Pts systolic pressure in the 10Z upon EMS arrival. Pt was given 250ml fluid. CBG 98. Per EMS sister reports pt had a moment where he "was starring off."

## 2021-02-12 DIAGNOSIS — N390 Urinary tract infection, site not specified: Secondary | ICD-10-CM | POA: Diagnosis not present

## 2021-02-12 LAB — BASIC METABOLIC PANEL
Anion gap: 7 (ref 5–15)
BUN: 16 mg/dL (ref 8–23)
CO2: 25 mmol/L (ref 22–32)
Calcium: 7.8 mg/dL — ABNORMAL LOW (ref 8.9–10.3)
Chloride: 101 mmol/L (ref 98–111)
Creatinine, Ser: 0.99 mg/dL (ref 0.61–1.24)
GFR, Estimated: >60 mL/min (ref >60)
Glucose, Bld: 94 mg/dL (ref 70–99)
Potassium: 3.9 mmol/L (ref 3.5–5.1)
Sodium: 133 mmol/L — ABNORMAL LOW (ref 135–145)

## 2021-02-12 LAB — GLUCOSE, CAPILLARY
Glucose-Capillary: 112 mg/dL — ABNORMAL HIGH (ref 70–99)
Glucose-Capillary: 102 mg/dL — ABNORMAL HIGH (ref 70–99)

## 2021-02-12 LAB — CBC
HCT: 37.4 % — ABNORMAL LOW (ref 39.0–52.0)
Hemoglobin: 12.6 g/dL — ABNORMAL LOW (ref 13.0–17.0)
MCH: 30.7 pg (ref 26.0–34.0)
MCHC: 33.7 g/dL (ref 30.0–36.0)
MCV: 91 fL (ref 80.0–100.0)
Platelets: 166 10*3/uL (ref 150–400)
RBC: 4.11 MIL/uL — ABNORMAL LOW (ref 4.22–5.81)
RDW: 15 % (ref 11.5–15.5)
WBC: 5.2 10*3/uL (ref 4.0–10.5)
nRBC: 0 % (ref 0.0–0.2)

## 2021-02-12 MED ORDER — APIXABAN 5 MG PO TABS
5.0000 mg | ORAL_TABLET | Freq: Two times a day (BID) | ORAL | Status: DC
  Administered 2021-02-12 – 2021-02-13 (×3): 5 mg via ORAL
  Filled 2021-02-12 (×3): qty 1

## 2021-02-12 MED ORDER — ATORVASTATIN CALCIUM 40 MG PO TABS
40.0000 mg | ORAL_TABLET | Freq: Every day | ORAL | Status: DC
  Administered 2021-02-12: 40 mg via ORAL
  Filled 2021-02-12: qty 1

## 2021-02-12 MED ORDER — ADULT MULTIVITAMIN W/MINERALS CH
1.0000 | ORAL_TABLET | Freq: Every day | ORAL | Status: DC
  Administered 2021-02-12 – 2021-02-13 (×2): 1 via ORAL
  Filled 2021-02-12 (×2): qty 1

## 2021-02-12 MED ORDER — ASCORBIC ACID 500 MG PO TABS
1000.0000 mg | ORAL_TABLET | Freq: Every day | ORAL | Status: DC
  Administered 2021-02-12 – 2021-02-13 (×2): 1000 mg via ORAL
  Filled 2021-02-12 (×2): qty 2

## 2021-02-12 MED ORDER — METOPROLOL SUCCINATE ER 50 MG PO TB24
50.0000 mg | ORAL_TABLET | Freq: Every day | ORAL | Status: DC
  Administered 2021-02-12: 50 mg via ORAL
  Filled 2021-02-12 (×2): qty 1

## 2021-02-12 MED ORDER — METHENAMINE MANDELATE 0.5 G PO TABS
1.0000 g | ORAL_TABLET | Freq: Two times a day (BID) | ORAL | Status: DC
  Administered 2021-02-12 – 2021-02-13 (×3): 1 g via ORAL
  Filled 2021-02-12 (×3): qty 2

## 2021-02-12 MED ORDER — CALCITRIOL 0.5 MCG PO CAPS
0.5000 ug | ORAL_CAPSULE | Freq: Every day | ORAL | Status: DC
  Administered 2021-02-12 – 2021-02-13 (×2): 0.5 ug via ORAL
  Filled 2021-02-12 (×2): qty 1

## 2021-02-12 MED ORDER — LEVOTHYROXINE SODIUM 75 MCG PO TABS
150.0000 ug | ORAL_TABLET | Freq: Every day | ORAL | Status: DC
  Administered 2021-02-12 – 2021-02-13 (×2): 150 ug via ORAL
  Filled 2021-02-12: qty 1
  Filled 2021-02-12: qty 2

## 2021-02-12 NOTE — Progress Notes (Signed)
PROGRESS NOTE    Paul Bradley  JSH:[id number] DOB: [birth year] DOA: 02/11/2021 PCP: [name], MD    Brief Narrative:  76 year old gentleman with history of chronic A-fib on Eliquis, metastatic thyroid cancer on chemotherapy, chronic urinary retention with suprapubic catheter in place who was at urology office to change suprapubic catheter, he was sitting in chair and had dizziness, lethargy and less responsiveness.  He was noted to have blood pressure less than 90 so sent to ER.  Patient was not having any problem before going to urology office.  In the emergency room, systolic blood pressure 90, blood sugars adequate, admitted with near syncopal episode with abnormal urine from suprapubic catheter.   Assessment & Plan:   Acute UTI present on admission secondary to presence of indwelling suprapubic catheter in a patient with chronic urinary retention: Blood cultures and urine cultures pending.  Clinically improving. Exchange suprapubic catheter today. Continue Rocephin until final cultures.  Previous history of Klebsiella UTI. Does not have any evidence of sepsis.  Hypotension: Probably secondary to dehydration, ongoing use of antihypertensives at home.  Blood pressure is stabilized now.  Continue maintenance IV fluids and mobilize.  Chronic A-fib: Rate controlled.  Resume metoprolol as his blood pressures are better now, resume Eliquis as do not anticipate any procedures.  Essential hypertension: Holding antihypertensives including amlodipine and lisinopril, resume beta-blockers.  Type 2 diabetes without complications: On metformin at home.  Currently on sliding scale insulin.  Hypothyroidism: Euthyroid on Synthroid replacement.  Thyroid cancer: Followed by oncology as outpatient.   DVT prophylaxis:  apixaban (ELIQUIS) tablet 5 mg   Code Status: DNR Family Communication: None Disposition Plan: Status is: Observation The patient will require care spanning > 2  midnights and should be moved to inpatient because: IV antibiotics, catheter care  Planned Discharge Destination: Home          Consultants:  None  Procedures:  None  Antimicrobials:  Rocephin 1/31---   Subjective: Patient was seen and examined in the emergency room.  No overnight events.  He has not been mobilized so he is not sure whether he will get dizzy or lightheaded.  Afebrile overnight.  Denies any suprapubic pain or discomfort.  Denies any nausea or vomiting.  Objective: Vitals:   02/12/21 0321 02/12/21 0445 02/12/21 0530 02/12/21 0615  BP:  109/80 123/73 127/80  Pulse:  60 60 66  Resp:  11 17 (!) 21  Temp: 97.8 F (36.6 C)     TempSrc: Oral     SpO2:  96% 100% 98%  Weight:      Height:        Intake/Output Summary (Last 24 hours) at 02/12/2021 0842 Last data filed at 02/12/2021 0617 Gross per 24 hour  Intake 2000 ml  Output 2700 ml  Net -700 ml   Filed Weights   02/11/21 1109 02/11/21 1543  Weight: 88.5 kg 89.8 kg    Examination:  General exam: Appears calm and comfortable  Alert oriented x4.  Not in any distress.  Laying in the ER stretcher.  Looks comfortable. Respiratory system: Clear to auscultation. Respiratory effort normal.  No added sounds. Cardiovascular system: S1 & S2 heard, irregularly irregular. Gastrointestinal system: Abdomen is nondistended, soft and nontender. No organomegaly or masses felt. Normal bowel sounds heard. Suprapubic catheter, nontender and clean. Central nervous system: Alert and oriented. No focal neurological deficits. Extremities: Symmetric 5 x 5 power. Skin: No rashes, lesions or ulcers Psychiatry: Judgement and insight appear normal. Mood & affect  appropriate.     Data Reviewed: I have personally reviewed following labs and imaging studies  CBC: Recent Labs  Lab 02/11/21 1020 02/11/21 1534 02/12/21 0319  WBC 6.6 5.9 5.2  NEUTROABS  --  3.9  --   HGB 13.2 13.1 12.6*  HCT 39.2 39.0 37.4*  MCV 92.0 92.2  91.0  PLT 174 160 809   Basic Metabolic Panel: Recent Labs  Lab 02/11/21 1020 02/12/21 0319  NA 131* 133*  K 4.0 3.9  CL 96* 101  CO2 28 25  GLUCOSE 130* 94  BUN 21 16  CREATININE 1.24 0.99  CALCIUM 8.1* 7.8*   GFR: Estimated Creatinine Clearance: 71.7 mL/min (by C-G formula based on SCr of 0.99 mg/dL). Liver Function Tests: No results for input(s): AST, ALT, ALKPHOS, BILITOT, PROT, ALBUMIN in the last 168 hours. No results for input(s): LIPASE, AMYLASE in the last 168 hours. No results for input(s): AMMONIA in the last 168 hours. Coagulation Profile: Recent Labs  Lab 02/11/21 1534  INR 1.2   Cardiac Enzymes: No results for input(s): CKTOTAL, CKMB, CKMBINDEX, TROPONINI in the last 168 hours. BNP (last 3 results) No results for input(s): PROBNP in the last 8760 hours. HbA1C: No results for input(s): HGBA1C in the last 72 hours. CBG: No results for input(s): GLUCAP in the last 168 hours. Lipid Profile: No results for input(s): CHOL, HDL, LDLCALC, TRIG, CHOLHDL, LDLDIRECT in the last 72 hours. Thyroid Function Tests: No results for input(s): TSH, T4TOTAL, FREET4, T3FREE, THYROIDAB in the last 72 hours. Anemia Panel: No results for input(s): VITAMINB12, FOLATE, FERRITIN, TIBC, IRON, RETICCTPCT in the last 72 hours. Sepsis Labs: Recent Labs  Lab 02/11/21 1130 02/11/21 1305 02/11/21 2244  LATICACIDVEN 1.7 2.0* 1.0    Recent Results (from the past 240 hour(s))  Resp Panel by RT-PCR (Flu A&B, Covid) Nasopharyngeal Swab     Status: None   Collection Time: 02/11/21  4:01 PM   Specimen: Nasopharyngeal Swab; Nasopharyngeal(NP) swabs in vial transport medium  Result Value Ref Range Status   SARS Coronavirus 2 by RT PCR NEGATIVE NEGATIVE Final    Comment: (NOTE) SARS-CoV-2 target nucleic acids are NOT DETECTED.  The SARS-CoV-2 RNA is generally detectable in upper respiratory specimens during the acute phase of infection. The lowest concentration of SARS-CoV-2 viral  copies this assay can detect is 138 copies/mL. A negative result does not preclude SARS-Cov-2 infection and should not be used as the sole basis for treatment or other patient management decisions. A negative result may occur with  improper specimen collection/handling, submission of specimen other than nasopharyngeal swab, presence of viral mutation(s) within the areas targeted by this assay, and inadequate number of viral copies(<138 copies/mL). A negative result must be combined with clinical observations, patient history, and epidemiological information. The expected result is Negative.  Fact Sheet for Patients:  EntrepreneurPulse.com.au  Fact Sheet for Healthcare Providers:  IncredibleEmployment.be  This test is no t yet approved or cleared by the [place] FDA and  has been authorized for detection and/or diagnosis of SARS-CoV-2 by FDA under an Emergency Use Authorization (EUA). This EUA will remain  in effect (meaning this test can be used) for the duration of the COVID-19 declaration under Section 564(b)(1) of the Act, 21 U.S.C.section 360bbb-3(b)(1), unless the authorization is terminated  or revoked sooner.       Influenza A by PCR NEGATIVE NEGATIVE Final   Influenza B by PCR NEGATIVE NEGATIVE Final    Comment: (NOTE) The Xpert Xpress SARS-CoV-2/FLU/RSV plus assay  is intended as an aid in the diagnosis of influenza from Nasopharyngeal swab specimens and should not be used as a sole basis for treatment. Nasal washings and aspirates are unacceptable for Xpert Xpress SARS-CoV-2/FLU/RSV testing.  Fact Sheet for Patients: EntrepreneurPulse.com.au  Fact Sheet for Healthcare Providers: IncredibleEmployment.be  This test is not yet approved or cleared by the [place] FDA and has been authorized for detection and/or diagnosis of SARS-CoV-2 by FDA under an Emergency Use Authorization (EUA). This  EUA will remain in effect (meaning this test can be used) for the duration of the COVID-19 declaration under Section 564(b)(1) of the Act, 21 U.S.C. section 360bbb-3(b)(1), unless the authorization is terminated or revoked.  Performed at [facility], [place] [address] [place], [place] [address]          Radiology Studies: No results found.      Scheduled Meds:  apixaban  5 mg Oral BID   vitamin C  1,000 mg Oral Daily   atorvastatin  40 mg Oral QHS   calcitRIOL  0.5 mcg Oral Daily   levothyroxine  150 mcg Oral QAC breakfast   methenamine  1 g Oral BID   metoprolol succinate  50 mg Oral Daily   multivitamin  1 tablet Oral Daily   sodium chloride flush  3 mL Intravenous Q12H   Continuous Infusions:  sodium chloride 100 mL/hr at 02/11/21 2244   cefTRIAXone (ROCEPHIN)  IV       LOS: 0 days    Time spent: 35 minutes    Barb Merino, MD [facility] Pager [phone]

## 2021-02-13 DIAGNOSIS — N390 Urinary tract infection, site not specified: Secondary | ICD-10-CM | POA: Diagnosis not present

## 2021-02-13 LAB — URINE CULTURE: Culture: >=100000 — AB

## 2021-02-13 MED ORDER — CEFDINIR 300 MG PO CAPS: 300.0000 mg | ORAL_CAPSULE | Freq: Two times a day (BID) | ORAL | 0 refills | Status: AC

## 2021-02-13 MED ORDER — SODIUM CHLORIDE 0.9 % IV SOLN
1.0000 g | Freq: Once | INTRAVENOUS | Status: AC
  Administered 2021-02-13: 1 g via INTRAVENOUS
  Filled 2021-02-13: qty 10

## 2021-02-13 NOTE — Evaluation (Signed)
Physical Therapy One Time Evaluation Patient Details Name: Paul Bradley MRN: [id number] DOB: 06/11/1945 Today's Date: 02/13/2021  History of Present Illness  76 year old gentleman with history of chronic A-fib on Eliquis, type 2 diabetes mellitus, HTN, metastatic thyroid cancer on chemotherapy, chronic urinary retention with suprapubic catheter in place who was at urology office to change suprapubic catheter, he was sitting in chair and had dizziness, lethargy and less responsiveness.  Pt sent to ER and admitted for Acute UTI present on admission secondary to presence of indwelling suprapubic Foley catheter in place, chronic urinary retention.  Clinical Impression  Patient evaluated by Physical Therapy with no further acute PT needs identified. All education has been completed and the patient has no further questions.  Pt ambulated in hallway with RW. Pt has RW and SPC at home.  Pt lives alone and feels he can return home today. Pt's sister lives nearby and will be picking up pt for d/c.  Pt feels ready for d/c home today. See below for any follow-up Physical Therapy or equipment needs. PT is signing off. Thank you for this referral.      Recommendations for follow up therapy are one component of a multi-disciplinary discharge planning process, led by the attending physician.  Recommendations may be updated based on patient status, additional functional criteria and insurance authorization.  Follow Up Recommendations No PT follow up    Assistance Recommended at Discharge PRN  Patient can return home with the following       Equipment Recommendations None recommended by PT  Recommendations for Other Services       Functional Status Assessment Patient has not had a recent decline in their functional status     Precautions / Restrictions Precautions Precaution Comments: suprapubic catheter      Mobility  Bed Mobility Overal bed mobility: Modified Independent                   Transfers Overall transfer level: Needs assistance Equipment used: Rolling walker (2 wheels) Transfers: Sit to/from Stand Sit to Stand: Min guard, Supervision                Ambulation/Gait Ambulation/Gait assistance: Min guard, Supervision Gait Distance (Feet): 250 Feet Assistive device: Rolling walker (2 wheels) Gait Pattern/deviations: Step-through pattern, Decreased stride length, Wide base of support       General Gait Details: pt with increased toe out observed however pt reports gait pattern feels normal; pt typically uses SPC at home however utilized RW as pt has not been moving much since admission; pt also has RW at home  Stairs            Wheelchair Mobility    Modified Rankin (Stroke Patients Only)       Balance Overall balance assessment: Needs assistance         Standing balance support: No upper extremity supported Standing balance-Leahy Scale: Fair Standing balance comment: at least fair static balance                             Pertinent Vitals/Pain Pain Assessment Pain Assessment: No/denies pain    Home Living Family/patient expects to be discharged to:: Private residence Living Arrangements: Alone Available Help at Discharge: Family;Available PRN/intermittently (sister lives 5 minutes away) Type of Home: House Home Access: Ramped entrance       Home Layout: One level Home Equipment: Conservation officer, nature (2 wheels);Cane - single point  Prior Function Prior Level of Function : Independent/Modified Independent             Mobility Comments: typically uses SPC       Hand Dominance        Extremity/Trunk Assessment        Lower Extremity Assessment Lower Extremity Assessment: Generalized weakness    Cervical / Trunk Assessment Cervical / Trunk Assessment: Normal  Communication   Communication: No difficulties  Cognition Arousal/Alertness: Awake/alert Behavior During Therapy: WFL for tasks  assessed/performed Overall Cognitive Status: Within Functional Limits for tasks assessed                                          General Comments      Exercises     Assessment/Plan    PT Assessment Patient does not need any further PT services  PT Problem List         PT Treatment Interventions      PT Goals (Current goals can be found in the Care Plan section)  Acute Rehab PT Goals PT Goal Formulation: All assessment and education complete, DC therapy    Frequency       Co-evaluation               AM-PAC PT "6 Clicks" Mobility  Outcome Measure Help needed turning from your back to your side while in a flat bed without using bedrails?: None Help needed moving from lying on your back to sitting on the side of a flat bed without using bedrails?: None Help needed moving to and from a bed to a chair (including a wheelchair)?: None Help needed standing up from a chair using your arms (e.g., wheelchair or bedside chair)?: None Help needed to walk in hospital room?: A Little Help needed climbing 3-5 steps with a railing? : A Little 6 Click Score: 22    End of Session Equipment Utilized During Treatment: Gait belt Activity Tolerance: Patient tolerated treatment well Patient left: in chair;with call bell/phone within reach   PT Visit Diagnosis: Difficulty in walking, not elsewhere classified (R26.2)    Time: 4076-8088 PT Time Calculation (min) (ACUTE ONLY): 14 min   Charges:   PT Evaluation $PT Eval Low Complexity: 1 Low        Kati PT, DPT Acute Rehabilitation Services Pager: [phone] Office: [place] 02/13/2021, 12:06 PM

## 2021-02-13 NOTE — Progress Notes (Signed)
°  Transition of Care [facility]) Screening Note   Patient Details  Name: BRANDLEY ALDRETE Date of Birth: 11/09/1945   Transition of Care [facility]) CM/SW Contact:    Dessa Phi, RN Phone Number: 02/13/2021, 10:52 AM    Transition of Care Department [facility]) has reviewed patient and no TOC needs have been identified at this time. We will continue to monitor patient advancement through interdisciplinary progression rounds. If new patient transition needs arise, please place a TOC consult.

## 2021-02-13 NOTE — Care Management Obs Status (Signed)
[facility] NOTIFICATION   Patient Details  Name: Paul Bradley MRN: [id number] Date of Birth: 1945/05/01   Medicare Observation Status Notification Given:  Yes    MahabirJuliann Pulse, RN 02/13/2021, 12:19 PM

## 2021-02-13 NOTE — Plan of Care (Signed)

## 2021-02-13 NOTE — Discharge Summary (Signed)
Physician Discharge Summary  Paul Bradley YNW:[id number] DOB: [birth year] DOA: 02/11/2021  PCP: [name], MD  Admit date: 02/11/2021 Discharge date: 02/13/2021  Admitted From: Home Disposition: Home  Recommendations for Outpatient Follow-up:  Follow up with PCP in 1-2 weeks Please obtain BMP/CBC in one week Please follow up on the following pending results:  Home Health: N/A Equipment/Devices: N/A  Discharge Condition: Stable CODE STATUS: DNR Diet recommendation: Low-salt diet  Discharge summary: 76 year old gentleman with history of chronic A-fib on Eliquis, metastatic thyroid cancer on chemotherapy, chronic urinary retention with suprapubic catheter in place who was at urology office to change suprapubic catheter, he was sitting in chair and had dizziness, lethargy and less responsiveness.  He was noted to have blood pressure less than 90 so sent to ER.  Patient was not having any problem before going to urology office.  In the emergency room, systolic blood pressure 90, blood sugars adequate, admitted with near syncopal episode with abnormal urine from suprapubic catheter.  Acute UTI present on admission secondary to presence of indwelling suprapubic Foley catheter in place, chronic urinary retention. Blood cultures negative.  Urine culture with Morganella and Citrobacter. Suprapubic catheter was exchanged 2/1 in the emergency room. Clinically improved. Received Rocephin day 3 today.  Will discharge with 7 additional days of Omnicef.  Urology will keep up follow-up every 3 weeks. Hypotensive episodes with dehydration, will discontinue lisinopril.  His blood pressures are stable without lisinopril.  Resume all long-term medications including metoprolol, Eliquis, metformin, Synthroid.  Stable for discharge home today.     Discharge Diagnoses:  Principal Problem:   Acute lower UTI Active Problems:   Chronic a-fib on Eliquis   Diabetes mellitus without complication  (HCC)   BPH (benign prostatic hyperplasia)   Hypertension   Coronary artery disease   Suprapubic catheter (HCC)   Overweight (BMI 25.0-29.9)   Hypotension   Lactic acidosis    Discharge Instructions  Discharge Instructions     Call MD for:  persistant dizziness or light-headedness   Complete by: As directed    Call MD for:  temperature >100.4   Complete by: As directed    Diet - low sodium heart healthy   Complete by: As directed    Increase activity slowly   Complete by: As directed       Allergies as of 02/13/2021       Reactions   Penicillins Rash   Did it involve swelling of the face/tongue/throat, SOB, or low BP? Unknown Did it involve sudden or severe rash/hives, skin peeling, or any reaction on the inside of your mouth or nose? Unknown Did you need to seek medical attention at a hospital or doctor's office? Unknown When did it last happen?       If all above answers are NO, may proceed with cephalosporin use.        Medication List     STOP taking these medications    lisinopril 20 MG tablet Commonly known as: ZESTRIL       TAKE these medications    amLODipine 5 MG tablet Commonly known as: NORVASC Take 2.5-5 mg by mouth See admin instructions. 2.5mg  at bedtime during 2 weeks on Lenvima, 5mg  at bedtime during the week off of therapy.   apixaban 5 MG Tabs tablet Commonly known as: ELIQUIS Take 5 mg by mouth 2 (two) times daily.   atorvastatin 40 MG tablet Commonly known as: LIPITOR Take 40 mg by mouth at bedtime.   calcitRIOL 0.5  MCG capsule Commonly known as: ROCALTROL Take 0.5 mcg by mouth daily.   cefdinir 300 MG capsule Commonly known as: OMNICEF Take 1 capsule (300 mg total) by mouth 2 (two) times daily for 7 days.   Lenvima (10 MG Daily Dose) capsule Generic drug: lenvatinib 10 mg daily dose Take 10 mg by mouth See admin instructions. 10mg  daily for 2 weeks, off for 1 week, then repeat.   levothyroxine 150 MCG tablet Commonly  known as: SYNTHROID Take 150 mcg by mouth daily before breakfast.   methenamine 1 g tablet Commonly known as: HIPREX Take 1 g by mouth 2 (two) times daily.   metoprolol succinate 50 MG 24 hr tablet Commonly known as: TOPROL-XL Take 50 mg by mouth daily.   multivitamin tablet Take 1 tablet by mouth daily.   vitamin C 1000 MG tablet Take 1,000 mg by mouth daily.        Follow-up Information     [name], [name], MD Follow up in 1 week(s).   Specialty: Internal Medicine Contact information: [address] [place] [address] [phone]                Allergies  Allergen Reactions   Penicillins Rash    Did it involve swelling of the face/tongue/throat, SOB, or low BP? Unknown Did it involve sudden or severe rash/hives, skin peeling, or any reaction on the inside of your mouth or nose? Unknown Did you need to seek medical attention at a hospital or doctor's office? Unknown When did it last happen?       If all above answers are NO, may proceed with cephalosporin use.     Consultations: None   Procedures/Studies: No results found. (Echo, Carotid, EGD, Colonoscopy, ERCP)    Subjective: Patient seen and examined.  Denies any complaints.  Eager to go home.  Denies any suprapubic pain or discomfort.   Discharge Exam: Vitals:   02/13/21 0104 02/13/21 0432  BP: 119/81 126/70  Pulse: 78 (!) 55  Resp: 18 18  Temp: 98.2 F (36.8 C) 98 F (36.7 C)  SpO2: 99% 91%   Vitals:   02/12/21 1642 02/12/21 2104 02/13/21 0104 02/13/21 0432  BP: 116/69 104/67 119/81 126/70  Pulse: (!) 46 (!) 43 78 (!) 55  Resp: 20 18 18 18   Temp: 98.2 F (36.8 C) 98.3 F (36.8 C) 98.2 F (36.8 C) 98 F (36.7 C)  TempSrc: Oral     SpO2: 99% 98% 99% 91%  Weight:      Height:        General: Pt is alert, awake, not in acute distress Cardiovascular: RRR, S1/S2 +, no rubs, no gallops Respiratory: CTA bilaterally, no wheezing, no rhonchi Abdominal: Soft, NT,  ND, bowel sounds + Extremities: no edema, no cyanosis Suprapubic catheter in place.  Nontender.  Urine is clear.    The results of significant diagnostics from this hospitalization (including imaging, microbiology, ancillary and laboratory) are listed below for reference.     Microbiology: Recent Results (from the past 240 hour(s))  Urine Culture     Status: Abnormal   Collection Time: 02/11/21  1:05 PM   Specimen: Urine, Catheterized  Result Value Ref Range Status   Specimen Description   Final    URINE, CATHETERIZED Performed at [facility] [address], [place], [place] [address]    Special Requests   Final    NONE Performed at [facility], [place] [address] [place], [place] [address]  Culture (A)  Final    >=100,000 COLONIES/mL CITROBACTER FREUNDII 60,000 COLONIES/mL MORGANELLA MORGANII    Report Status 02/13/2021 FINAL  Final   Organism ID, Bacteria CITROBACTER FREUNDII (A)  Final   Organism ID, Bacteria MORGANELLA MORGANII (A)  Final      Susceptibility   Citrobacter freundii - MIC*    CEFAZOLIN >=64 RESISTANT Resistant     CEFEPIME <=0.12 SENSITIVE Sensitive     CEFTRIAXONE <=0.25 SENSITIVE Sensitive     CIPROFLOXACIN <=0.25 SENSITIVE Sensitive     GENTAMICIN <=1 SENSITIVE Sensitive     IMIPENEM <=0.25 SENSITIVE Sensitive     NITROFURANTOIN <=16 SENSITIVE Sensitive     TRIMETH/SULFA <=20 SENSITIVE Sensitive     PIP/TAZO <=4 SENSITIVE Sensitive     * >=100,000 COLONIES/mL CITROBACTER FREUNDII   Morganella morganii - MIC*    AMPICILLIN RESISTANT Resistant     CEFAZOLIN RESISTANT Resistant     CIPROFLOXACIN <=0.25 SENSITIVE Sensitive     GENTAMICIN <=1 SENSITIVE Sensitive     IMIPENEM 1 SENSITIVE Sensitive     NITROFURANTOIN RESISTANT Resistant     TRIMETH/SULFA <=20 SENSITIVE Sensitive     AMPICILLIN/SULBACTAM <=2 SENSITIVE Sensitive     PIP/TAZO <=4 SENSITIVE Sensitive     * 60,000 COLONIES/mL MORGANELLA MORGANII   Blood Culture (routine x 2)     Status: None (Preliminary result)   Collection Time: 02/11/21  3:34 PM   Specimen: BLOOD  Result Value Ref Range Status   Specimen Description   Final    BLOOD BLOOD RIGHT FOREARM Performed at [facility] [address], [place], [place] [address]    Special Requests   Final    BOTTLES DRAWN AEROBIC AND ANAEROBIC Blood Culture adequate volume Performed at [facility] [address] [place], [place] [address]    Culture   Final    NO GROWTH < 24 HOURS Performed at [facility] [address] [place], [place] [address]    Report Status PENDING  Incomplete  Blood Culture (routine x 2)     Status: None (Preliminary result)   Collection Time: 02/11/21  3:34 PM   Specimen: BLOOD  Result Value Ref Range Status   Specimen Description   Final    BLOOD BLOOD LEFT HAND Performed at [facility] [address], [place], [place] [address]    Special Requests   Final    BOTTLES DRAWN AEROBIC AND ANAEROBIC Blood Culture adequate volume Performed at [facility] [address], [place], [place] [address]    Culture   Final    NO GROWTH < 24 HOURS Performed at [facility] [address] [place],  [address]    Report Status PENDING  Incomplete  Resp Panel by RT-PCR (Flu A&B, Covid) Nasopharyngeal Swab     Status: None   Collection Time: 02/11/21  4:01 PM   Specimen: Nasopharyngeal Swab; Nasopharyngeal(NP) swabs in vial transport medium  Result Value Ref Range Status   SARS Coronavirus 2 by RT PCR NEGATIVE NEGATIVE Final    Comment: (NOTE) SARS-CoV-2 target nucleic acids are NOT DETECTED.  The SARS-CoV-2 RNA is generally detectable in upper respiratory specimens during the acute phase of infection. The lowest concentration of SARS-CoV-2 viral copies this assay can detect is 138 copies/mL. A negative result does not preclude SARS-Cov-2 infection and  should not be used as the sole basis for treatment or other patient management decisions. A negative result may  occur with  improper specimen collection/handling, submission of specimen other than nasopharyngeal swab, presence of viral mutation(s) within the areas targeted by this assay, and inadequate number of viral copies(<138 copies/mL). A negative result must be combined with clinical observations, patient history, and epidemiological information. The expected result is Negative.  Fact Sheet for Patients:  EntrepreneurPulse.com.au  Fact Sheet for Healthcare Providers:  IncredibleEmployment.be  This test is no t yet approved or cleared by the [place] FDA and  has been authorized for detection and/or diagnosis of SARS-CoV-2 by FDA under an Emergency Use Authorization (EUA). This EUA will remain  in effect (meaning this test can be used) for the duration of the COVID-19 declaration under Section 564(b)(1) of the Act, 21 U.S.C.section 360bbb-3(b)(1), unless the authorization is terminated  or revoked sooner.       Influenza A by PCR NEGATIVE NEGATIVE Final   Influenza B by PCR NEGATIVE NEGATIVE Final    Comment: (NOTE) The Xpert Xpress SARS-CoV-2/FLU/RSV plus assay is intended as an aid in the diagnosis of influenza from Nasopharyngeal swab specimens and should not be used as a sole basis for treatment. Nasal washings and aspirates are unacceptable for Xpert Xpress SARS-CoV-2/FLU/RSV testing.  Fact Sheet for Patients: EntrepreneurPulse.com.au  Fact Sheet for Healthcare Providers: IncredibleEmployment.be  This test is not yet approved or cleared by the [place] FDA and has been authorized for detection and/or diagnosis of SARS-CoV-2 by FDA under an Emergency Use Authorization (EUA). This EUA will remain in effect (meaning this test can be used) for the duration of the COVID-19 declaration  under Section 564(b)(1) of the Act, 21 U.S.C. section 360bbb-3(b)(1), unless the authorization is terminated or revoked.  Performed at [facility], [place] [address], [place], [place] [address]      Labs: BNP (last 3 results) No results for input(s): BNP in the last 8760 hours. Basic Metabolic Panel: Recent Labs  Lab 02/11/21 1020 02/12/21 0319  NA 131* 133*  K 4.0 3.9  CL 96* 101  CO2 28 25  GLUCOSE 130* 94  BUN 21 16  CREATININE 1.24 0.99  CALCIUM 8.1* 7.8*   Liver Function Tests: No results for input(s): AST, ALT, ALKPHOS, BILITOT, PROT, ALBUMIN in the last 168 hours. No results for input(s): LIPASE, AMYLASE in the last 168 hours. No results for input(s): AMMONIA in the last 168 hours. CBC: Recent Labs  Lab 02/11/21 1020 02/11/21 1534 02/12/21 0319  WBC 6.6 5.9 5.2  NEUTROABS  --  3.9  --   HGB 13.2 13.1 12.6*  HCT 39.2 39.0 37.4*  MCV 92.0 92.2 91.0  PLT 174 160 166   Cardiac Enzymes: No results for input(s): CKTOTAL, CKMB, CKMBINDEX, TROPONINI in the last 168 hours. BNP: Invalid input(s): POCBNP CBG: Recent Labs  Lab 02/12/21 1739 02/12/21 2103  GLUCAP 112* 102*   D-Dimer No results for input(s): DDIMER in the last 72 hours. Hgb A1c No results for input(s): HGBA1C in the last 72 hours. Lipid Profile No results for input(s): CHOL, HDL, LDLCALC, TRIG, CHOLHDL, LDLDIRECT in the last 72 hours. Thyroid function studies No results for input(s): TSH, T4TOTAL, T3FREE, THYROIDAB in the last 72 hours.  Invalid input(s): FREET3 Anemia work up No results for input(s): VITAMINB12, FOLATE, FERRITIN, TIBC, IRON, RETICCTPCT in the last 72 hours. Urinalysis    Component Value Date/Time   COLORURINE YELLOW 02/11/2021 1305   APPEARANCEUR HAZY (A) 02/11/2021 1305   LABSPEC 1.010 02/11/2021 1305   PHURINE 7.0 02/11/2021 1305   GLUCOSEU  NEGATIVE 02/11/2021 [place] 02/11/2021 1305   [place] 02/11/2021 1305    [place] 02/11/2021 1305   PROTEINUR 30 (A) 02/11/2021 1305   NITRITE POSITIVE (A) 02/11/2021 1305   LEUKOCYTESUR LARGE (A) 02/11/2021 1305   Sepsis Labs Invalid input(s): PROCALCITONIN,  WBC,  LACTICIDVEN Microbiology Recent Results (from the past 240 hour(s))  Urine Culture     Status: Abnormal   Collection Time: 02/11/21  1:05 PM   Specimen: Urine, Catheterized  Result Value Ref Range Status   Specimen Description   Final    URINE, CATHETERIZED Performed at [facility], [place] [address] [place], [place] [address]    Special Requests   Final    NONE Performed at [facility], [place] [address], [place], [place] [address]    Culture (A)  Final    >=100,000 COLONIES/mL CITROBACTER FREUNDII 60,000 COLONIES/mL MORGANELLA MORGANII    Report Status 02/13/2021 FINAL  Final   Organism ID, Bacteria CITROBACTER FREUNDII (A)  Final   Organism ID, Bacteria MORGANELLA MORGANII (A)  Final      Susceptibility   Citrobacter freundii - MIC*    CEFAZOLIN >=64 RESISTANT Resistant     CEFEPIME <=0.12 SENSITIVE Sensitive     CEFTRIAXONE <=0.25 SENSITIVE Sensitive     CIPROFLOXACIN <=0.25 SENSITIVE Sensitive     GENTAMICIN <=1 SENSITIVE Sensitive     IMIPENEM <=0.25 SENSITIVE Sensitive     NITROFURANTOIN <=16 SENSITIVE Sensitive     TRIMETH/SULFA <=20 SENSITIVE Sensitive     PIP/TAZO <=4 SENSITIVE Sensitive     * >=100,000 COLONIES/mL CITROBACTER FREUNDII   Morganella morganii - MIC*    AMPICILLIN RESISTANT Resistant     CEFAZOLIN RESISTANT Resistant     CIPROFLOXACIN <=0.25 SENSITIVE Sensitive     GENTAMICIN <=1 SENSITIVE Sensitive     IMIPENEM 1 SENSITIVE Sensitive     NITROFURANTOIN RESISTANT Resistant     TRIMETH/SULFA <=20 SENSITIVE Sensitive     AMPICILLIN/SULBACTAM <=2 SENSITIVE Sensitive     PIP/TAZO <=4 SENSITIVE Sensitive     * 60,000 COLONIES/mL MORGANELLA MORGANII  Blood Culture (routine x 2)     Status: None (Preliminary result)    Collection Time: 02/11/21  3:34 PM   Specimen: BLOOD  Result Value Ref Range Status   Specimen Description   Final    BLOOD BLOOD RIGHT FOREARM Performed at [facility] [address], [place], [place] [address]    Special Requests   Final    BOTTLES DRAWN AEROBIC AND ANAEROBIC Blood Culture adequate volume Performed at [facility] [address], [place], [place] [address]    Culture   Final    NO GROWTH < 24 HOURS Performed at [facility] [address] [place], [place] [address]    Report Status PENDING  Incomplete  Blood Culture (routine x 2)     Status: None (Preliminary result)   Collection Time: 02/11/21  3:34 PM   Specimen: BLOOD  Result Value Ref Range Status   Specimen Description   Final    BLOOD BLOOD LEFT HAND Performed at [facility] [address], [place], [place] [address]    Special Requests   Final    BOTTLES DRAWN AEROBIC AND ANAEROBIC Blood Culture adequate volume Performed at [facility] [address] [place], [place] [address]    Culture   Final    NO GROWTH < 24 HOURS Performed at  [facility], [place] [address] [place], [place] [address]    Report Status PENDING  Incomplete  Resp Panel by RT-PCR (Flu A&B, Covid) Nasopharyngeal Swab     Status: None   Collection Time: 02/11/21  4:01 PM   Specimen: Nasopharyngeal Swab; Nasopharyngeal(NP) swabs in vial transport medium  Result Value Ref Range Status   SARS Coronavirus 2 by RT PCR NEGATIVE NEGATIVE Final    Comment: (NOTE) SARS-CoV-2 target nucleic acids are NOT DETECTED.  The SARS-CoV-2 RNA is generally detectable in upper respiratory specimens during the acute phase of infection. The lowest concentration of SARS-CoV-2 viral copies this assay can detect is 138 copies/mL. A negative result does not preclude SARS-Cov-2 infection and should not be used as the sole basis for treatment or other patient  management decisions. A negative result may occur with  improper specimen collection/handling, submission of specimen other than nasopharyngeal swab, presence of viral mutation(s) within the areas targeted by this assay, and inadequate number of viral copies(<138 copies/mL). A negative result must be combined with clinical observations, patient history, and epidemiological information. The expected result is Negative.  Fact Sheet for Patients:  EntrepreneurPulse.com.au  Fact Sheet for Healthcare Providers:  IncredibleEmployment.be  This test is no t yet approved or cleared by the [place] FDA and  has been authorized for detection and/or diagnosis of SARS-CoV-2 by FDA under an Emergency Use Authorization (EUA). This EUA will remain  in effect (meaning this test can be used) for the duration of the COVID-19 declaration under Section 564(b)(1) of the Act, 21 U.S.C.section 360bbb-3(b)(1), unless the authorization is terminated  or revoked sooner.       Influenza A by PCR NEGATIVE NEGATIVE Final   Influenza B by PCR NEGATIVE NEGATIVE Final    Comment: (NOTE) The Xpert Xpress SARS-CoV-2/FLU/RSV plus assay is intended as an aid in the diagnosis of influenza from Nasopharyngeal swab specimens and should not be used as a sole basis for treatment. Nasal washings and aspirates are unacceptable for Xpert Xpress SARS-CoV-2/FLU/RSV testing.  Fact Sheet for Patients: EntrepreneurPulse.com.au  Fact Sheet for Healthcare Providers: IncredibleEmployment.be  This test is not yet approved or cleared by the [place] FDA and has been authorized for detection and/or diagnosis of SARS-CoV-2 by FDA under an Emergency Use Authorization (EUA). This EUA will remain in effect (meaning this test can be used) for the duration of the COVID-19 declaration under Section 564(b)(1) of the Act, 21 U.S.C. section 360bbb-3(b)(1),  unless the authorization is terminated or revoked.  Performed at [facility], [place] [address] [place], [place] [address]      Time coordinating discharge: 35 minutes  SIGNED:   Barb Merino, MD  [facility] 02/13/2021, 9:05 AM

## 2021-02-16 LAB — CULTURE, BLOOD (ROUTINE X 2)
Culture: NO GROWTH
Culture: NO GROWTH
Special Requests: ADEQUATE
Special Requests: ADEQUATE

## 2021-07-21 ENCOUNTER — Encounter (HOSPITAL_COMMUNITY): Payer: Self-pay

## 2021-07-21 ENCOUNTER — Other Ambulatory Visit: Payer: Self-pay

## 2021-07-21 ENCOUNTER — Emergency Department (HOSPITAL_COMMUNITY)
Admission: EM | Admit: 2021-07-21 | Discharge: 2021-07-21 | Disposition: A | Discharge status: Home / Self Care | Payer: Medicare HMO | Attending: Student | Admitting: Student

## 2021-07-21 DIAGNOSIS — I2510 Atherosclerotic heart disease of native coronary artery without angina pectoris: Secondary | ICD-10-CM | POA: Diagnosis not present

## 2021-07-21 DIAGNOSIS — Z85850 Personal history of malignant neoplasm of thyroid: Secondary | ICD-10-CM | POA: Insufficient documentation

## 2021-07-21 DIAGNOSIS — Z79899 Other long term (current) drug therapy: Secondary | ICD-10-CM | POA: Diagnosis not present

## 2021-07-21 DIAGNOSIS — T83098A Other mechanical complication of other indwelling urethral catheter, initial encounter: Secondary | ICD-10-CM | POA: Insufficient documentation

## 2021-07-21 DIAGNOSIS — Z7901 Long term (current) use of anticoagulants: Secondary | ICD-10-CM | POA: Diagnosis not present

## 2021-07-21 DIAGNOSIS — T83090A Other mechanical complication of cystostomy catheter, initial encounter: Secondary | ICD-10-CM

## 2021-07-21 DIAGNOSIS — I10 Essential (primary) hypertension: Secondary | ICD-10-CM | POA: Insufficient documentation

## 2021-07-21 DIAGNOSIS — E119 Type 2 diabetes mellitus without complications: Secondary | ICD-10-CM | POA: Diagnosis not present

## 2021-07-21 NOTE — ED Notes (Signed)
Pt provided discharge instructions and prescription information. Pt was given the opportunity to ask questions and questions were answered.   

## 2021-07-21 NOTE — ED Notes (Signed)
Assisted Dr. Matilde Sprang with replacing suprapubic cath with 20Fr Non-latex.

## 2021-07-21 NOTE — ED Triage Notes (Signed)
Pt reports inability to pass urine through suprapubic catheter, pt from home with sister, reports urine bag was emptied today at 4pm and was approx 1/2 full, pt has not passed urine since then. Pt reports feeling as if his bladder is full.

## 2021-07-22 NOTE — ED Provider Notes (Signed)
[facility] EMERGENCY DEPARTMENT Provider Note  CSN: [id number] Arrival date & time: 07/21/21 1802  Chief Complaint(s) suprapubic catheter problems  HPI Paul Bradley is a 76 y.o. male with PMH BPH, A-fib, T2DM, CAD, HTN who presents emergency department for evaluation of a blocked suprapubic catheter.  Patient states that he has been unable to pass urine through the suprapubic catheter over the last 24 hours.  Patient states that he feels a sensation of bladder fullness and lower abdominal discomfort.  Denies nausea, vomiting, fever, chest pain, shortness of breath or other systemic symptoms.   Past Medical History Past Medical History:  Diagnosis Date   Abscess of left hip    "healing wound" per [facility] Heme/Onc MD note 06/03/18   Assistance needed for mobility    walker   Atrial fibrillation ([place])    BPH (benign prostatic hyperplasia)    Cancer (HCC)    thyroid   Chronic anticoagulation    Coronary artery disease    Diabetes mellitus without complication ([place])    Hematochezia 05/05/2018   colonoscopy at [facility] with diverticulosis    Hypertension    Thyroid disease    Patient Active Problem List   Diagnosis Date Noted   UTI (urinary tract infection) 02/11/2021   Overweight (BMI 25.0-29.9) 02/11/2021   Hypotension 02/11/2021   Lactic acidosis 02/11/2021   Diabetes mellitus without complication (HCC)    BPH (benign prostatic hyperplasia)    Hypertension    Coronary artery disease    Suprapubic catheter ([place])    Acute lower UTI    GI bleed 06/08/2018   Diverticulosis 06/08/2018   Acute blood loss anemia---  Gi Bleed 06/08/2018   Chronic a-fib on Eliquis 06/08/2018   Anticoagulant long-term use 06/08/2018   Diarrhea    Generalized weakness 06/05/2018   Home Medication(s) Prior to Admission medications   Medication Sig Start Date End Date Taking? Authorizing Provider  amLODipine (NORVASC) 5 MG tablet Take 2.5-5 mg by mouth See admin instructions. 2.'5mg'$  at bedtime  during 2 weeks on Lenvima, '5mg'$  at bedtime during the week off of therapy. 12/27/20  Yes [provider]  apixaban (ELIQUIS) 5 MG TABS tablet Take 5 mg by mouth 2 (two) times daily.   Yes [provider]  Ascorbic Acid (VITAMIN C) 1000 MG tablet Take 1,000 mg by mouth daily.   Yes [provider]  atorvastatin (LIPITOR) 40 MG tablet Take 40 mg by mouth at bedtime. 01/14/21  Yes [provider]  calcitRIOL (ROCALTROL) 0.5 MCG capsule Take 0.5 mcg by mouth daily.   Yes [provider]  lenvatinib 10 mg daily dose (LENVIMA, 10 MG DAILY DOSE,) capsule Take 10 mg by mouth See admin instructions. '10mg'$  daily for 2 weeks, off for 1 week, then repeat. 01/02/21  Yes [provider]  levothyroxine (SYNTHROID) 150 MCG tablet Take by mouth. 04/22/21  Yes [provider]  lisinopril (ZESTRIL) 20 MG tablet Take 1 tablet by mouth daily. 04/28/21  Yes [provider]  methenamine (HIPREX) 1 g tablet Take 1 g by mouth 2 (two) times daily. 12/14/20  Yes [provider]  metoprolol succinate (TOPROL-XL) 50 MG 24 hr tablet Take 50 mg by mouth daily. 01/14/21  Yes [provider]  Multiple Vitamin (MULTIVITAMIN) tablet Take 1 tablet by mouth daily.   Yes [provider]  psyllium (METAMUCIL) 58.6 % packet Take 1 packet by mouth daily.   Yes [provider]  Past Surgical History Past Surgical History:  Procedure Laterality Date   THYROIDECTOMY     Family History Family History  Problem Relation Age of Onset   Prostate cancer Father    Colon cancer Neg Hx    Colon polyps Neg Hx     Social History Social History   Tobacco Use   Smoking status: Never   Smokeless tobacco: Never  Vaping Use   Vaping Use: Never used  Substance Use Topics   Alcohol use: Never   Drug use: Never    Allergies Penicillins  Review of Systems Review of Systems  Genitourinary:  Positive for difficulty urinating.    Physical Exam Vital Signs  I have reviewed the triage vital signs BP (!) 142/86   Pulse 76   Temp 97.9 F (36.6 C) (Oral)   Resp 20   Ht '6\' 1"'$  (1.854 m)   Wt 90.3 kg   SpO2 97%   BMI 26.25 kg/m    Physical Exam Constitutional:      General: He is not in acute distress.    Appearance: Normal appearance.  HENT:     Head: Normocephalic and atraumatic.     Nose: No congestion or rhinorrhea.  Eyes:     General:        Right eye: No discharge.        Left eye: No discharge.     Extraocular Movements: Extraocular movements intact.     Pupils: Pupils are equal, round, and reactive to light.  Cardiovascular:     Rate and Rhythm: Normal rate and regular rhythm.     Heart sounds: No murmur heard. Pulmonary:     Effort: No respiratory distress.     Breath sounds: No wheezing or rales.  Abdominal:     General: There is no distension.     Tenderness: There is no abdominal tenderness.  Musculoskeletal:        General: Normal range of motion.     Cervical back: Normal range of motion.  Skin:    General: Skin is warm and dry.  Neurological:     General: No focal deficit present.     Mental Status: He is alert.     ED Results and Treatments Labs (all labs ordered are listed, but only abnormal results are displayed) Labs Reviewed - No data to display                                                                                                                        Radiology No results found.  Pertinent labs & imaging results that were available during my care of the patient were reviewed by me and considered in my medical decision making (see MDM for details).  Medications Ordered in ED Medications - No data to display  Procedures SUPRAPUBIC TUBE PLACEMENT  Date/Time: 07/22/2021 11:41 AM  Performed by: Teressa Lower, MD Authorized by: Teressa Lower, MD   Consent:    Consent obtained:  Verbal   Consent given by:  Patient   Risks, benefits, and alternatives were discussed: yes     Risks discussed:  Bleeding, bowel perforation, infection and pain   Alternatives discussed:  No treatment Sedation:    Sedation type:  None Anesthesia:    Anesthesia method:  None Procedure details:    Complexity:  Simple   Catheter type:  Foley   Ultrasound guidance: no     Number of attempts:  1   Urine characteristics:  Clear Post-procedure details:    Procedure completion:  Tolerated well, no immediate complications   (including critical care time)  Medical Decision Making / ED Course   This patient presents to the ED for concern of blocked suprapubic catheter, this involves an extensive number of treatment options, and is a complaint that carries with it a high risk of complications and morbidity.  The differential diagnosis includes sediment buildup, urinary catheter dysfunction, kinked urinary catheter  MDM: Patient seen in the emergency department for evaluation of a blocked suprapubic catheter.  Physical exam is unremarkable with no evidence of erythema or purulence around the suprapubic catheter site.  Using sterile technique, patient suprapubic catheter was changed at bedside leading to appropriate flow of clear urine.  Patient then discharged with new Foley in place and outpatient follow-up.   Additional history obtained: -Additional history obtained from wife -External records from outside source obtained and reviewed including: Chart review including previous notes, labs, imaging, consultation notes  Medicines ordered and prescription drug management: No orders of the defined types were placed in this encounter.   -I have reviewed the patients home medicines and have made adjustments as  needed  Critical interventions none   Cardiac Monitoring: The patient was maintained on a cardiac monitor.  I personally viewed and interpreted the cardiac monitored which showed an underlying rhythm of: NSR  Social Determinants of Health:  Factors impacting patients care include: none   Reevaluation: After the interventions noted above, I reevaluated the patient and found that they have :improved  Co morbidities that complicate the patient evaluation  Past Medical History:  Diagnosis Date   Abscess of left hip    "healing wound" per [facility] Heme/Onc MD note 06/03/18   Assistance needed for mobility    walker   Atrial fibrillation ([place])    BPH (benign prostatic hyperplasia)    Cancer ([place])    thyroid   Chronic anticoagulation    Coronary artery disease    Diabetes mellitus without complication ([place])    Hematochezia 05/05/2018   colonoscopy at [facility] with diverticulosis    Hypertension    Thyroid disease       Dispostion: I considered admission for this patient, and with Foley catheter replaced, patient safe for discharge with outpatient follow-up.  Does not meet inpatient criteria for admission     Final Clinical Impression(s) / ED Diagnoses Final diagnoses:  Blocked suprapubic catheter, initial encounter [facility])     '@PCDICTATION'$ @    Teressa Lower, MD 07/22/21 1143

## 2021-08-27 DIAGNOSIS — I4811 Longstanding persistent atrial fibrillation: Secondary | ICD-10-CM | POA: Insufficient documentation

## 2021-08-30 ENCOUNTER — Inpatient Hospital Stay (HOSPITAL_COMMUNITY)
Admission: EM | Admit: 2021-08-30 | Discharge: 2021-09-09 | DRG: 377 | Disposition: A | Discharge status: Skilled Nursing Facility | Payer: Medicare HMO | Attending: Family Medicine | Admitting: Family Medicine

## 2021-08-30 ENCOUNTER — Encounter (HOSPITAL_COMMUNITY): Payer: Self-pay | Admitting: *Deleted

## 2021-08-30 ENCOUNTER — Other Ambulatory Visit: Payer: Self-pay

## 2021-08-30 ENCOUNTER — Emergency Department (HOSPITAL_COMMUNITY): Payer: Medicare HMO

## 2021-08-30 DIAGNOSIS — R195 Other fecal abnormalities: Secondary | ICD-10-CM | POA: Diagnosis not present

## 2021-08-30 DIAGNOSIS — Z66 Do not resuscitate: Secondary | ICD-10-CM | POA: Diagnosis present

## 2021-08-30 DIAGNOSIS — K921 Melena: Secondary | ICD-10-CM | POA: Diagnosis present

## 2021-08-30 DIAGNOSIS — D62 Acute posthemorrhagic anemia: Secondary | ICD-10-CM | POA: Diagnosis present

## 2021-08-30 DIAGNOSIS — E876 Hypokalemia: Secondary | ICD-10-CM | POA: Diagnosis not present

## 2021-08-30 DIAGNOSIS — Z8042 Family history of malignant neoplasm of prostate: Secondary | ICD-10-CM

## 2021-08-30 DIAGNOSIS — D638 Anemia in other chronic diseases classified elsewhere: Secondary | ICD-10-CM | POA: Diagnosis present

## 2021-08-30 DIAGNOSIS — I959 Hypotension, unspecified: Secondary | ICD-10-CM | POA: Diagnosis not present

## 2021-08-30 DIAGNOSIS — Z9359 Other cystostomy status: Secondary | ICD-10-CM

## 2021-08-30 DIAGNOSIS — D696 Thrombocytopenia, unspecified: Secondary | ICD-10-CM | POA: Diagnosis not present

## 2021-08-30 DIAGNOSIS — E860 Dehydration: Secondary | ICD-10-CM | POA: Diagnosis present

## 2021-08-30 DIAGNOSIS — K264 Chronic or unspecified duodenal ulcer with hemorrhage: Principal | ICD-10-CM | POA: Diagnosis present

## 2021-08-30 DIAGNOSIS — L89311 Pressure ulcer of right buttock, stage 1: Secondary | ICD-10-CM | POA: Diagnosis present

## 2021-08-30 DIAGNOSIS — E222 Syndrome of inappropriate secretion of antidiuretic hormone: Secondary | ICD-10-CM | POA: Diagnosis present

## 2021-08-30 DIAGNOSIS — Z79890 Hormone replacement therapy (postmenopausal): Secondary | ICD-10-CM

## 2021-08-30 DIAGNOSIS — M869 Osteomyelitis, unspecified: Secondary | ICD-10-CM | POA: Diagnosis present

## 2021-08-30 DIAGNOSIS — E871 Hypo-osmolality and hyponatremia: Secondary | ICD-10-CM | POA: Diagnosis not present

## 2021-08-30 DIAGNOSIS — D6832 Hemorrhagic disorder due to extrinsic circulating anticoagulants: Secondary | ICD-10-CM | POA: Diagnosis present

## 2021-08-30 DIAGNOSIS — E878 Other disorders of electrolyte and fluid balance, not elsewhere classified: Secondary | ICD-10-CM | POA: Diagnosis present

## 2021-08-30 DIAGNOSIS — E1169 Type 2 diabetes mellitus with other specified complication: Secondary | ICD-10-CM | POA: Diagnosis present

## 2021-08-30 DIAGNOSIS — I482 Chronic atrial fibrillation, unspecified: Secondary | ICD-10-CM | POA: Diagnosis present

## 2021-08-30 DIAGNOSIS — Z7901 Long term (current) use of anticoagulants: Secondary | ICD-10-CM

## 2021-08-30 DIAGNOSIS — R531 Weakness: Secondary | ICD-10-CM

## 2021-08-30 DIAGNOSIS — Z20822 Contact with and (suspected) exposure to covid-19: Secondary | ICD-10-CM | POA: Diagnosis present

## 2021-08-30 DIAGNOSIS — K222 Esophageal obstruction: Secondary | ICD-10-CM | POA: Diagnosis present

## 2021-08-30 DIAGNOSIS — C73 Malignant neoplasm of thyroid gland: Secondary | ICD-10-CM | POA: Diagnosis present

## 2021-08-30 DIAGNOSIS — E861 Hypovolemia: Secondary | ICD-10-CM | POA: Diagnosis present

## 2021-08-30 DIAGNOSIS — T83518A Infection and inflammatory reaction due to other urinary catheter, initial encounter: Secondary | ICD-10-CM | POA: Diagnosis present

## 2021-08-30 DIAGNOSIS — E890 Postprocedural hypothyroidism: Secondary | ICD-10-CM | POA: Diagnosis present

## 2021-08-30 DIAGNOSIS — L8990 Pressure ulcer of unspecified site, unspecified stage: Secondary | ICD-10-CM | POA: Diagnosis present

## 2021-08-30 DIAGNOSIS — R579 Shock, unspecified: Secondary | ICD-10-CM | POA: Diagnosis not present

## 2021-08-30 DIAGNOSIS — I2510 Atherosclerotic heart disease of native coronary artery without angina pectoris: Secondary | ICD-10-CM | POA: Diagnosis present

## 2021-08-30 DIAGNOSIS — K5730 Diverticulosis of large intestine without perforation or abscess without bleeding: Secondary | ICD-10-CM | POA: Diagnosis not present

## 2021-08-30 DIAGNOSIS — Z792 Long term (current) use of antibiotics: Secondary | ICD-10-CM

## 2021-08-30 DIAGNOSIS — L89152 Pressure ulcer of sacral region, stage 2: Secondary | ICD-10-CM | POA: Diagnosis present

## 2021-08-30 DIAGNOSIS — E119 Type 2 diabetes mellitus without complications: Secondary | ICD-10-CM

## 2021-08-30 DIAGNOSIS — Z7969 Long term (current) use of other immunomodulators and immunosuppressants: Secondary | ICD-10-CM

## 2021-08-30 DIAGNOSIS — N400 Enlarged prostate without lower urinary tract symptoms: Secondary | ICD-10-CM | POA: Diagnosis present

## 2021-08-30 DIAGNOSIS — T45515A Adverse effect of anticoagulants, initial encounter: Secondary | ICD-10-CM | POA: Diagnosis present

## 2021-08-30 DIAGNOSIS — K640 First degree hemorrhoids: Secondary | ICD-10-CM | POA: Diagnosis present

## 2021-08-30 DIAGNOSIS — I10 Essential (primary) hypertension: Secondary | ICD-10-CM | POA: Diagnosis present

## 2021-08-30 DIAGNOSIS — K922 Gastrointestinal hemorrhage, unspecified: Secondary | ICD-10-CM | POA: Diagnosis not present

## 2021-08-30 DIAGNOSIS — R112 Nausea with vomiting, unspecified: Secondary | ICD-10-CM | POA: Diagnosis not present

## 2021-08-30 DIAGNOSIS — T368X5A Adverse effect of other systemic antibiotics, initial encounter: Secondary | ICD-10-CM | POA: Diagnosis present

## 2021-08-30 DIAGNOSIS — R571 Hypovolemic shock: Secondary | ICD-10-CM | POA: Diagnosis present

## 2021-08-30 DIAGNOSIS — Z79899 Other long term (current) drug therapy: Secondary | ICD-10-CM

## 2021-08-30 DIAGNOSIS — R1310 Dysphagia, unspecified: Secondary | ICD-10-CM | POA: Diagnosis not present

## 2021-08-30 DIAGNOSIS — D649 Anemia, unspecified: Secondary | ICD-10-CM | POA: Diagnosis not present

## 2021-08-30 DIAGNOSIS — Z880 Allergy status to penicillin: Secondary | ICD-10-CM

## 2021-08-30 DIAGNOSIS — Y731 Therapeutic (nonsurgical) and rehabilitative gastroenterology and urology devices associated with adverse incidents: Secondary | ICD-10-CM | POA: Diagnosis present

## 2021-08-30 DIAGNOSIS — L89321 Pressure ulcer of left buttock, stage 1: Secondary | ICD-10-CM | POA: Diagnosis present

## 2021-08-30 LAB — URINALYSIS, ROUTINE W REFLEX MICROSCOPIC
Bilirubin Urine: NEGATIVE
Glucose, UA: NEGATIVE mg/dL
Ketones, ur: NEGATIVE mg/dL
Nitrite: NEGATIVE
Protein, ur: 100 mg/dL — AB
Specific Gravity, Urine: 1.016 (ref 1.005–1.030)
WBC, UA: >50 WBC/hpf — ABNORMAL HIGH (ref 0–5)
pH: 6 (ref 5.0–8.0)

## 2021-08-30 LAB — CBC WITH DIFFERENTIAL/PLATELET
Abs Immature Granulocytes: 0.02 10*3/uL (ref 0.00–0.07)
Basophils Absolute: 0 10*3/uL (ref 0.0–0.1)
Basophils Relative: 0 %
Eosinophils Absolute: 0 10*3/uL (ref 0.0–0.5)
Eosinophils Relative: 0 %
HCT: 25.4 % — ABNORMAL LOW (ref 39.0–52.0)
Hemoglobin: 8.7 g/dL — ABNORMAL LOW (ref 13.0–17.0)
Immature Granulocytes: 0 %
Lymphocytes Relative: 16 %
Lymphs Abs: 1.1 10*3/uL (ref 0.7–4.0)
MCH: 31.2 pg (ref 26.0–34.0)
MCHC: 34.3 g/dL (ref 30.0–36.0)
MCV: 91 fL (ref 80.0–100.0)
Monocytes Absolute: 0.7 10*3/uL (ref 0.1–1.0)
Monocytes Relative: 10 %
Neutro Abs: 5.2 10*3/uL (ref 1.7–7.7)
Neutrophils Relative %: 74 %
Platelets: 105 10*3/uL — ABNORMAL LOW (ref 150–400)
RBC: 2.79 MIL/uL — ABNORMAL LOW (ref 4.22–5.81)
RDW: 13.7 % (ref 11.5–15.5)
WBC: 7.1 10*3/uL (ref 4.0–10.5)
nRBC: 0 % (ref 0.0–0.2)

## 2021-08-30 LAB — PREPARE RBC (CROSSMATCH)

## 2021-08-30 LAB — PROTIME-INR
INR: 1.9 — ABNORMAL HIGH (ref 0.8–1.2)
Prothrombin Time: 21.2 seconds — ABNORMAL HIGH (ref 11.4–15.2)

## 2021-08-30 LAB — COMPREHENSIVE METABOLIC PANEL
ALT: 32 U/L (ref 0–44)
AST: 26 U/L (ref 15–41)
Albumin: 2.8 g/dL — ABNORMAL LOW (ref 3.5–5.0)
Alkaline Phosphatase: 44 U/L (ref 38–126)
Anion gap: 12 (ref 5–15)
BUN: 21 mg/dL (ref 8–23)
CO2: 23 mmol/L (ref 22–32)
Calcium: 6 mg/dL — CL (ref 8.9–10.3)
Chloride: 87 mmol/L — ABNORMAL LOW (ref 98–111)
Creatinine, Ser: 1.22 mg/dL (ref 0.61–1.24)
GFR, Estimated: >60 mL/min (ref >60)
Glucose, Bld: 141 mg/dL — ABNORMAL HIGH (ref 70–99)
Potassium: 4.1 mmol/L (ref 3.5–5.1)
Sodium: 122 mmol/L — ABNORMAL LOW (ref 135–145)
Total Bilirubin: 0.7 mg/dL (ref 0.3–1.2)
Total Protein: 5.6 g/dL — ABNORMAL LOW (ref 6.5–8.1)

## 2021-08-30 LAB — C-REACTIVE PROTEIN: CRP: 3.9 mg/dL — ABNORMAL HIGH (ref <1.0)

## 2021-08-30 LAB — GLUCOSE, CAPILLARY
Glucose-Capillary: 136 mg/dL — ABNORMAL HIGH (ref 70–99)
Glucose-Capillary: 137 mg/dL — ABNORMAL HIGH (ref 70–99)
Glucose-Capillary: 140 mg/dL — ABNORMAL HIGH (ref 70–99)

## 2021-08-30 LAB — LACTIC ACID, PLASMA
Lactic Acid, Venous: 3.3 mmol/L (ref 0.5–1.9)
Lactic Acid, Venous: 2.2 mmol/L (ref 0.5–1.9)
Lactic Acid, Venous: 2.7 mmol/L (ref 0.5–1.9)

## 2021-08-30 LAB — ABO/RH: ABO/RH(D): A POS

## 2021-08-30 LAB — CBC
HCT: 25.8 % — ABNORMAL LOW (ref 39.0–52.0)
Hemoglobin: 8.9 g/dL — ABNORMAL LOW (ref 13.0–17.0)
MCH: 31.9 pg (ref 26.0–34.0)
MCHC: 34.5 g/dL (ref 30.0–36.0)
MCV: 92.5 fL (ref 80.0–100.0)
Platelets: 103 10*3/uL — ABNORMAL LOW (ref 150–400)
RBC: 2.79 MIL/uL — ABNORMAL LOW (ref 4.22–5.81)
RDW: 13.9 % (ref 11.5–15.5)
WBC: 7.3 10*3/uL (ref 4.0–10.5)
nRBC: 0 % (ref 0.0–0.2)

## 2021-08-30 LAB — MRSA NEXT GEN BY PCR, NASAL: MRSA by PCR Next Gen: NOT DETECTED

## 2021-08-30 LAB — SARS CORONAVIRUS 2 BY RT PCR: SARS Coronavirus 2 by RT PCR: NEGATIVE

## 2021-08-30 LAB — PROCALCITONIN: Procalcitonin: 0.17 ng/mL

## 2021-08-30 LAB — APTT: aPTT: 34 seconds (ref 24–36)

## 2021-08-30 LAB — POC OCCULT BLOOD, ED: Fecal Occult Bld: POSITIVE — AB

## 2021-08-30 MED ORDER — GUAIFENESIN-DM 100-10 MG/5ML PO SYRP
5.0000 mL | ORAL_SOLUTION | ORAL | Status: DC | PRN
  Administered 2021-08-30 – 2021-09-07 (×7): 5 mL via ORAL
  Filled 2021-08-30 (×7): qty 5

## 2021-08-30 MED ORDER — MORPHINE SULFATE (PF) 4 MG/ML IV SOLN: 4.0000 mg | INTRAVENOUS | Status: DC | PRN

## 2021-08-30 MED ORDER — ONDANSETRON HCL 4 MG PO TABS: 8.0000 mg | ORAL_TABLET | Freq: Four times a day (QID) | ORAL | Status: DC | PRN

## 2021-08-30 MED ORDER — MIDODRINE HCL 5 MG PO TABS
10.0000 mg | ORAL_TABLET | Freq: Three times a day (TID) | ORAL | Status: DC
  Administered 2021-08-30 – 2021-09-09 (×19): 10 mg via ORAL
  Filled 2021-08-30 (×19): qty 2

## 2021-08-30 MED ORDER — BISACODYL 10 MG RE SUPP: 10.0000 mg | Freq: Every day | RECTAL | Status: DC | PRN

## 2021-08-30 MED ORDER — SODIUM CHLORIDE 0.9 % IV SOLN
8.0000 mg | Freq: Four times a day (QID) | INTRAVENOUS | Status: DC | PRN
  Administered 2021-08-31: 8 mg via INTRAVENOUS
  Filled 2021-08-30: qty 4

## 2021-08-30 MED ORDER — SODIUM CHLORIDE 0.9 % IV BOLUS
1000.0000 mL | Freq: Once | INTRAVENOUS | Status: AC
Start: 2021-08-30 — End: 2021-08-30
  Administered 2021-08-30: 1000 mL via INTRAVENOUS

## 2021-08-30 MED ORDER — SODIUM CHLORIDE 0.9 % IV SOLN: INTRAVENOUS | Status: DC

## 2021-08-30 MED ORDER — SODIUM CHLORIDE 0.9 % IV BOLUS
500.0000 mL | Freq: Once | INTRAVENOUS | Status: AC
  Administered 2021-08-30: 500 mL via INTRAVENOUS

## 2021-08-30 MED ORDER — INSULIN ASPART 100 UNIT/ML IJ SOLN
0.0000 [IU] | Freq: Three times a day (TID) | INTRAMUSCULAR | Status: DC
  Administered 2021-08-30 – 2021-09-06 (×6): 2 [IU] via SUBCUTANEOUS
  Administered 2021-09-08: 3 [IU] via SUBCUTANEOUS
  Administered 2021-09-09: 2 [IU] via SUBCUTANEOUS

## 2021-08-30 MED ORDER — SODIUM CHLORIDE 0.9 % IV SOLN
10.0000 mL/h | Freq: Once | INTRAVENOUS | Status: AC
  Administered 2021-08-30: 10 mL/h via INTRAVENOUS

## 2021-08-30 MED ORDER — MORPHINE SULFATE (PF) 2 MG/ML IV SOLN: 2.0000 mg | INTRAVENOUS | Status: DC | PRN

## 2021-08-30 MED ORDER — MIDODRINE HCL 5 MG PO TABS
10.0000 mg | ORAL_TABLET | Freq: Three times a day (TID) | ORAL | Status: DC
  Filled 2021-08-30: qty 2

## 2021-08-30 MED ORDER — SODIUM CHLORIDE 0.9% FLUSH
3.0000 mL | Freq: Two times a day (BID) | INTRAVENOUS | Status: DC
  Administered 2021-08-30 – 2021-09-09 (×20): 3 mL via INTRAVENOUS

## 2021-08-30 MED ORDER — HYDROXYZINE HCL 10 MG PO TABS
10.0000 mg | ORAL_TABLET | Freq: Once | ORAL | Status: AC | PRN
  Administered 2021-08-30: 10 mg via ORAL
  Filled 2021-08-30: qty 1

## 2021-08-30 MED ORDER — HEPARIN (PORCINE) 25000 UT/250ML-% IV SOLN: 10.0000 [IU]/kg/h | INTRAVENOUS | Status: DC

## 2021-08-30 MED ORDER — SODIUM CHLORIDE 0.9 % IV BOLUS
1000.0000 mL | Freq: Once | INTRAVENOUS | Status: AC
  Administered 2021-08-30: 1000 mL via INTRAVENOUS

## 2021-08-30 MED ORDER — KETOROLAC TROMETHAMINE 15 MG/ML IJ SOLN: 15.0000 mg | Freq: Four times a day (QID) | INTRAMUSCULAR | Status: DC | PRN

## 2021-08-30 NOTE — Progress Notes (Signed)
@  2048 Dr. Clearence Ped, on-call for attending, paged regarding pt's SBPs 70s-90s. Page promptly returned and order received for PO midodrine. Consequently Dr Evangeline Gula placed order for bolus. Orders clarified with both MDs and both interventions implemented. Will continue to monitor.

## 2021-08-30 NOTE — ED Triage Notes (Signed)
Pt brought in by [facility] for c/o hypotensive and near syncopal episode  Systolic in the 39'J with ems  Pt lives alone and pt called sister and told her he did not feel well

## 2021-08-30 NOTE — Progress Notes (Signed)
Dr. Clearence Ped notified of Critical Lactic Acid of 2.7 up from 2.2 along with repeat CBC post transfusion (Hgb 8.9 from 8.7). MD endorsed to transfuse 2nd unit. Will administer and continue to monitor.

## 2021-08-30 NOTE — Progress Notes (Signed)
Pt's BP normalized. Dr. Clearence Ped notified and clarification sought on follow-up labs between units of blood. CBC ordered for now. Will obtain and notify MD of results.

## 2021-08-30 NOTE — ED Notes (Signed)
4 oz of Kefir probiotic milk given at this time.

## 2021-08-30 NOTE — H&P (Addendum)
History and Physical    [name]:[id number] DOB: Jan 19, 1945 DOA: 08/30/2021  PCP: [name], MD  Patient coming from: Home  I have personally briefly reviewed patient's old medical records in [place]  Chief Complaint: Weakness and low blood pressure  HPI: Paul Bradley is a 76 y.o. 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 twice daily, diabetes mellitus type 2, hypertension, chronic atrial fibrillation on Eliquis who presented today via EMS 3 days after being discharged from [facility] with concerns for weakness.  Patient has multiple medical issues but recently his biggest issue has been the osteomyelitis of his second toe.  He has been on linezolid and since he started that medication has had terrible stomach problems.  His appetite has been very poor and his p.o. intake has been very poor.  Notably he was not taking any probiotics but was eating yogurt.  He has become dehydrated several times since starting this medication.  EMS today felt the patient was weaker than usual even after his recent hospitalization as they are familiar with him.  Is a poor historian and other than neck pain has no acute complaints however his blood pressure was markedly low in the emergency department and he was started on several boluses of IV fluid and placed in Trendelenburg due to hypotension.  Family members report that the patient is weaker than usual today.  Patient was tachycardic with a heart rate in the 140s and blood pressures in the 70s when he was initially picked up by EMS.   ED Course: Multiple liter boluses of normal saline with resultant blood pressure in the 80s."  Hemoccult in the emergency department found to be positive with a hemoglobin of 8.4 which is substantially a drop from February where it  was approximately 12.  Sodium noted to be markedly low at 122.  1 unit of packed red blood cells was ordered.and pt referred to me for evaluation and admission.  Review of care everywhere shows hemoglobin on 8/15 was 10.3 it was 12.3 on 8/7 and was 9.2 on 8/16.  Review of Systems: As per HPI otherwise all other systems reviewed and  negative.   Past Medical History:  Diagnosis Date   Abscess of left hip    "healing wound" per [facility] Heme/Onc MD note 06/03/18   Assistance needed for mobility    walker   Atrial fibrillation ([place])    BPH (benign prostatic hyperplasia)    Cancer ([place])    thyroid   Chronic anticoagulation    Coronary artery disease    Diabetes mellitus without complication ([place])    Hematochezia 05/05/2018   colonoscopy at [facility] with diverticulosis    Hypertension    Thyroid disease     Past Surgical History:  Procedure Laterality Date   THYROIDECTOMY      Social History   Social History Narrative   SISTER HELPS HIM CARE FOR HIMSELF. NO KIDS. WORKED AT [employer] TIME UNTIL LAST [place] 2020.     reports that he has never smoked. He has never used smokeless tobacco. He reports that he does not drink alcohol and does not use drugs.  Allergies  Allergen Reactions   Penicillins Rash    Did it involve swelling of the face/tongue/throat, SOB, or low BP? Unknown Did it involve sudden or  severe rash/hives, skin peeling, or any reaction on the inside of your mouth or nose? Unknown Did you need to seek medical attention at a hospital or doctor's office? Unknown When did it last happen?       If all above answers are "NO", may proceed with cephalosporin use.     Family History  Problem Relation Age of Onset   Prostate cancer Father    Colon cancer Neg Hx    Colon polyps Neg Hx      Prior to Admission medications   Medication Sig Start Date End Date Taking? Authorizing Provider  amLODipine (NORVASC) 5 MG tablet Take 2.5-5 mg by mouth See admin  instructions. 2.'5mg'$  at bedtime during 2 weeks on Lenvima, '5mg'$  at bedtime during the week off of therapy. 12/27/20   [provider]  apixaban (ELIQUIS) 5 MG TABS tablet Take 5 mg by mouth 2 (two) times daily.    [provider]  Ascorbic Acid (VITAMIN C) 1000 MG tablet Take 1,000 mg by mouth daily.    [provider]  atorvastatin (LIPITOR) 40 MG tablet Take 40 mg by mouth at bedtime. 01/14/21   [provider]  calcitRIOL (ROCALTROL) 0.5 MCG capsule Take 0.5 mcg by mouth daily.    [provider]  lenvatinib 10 mg daily dose (LENVIMA, 10 MG DAILY DOSE,) capsule Take 10 mg by mouth See admin instructions. '10mg'$  daily for 2 weeks, off for 1 week, then repeat. 01/02/21   [provider]  levothyroxine (SYNTHROID) 150 MCG tablet Take by mouth. 04/22/21   [provider]  lisinopril (ZESTRIL) 20 MG tablet Take 1 tablet by mouth daily. 04/28/21   [provider]  methenamine (HIPREX) 1 g tablet Take 1 g by mouth 2 (two) times daily. 12/14/20   [provider]  metoprolol succinate (TOPROL-XL) 50 MG 24 hr tablet Take 50 mg by mouth daily. 01/14/21   [provider]  Multiple Vitamin (MULTIVITAMIN) tablet Take 1 tablet by mouth daily.    [provider]  psyllium (METAMUCIL) 58.6 % packet Take 1 packet by mouth daily.    [provider]    Physical Exam:  Constitutional: NAD, calm, comfortable Vitals:   08/30/21 2030 08/30/21 2032 08/30/21 2040 08/30/21 2045  BP: (!) 70/40 (!) 75/43 (!) 90/38 (!) 89/39  Pulse: (!) 110 (!) 57 60 68  Resp: (!) '23 20 15 19  '$ Temp:    98.1 F (36.7 C)  TempSrc:    Axillary  SpO2: 97% 96% 96% 99%   Eyes: PERRL, lids and conjunctivae normal ENMT: Mucous membranes are dry. Posterior pharynx clear of any exudate or lesions.Normal dentition.  Neck: normal, supple, no masses, no thyromegaly Respiratory: clear to auscultation bilaterally, no wheezing, no crackles. Normal  respiratory effort. No accessory muscle use.  Cardiovascular: Rtachy rate and rhythm, no murmurs / rubs / gallops. No extremity edema. 2+ pedal pulses. No carotid bruits.  Abdomen: no tenderness, no masses palpated. No hepatosplenomegaly. Bowel sounds positive.  Musculoskeletal: no clubbing / cyanosis. No joint deformity upper and lower extremities. Good ROM, no contractures. Normal muscle tone.  Skin: no rashes, lesions, ulcers. No induration Neurologic: CN 2-12 grossly intact. Sensation intact, DTR normal. Strength 5/5 in all 4.  Psychiatric: confused but at baseline Alert and oriented x 2-3. Normal mood.    Labs on Admission: I have personally reviewed following labs and imaging studies  CBC: Recent Labs  Lab 08/30/21 1338  WBC 7.1  NEUTROABS 5.2  HGB  8.7*  HCT 25.4*  MCV 91.0  PLT 093*   Basic Metabolic Panel: Recent Labs  Lab 08/30/21 1338  NA 122*  K 4.1  CL 87*  CO2 23  GLUCOSE 141*  BUN 21  CREATININE 1.22  CALCIUM 6.0*   GFR: CrCl cannot be calculated (Unknown ideal weight.). Liver Function Tests: Recent Labs  Lab 08/30/21 1338  AST 26  ALT 32  ALKPHOS 44  BILITOT 0.7  PROT 5.6*  ALBUMIN 2.8*   Coagulation Profile: Recent Labs  Lab 08/30/21 1338  INR 1.9*    CBG: Recent Labs  Lab 08/30/21 1829 08/30/21 1940  GLUCAP 137* 140*   Urine analysis:    Component Value Date/Time   COLORURINE AMBER (A) 08/30/2021 1338   APPEARANCEUR CLOUDY (A) 08/30/2021 1338   LABSPEC 1.016 08/30/2021 1338   PHURINE 6.0 08/30/2021 1338   GLUCOSEU NEGATIVE 08/30/2021 1338   HGBUR SMALL (A) 08/30/2021 1338   BILIRUBINUR NEGATIVE 08/30/2021 1338   [place] 08/30/2021 1338   PROTEINUR 100 (A) 08/30/2021 1338   NITRITE NEGATIVE 08/30/2021 1338   LEUKOCYTESUR LARGE (A) 08/30/2021 1338    Radiological Exams on Admission: DG Chest Port 1 View  Result Date: 08/30/2021 CLINICAL DATA:  Questionable sepsis EXAM: PORTABLE CHEST 1 VIEW COMPARISON:   06/05/2018 FINDINGS: No focal consolidation. Trace bilateral pleural effusions. No pneumothorax. Stable cardiomegaly. No acute osseous abnormality. IMPRESSION: 1. Trace bilateral pleural effusions. Electronically Signed   By: Kathreen Devoid M.D.   On: 08/30/2021 14:36    EKG: Independently reviewed. A fib at 58 BPM  Assessment/Plan Principal Problem:   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HCC) Active Problems:   Nausea and vomiting   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 (HCC)   Blood in stool   Thyroid cancer ([place])   Diabetes mellitus without complication (HCC)   Chronic a-fib on Eliquis   Hypertension   Pressure injury of skin   Suprapubic catheter ([place])    1.  Vascular shock: Patient with very low blood pressures likely due to both low blood volume as well as nausea and vomiting associated with antibiotics from his osteomyelitis.  Patient has had 2 admissions to the hospital for feeling weak.  Pressures tonight were very low and he has required several boluses as well as a unit of packed red blood cells to get his blood pressures up.  Patient will be admitted to the intensive care unit.  We will continue to monitor blood pressures very closely.  He has had sufficient fluid volume resuscitation if blood pressures remain low he may require pressors.  2.  Nausea and vomiting associated with antibiotics for osteomyelitis of second toe of left foot: Patient is taking linezolid 600 p.o. every 12 hours.  This medication often makes people nauseous unable to hold down food.  He is doing somewhat better in the emergency department after some Zofran.  I have requested that family obtain Cepheid KE FIR for him and to get him 4 ounces twice daily to provide him with some natural probiotics to try to repopulate his gut so he can tolerate the antibiotics.  3.  Blood in stool: We will monitor hemoglobin and transfuse as necessary patient may benefit from evaluation by GI.  However he is on anticoagulation and this would need  to be held.  4.  Thyroid cancer: Treatments currently on hold as patient was not having great benefit from it.  5.  Diabetes mellitus without complication: Blood glucoses are currently stable.  We will continue  to monitor with sliding scale insulin coverage and diabetes protocol.  6.  Chronic A-fib on Eliquis: Heart rate is relatively low.  Rec is not completed but appears patient takes Toprol XL 50 daily.  We will hold this medication for now.  We will also hold Eliquis and monitor hemoglobins.  7.  Hypertension: We will hold lisinopril and monitor blood pressures closely.    8.  Incomplete database: Medication reconciliation has not yet been completed.  Awaiting completion prior to ordering home medications that are not essential.    9.  Pressure injury of skin: Management per nursing protocol.  10.  Suprapubic catheter: Noted  DVT prophylaxis: SCDs Code Status: DO NOT RESUSCITATE Family Communication: Spoke with patient's sister who was present at the time of admission Disposition Plan: Unclear at this point Consults called: None yet Admission status: Inpatient   Lady Deutscher MD [facility] Pager [phone]  How to contact the [facility] Attending or Consulting provider [place] or covering provider during after hours [place], for this patient?  Check the care team in [facility] and look for a) attending/consulting [facility] provider listed and b) the [facility] team listed Log into [email or web address] and use 's universal password to access. If you do not have the password, please contact the hospital operator. Locate the [facility] provider you are looking for under [facility] and page to a number that you can be directly reached. If you still have difficulty reaching the provider, please page the [facility] (Director on Call) for the Hospitalists listed on amion for assistance.  If 7PM-7AM, please contact night-coverage [email or web address] Password [facility]  08/30/2021, 8:55 PM

## 2021-08-30 NOTE — ED Provider Notes (Signed)
[facility] EMERGENCY DEPARTMENT Provider Note   CSN: [id number] Arrival date & time: 08/30/21  1332     History  Chief Complaint  Patient presents with   Hypotension    LAMORRIS KNOBLOCK is a 76 y.o. male.  HPI Patient presents via EMS [facility] now with concern for weakness.  Patient has multiple medical issues, and details are obtained by the patient, and EMS providers as well as chart review. Per EMS the patient was weaker than usual today after recent hospitalization.  Patient is a poor historian, but denies pain other than in his neck, seemingly from positioning in transport when he was placed in Trendelenburg due to hypotension.  EMS reports the family members found the patient weaker than usual today, prompting transport.  EMS notes the patient was tachycardic with a heart rate in the 140s, hypotensive with a systolic pressure in the 03T, prompting evaluation. Medical problems include thyroid cancer, progressive, A-fib on Eliquis, multiple toe amputations, and suprapubic catheter.    Home Medications Prior to Admission medications   Medication Sig Start Date End Date Taking? Authorizing Provider  amLODipine (NORVASC) 5 MG tablet Take 2.5-5 mg by mouth See admin instructions. 2.'5mg'$  at bedtime during 2 weeks on Lenvima, '5mg'$  at bedtime during the week off of therapy. 12/27/20   [provider]  apixaban (ELIQUIS) 5 MG TABS tablet Take 5 mg by mouth 2 (two) times daily.    [provider]  Ascorbic Acid (VITAMIN C) 1000 MG tablet Take 1,000 mg by mouth daily.    [provider]  atorvastatin (LIPITOR) 40 MG tablet Take 40 mg by mouth at bedtime. 01/14/21   [provider]  calcitRIOL (ROCALTROL) 0.5 MCG capsule Take 0.5 mcg by mouth daily.    [provider]  lenvatinib 10 mg daily dose (LENVIMA, 10 MG DAILY DOSE,) capsule Take 10 mg by mouth See admin instructions. '10mg'$  daily for 2 weeks, off for 1 week, then  repeat. 01/02/21   [provider]  levothyroxine (SYNTHROID) 150 MCG tablet Take by mouth. 04/22/21   [provider]  lisinopril (ZESTRIL) 20 MG tablet Take 1 tablet by mouth daily. 04/28/21   [provider]  methenamine (HIPREX) 1 g tablet Take 1 g by mouth 2 (two) times daily. 12/14/20   [provider]  metoprolol succinate (TOPROL-XL) 50 MG 24 hr tablet Take 50 mg by mouth daily. 01/14/21   [provider]  Multiple Vitamin (MULTIVITAMIN) tablet Take 1 tablet by mouth daily.    [provider]  psyllium (METAMUCIL) 58.6 % packet Take 1 packet by mouth daily.    [provider]      Allergies    Penicillins    Review of Systems   Review of Systems  All other systems reviewed and are negative.   Physical Exam Updated Vital Signs BP (!) 91/55   Pulse (!) 58   Temp 98 F (36.7 C) (Rectal)   Resp (!) 25   SpO2 100%  Physical Exam Vitals and nursing note reviewed.  Constitutional:      Appearance: He is well-developed. He is ill-appearing.  HENT:     Head: Normocephalic and atraumatic.  Eyes:     Conjunctiva/sclera: Conjunctivae normal.  Cardiovascular:     Rate and Rhythm: Regular rhythm. Tachycardia present.  Pulmonary:     Effort: Pulmonary effort is normal. No respiratory distress.     Breath sounds: No stridor.  Abdominal:  General: There is no distension.     Tenderness: There is no abdominal tenderness. There is no guarding.  Genitourinary:    Rectum: Guaiac result positive.  Musculoskeletal:     Comments: Partial amputation distal left foot multiple toes unremarkable  Skin:    General: Skin is warm and dry.     Coloration: Skin is pale.  Neurological:     Mental Status: He is alert.     Motor: Atrophy present.  Psychiatric:        Behavior: Behavior is slowed.     ED Results / Procedures / Treatments   Labs (all labs ordered are listed, but only abnormal results are displayed) Labs  Reviewed  LACTIC ACID, PLASMA - Abnormal; Notable for the following components:      Result Value   Lactic Acid, Venous 3.3 (*)    All other components within normal limits  COMPREHENSIVE METABOLIC PANEL - Abnormal; Notable for the following components:   Sodium 122 (*)    Chloride 87 (*)    Glucose, Bld 141 (*)    Calcium 6.0 (*)    Total Protein 5.6 (*)    Albumin 2.8 (*)    All other components within normal limits  CBC WITH DIFFERENTIAL/PLATELET - Abnormal; Notable for the following components:   RBC 2.79 (*)    Hemoglobin 8.7 (*)    HCT 25.4 (*)    Platelets 105 (*)    All other components within normal limits  PROTIME-INR - Abnormal; Notable for the following components:   Prothrombin Time 21.2 (*)    INR 1.9 (*)    All other components within normal limits  POC OCCULT BLOOD, ED - Abnormal; Notable for the following components:   Fecal Occult Bld POSITIVE (*)    All other components within normal limits  CULTURE, BLOOD (ROUTINE X 2)  CULTURE, BLOOD (ROUTINE X 2)  URINE CULTURE  APTT  LACTIC ACID, PLASMA  URINALYSIS, ROUTINE W REFLEX MICROSCOPIC  TYPE AND SCREEN  PREPARE RBC (CROSSMATCH)  ABO/RH    EKG EKG Interpretation  Date/Time:  Saturday August 30 2021 13:59:27 EDT Ventricular Rate:  58 PR Interval:    QRS Duration: 96 QT Interval:  515 QTC Calculation: 506 R Axis:   62 Text Interpretation: Atrial fibrillation Low voltage, extremity leads T wave abnormality Abnormal ECG Confirmed by Carmin Muskrat [phone]) on 08/30/2021 2:27:01 PM  Radiology DG Chest Port 1 View  Result Date: 08/30/2021 CLINICAL DATA:  Questionable sepsis EXAM: PORTABLE CHEST 1 VIEW COMPARISON:  06/05/2018 FINDINGS: No focal consolidation. Trace bilateral pleural effusions. No pneumothorax. Stable cardiomegaly. No acute osseous abnormality. IMPRESSION: 1. Trace bilateral pleural effusions. Electronically Signed   By: Kathreen Devoid M.D.   On: 08/30/2021 14:36    Procedures Procedures     Medications Ordered in ED Medications  0.9 %  sodium chloride infusion (has no administration in time range)  sodium chloride 0.9 % bolus 1,000 mL (1,000 mLs Intravenous New Bag/Given 08/30/21 1407)    ED Course/ Medical Decision Making/ A&P This patient with a Hx of multiple medical issues including thyroid cancer, A-fib, recent hospitalization presents to the ED for concern of this, hypotension, this involves an extensive number of treatment options, and is a complaint that carries with it a high risk of complications and morbidity.    The differential diagnosis includes GI bleed, progression of disease, sepsis   Social Determinants of Health:  Age, cancer  Additional history obtained:  Additional history and/or information obtained  from as above EMS, chart review, notable for transport details included above.  On chart review summary of oncology is provided below:  History of Present Illness: Mr. Burhan Barham is a 76 y.o. male with recurrent papillary thyroid carcinoma & PMHx of HTN, T2DM & chronic atrial fibrillation on anticoagulation. He underwent a selective neck dissection on 12/20/2012 by Dr. Vicie Mutters. On 03/10/17, his thyroglobulin was 2.0 and TSH was 0.201. Unfortunately, a CT chest 09/20/2017 showed worsening mediastinal/thoracic adenopathy and numerous bilateral pulmonary nodules consistent with disease progression. CT neck/thyroid 09/20/2017 also showed progression of thyroid cancer within bilateral neck. Therefore, he was referred to [facility] medical oncology for further evaluation on 10/28/2017. He underwent an RAI scan 11/29/2017 that was negative. He then underwent a biopsy of a right supraclavicular node on 01/18/2018 that confirmed metastatic papillary throid carcinoma. It was recommended that Mr Debold start systemic therapy with Lenvatinib daily. He was consented for this on 01/25/2018.  He started Lenvatinib 14 mg daily on 02/12/2018. On 03/22/2018, he presented to clinic with  hyponatremia & other electrolyte disturbances; he was admitted & Lenvatinib was HELD. He was discharged to a nursing facility on 03/25/2018 & instructed to continue holding Lenvatinib. He has subsequently restarted lenvatanib at a reduced dose of 10 mg daily days 1-14 of a 21 day cycle. He completed 10 fxs RT to mediastinal mass on 06/16/2019; he was recently hospitalized for a toe osteomyelitis and calf cellulitis s/p amputation. Treated with broad spectrum abx which was completed on 08/08/2020. He remained OFF lenvatinib during this time (since 06/25/2020) to allow for further healing post-surgery. He restarted Lenvatinib in October 2022 after scans in September showed progression of disease.  Current Status: I had the pleasure of seeing Mr. Sahaj Bona Barkan in clinic today for continued follow-up. He is accompanied by his sister. He has been taking Lenvatinib 10 mg (2 weeks on, 1 week off). He recently had another toe amputated, and he has been taking linezolid as prescribed by ID. For the last 2 weeks, he has noted nausea with intermittent vomiting, which has prevented him from eating much. Overall, he has not felt well. He denies diarrhea, constipation, abdominal pain, chest pain, shortness of breath, productive cough, headaches, or peripheral edema.     After the initial evaluation, orders, including: Labs monitoring x-ray fluids were initiated.   Patient placed on Cardiac and Pulse-Oximetry Monitors. The patient was maintained on a cardiac monitor.  The cardiac monitored showed an rhythm of A-fib 60s abnormal The patient was also maintained on pulse oximetry. The readings were typically 99% room air normal   On repeat evaluation of the patient improved Patient's blood pressure improved after fluids, even before initiation of blood and full resuscitation.  Where the patient's map on arrival was close to 50, with fluid resuscitation map has increased past 65.  Lab Tests:  I personally interpreted  labs.  The pertinent results include: Lactic acidosis, hypocalcemia, hyponatremia all unremarkable.  Equally notable as the patient's hemoglobin value 8.5, down from 12.5 last year.  Imaging Studies ordered:  I independently visualized and interpreted imaging which showed trace bilateral effusion on x-ray I agree with the radiologist interpretation  Consultations Obtained:  I requested consultation with the gastroenterology,  and discussed lab and imaging findings as well as pertinent plan - they recommend: Patient of Eliquis, transfusion, availability for consult  Dispostion / Final MDM:  After consideration of the diagnostic results and the patient's response to treatment, this adult male with multiple medical  issues most notably thyroid cancer not currently getting therapy due to multiple recent illnesses, A-fib on Eliquis, ongoing antibiotics for recent amputation of toe, now presents with weakness, is found to be initially hypotensive.  EMS reports tachycardia, though this was not substantiated.  Patient does have a history of A-fib and with his Eliquis use there is some suspicion for GI bleed which is substantiated with Hemoccult positive status, and drop in hemoglobin substantially from most recent labs here.  Patient is, however, mentating appropriately.  In addition to the patient's critically abnormal sodium value, and hypocalcemia, with concern for GI bleed patient had initiation of transfusion was admitted for monitoring, management.  Though the patient meets multiple SIRS criteria, he is afebrile, no early evidence for pneumonia, cellulitis, or infectious contributory to his presentation.  Patient's progression of malignancy may be contributory, as he has not been on chemotherapy for several months according to his sister.  Final Clinical Impression(s) / ED Diagnoses Final diagnoses:  Gastrointestinal hemorrhage, unspecified gastrointestinal hemorrhage type  Weakness  Hyponatremia   Hypocalcemia  CRITICAL CARE Performed by: Carmin Muskrat Total critical care time45 min  Critical care time was exclusive of separately billable procedures and treating other patients. Critical care was necessary to treat or prevent imminent or life-threatening deterioration. Critical care was time spent personally by me on the following activities: development of treatment plan with patient and/or surrogate as well as nursing, discussions with consultants, evaluation of patient's response to treatment, examination of patient, obtaining history from patient or surrogate, ordering and performing treatments and interventions, ordering and review of laboratory studies, ordering and review of radiographic studies, pulse oximetry and re-evaluation of patient's condition.    Carmin Muskrat, MD 08/30/21 1535

## 2021-08-31 ENCOUNTER — Inpatient Hospital Stay (HOSPITAL_COMMUNITY): Payer: Medicare HMO

## 2021-08-31 DIAGNOSIS — E119 Type 2 diabetes mellitus without complications: Secondary | ICD-10-CM

## 2021-08-31 DIAGNOSIS — R112 Nausea with vomiting, unspecified: Secondary | ICD-10-CM | POA: Diagnosis not present

## 2021-08-31 DIAGNOSIS — K921 Melena: Secondary | ICD-10-CM

## 2021-08-31 DIAGNOSIS — M869 Osteomyelitis, unspecified: Secondary | ICD-10-CM

## 2021-08-31 DIAGNOSIS — D649 Anemia, unspecified: Secondary | ICD-10-CM | POA: Diagnosis not present

## 2021-08-31 DIAGNOSIS — R579 Shock, unspecified: Secondary | ICD-10-CM | POA: Diagnosis not present

## 2021-08-31 DIAGNOSIS — I482 Chronic atrial fibrillation, unspecified: Secondary | ICD-10-CM | POA: Diagnosis not present

## 2021-08-31 DIAGNOSIS — K922 Gastrointestinal hemorrhage, unspecified: Secondary | ICD-10-CM | POA: Diagnosis not present

## 2021-08-31 DIAGNOSIS — E871 Hypo-osmolality and hyponatremia: Secondary | ICD-10-CM

## 2021-08-31 DIAGNOSIS — E878 Other disorders of electrolyte and fluid balance, not elsewhere classified: Secondary | ICD-10-CM | POA: Diagnosis present

## 2021-08-31 DIAGNOSIS — C73 Malignant neoplasm of thyroid gland: Secondary | ICD-10-CM

## 2021-08-31 DIAGNOSIS — L89152 Pressure ulcer of sacral region, stage 2: Secondary | ICD-10-CM

## 2021-08-31 DIAGNOSIS — I959 Hypotension, unspecified: Secondary | ICD-10-CM | POA: Diagnosis not present

## 2021-08-31 LAB — CBC
HCT: 26.1 % — ABNORMAL LOW (ref 39.0–52.0)
Hemoglobin: 9 g/dL — ABNORMAL LOW (ref 13.0–17.0)
MCH: 31.4 pg (ref 26.0–34.0)
MCHC: 34.5 g/dL (ref 30.0–36.0)
MCV: 90.9 fL (ref 80.0–100.0)
Platelets: 103 10*3/uL — ABNORMAL LOW (ref 150–400)
RBC: 2.87 MIL/uL — ABNORMAL LOW (ref 4.22–5.81)
RDW: 13.9 % (ref 11.5–15.5)
WBC: 5.7 10*3/uL (ref 4.0–10.5)
nRBC: 0.3 % — ABNORMAL HIGH (ref 0.0–0.2)

## 2021-08-31 LAB — COMPREHENSIVE METABOLIC PANEL
ALT: 29 U/L (ref 0–44)
AST: 21 U/L (ref 15–41)
Albumin: 2.7 g/dL — ABNORMAL LOW (ref 3.5–5.0)
Alkaline Phosphatase: 39 U/L (ref 38–126)
Anion gap: 8 (ref 5–15)
BUN: 22 mg/dL (ref 8–23)
CO2: 22 mmol/L (ref 22–32)
Calcium: 5.6 mg/dL — CL (ref 8.9–10.3)
Chloride: 95 mmol/L — ABNORMAL LOW (ref 98–111)
Creatinine, Ser: 1.17 mg/dL (ref 0.61–1.24)
GFR, Estimated: >60 mL/min (ref >60)
Glucose, Bld: 115 mg/dL — ABNORMAL HIGH (ref 70–99)
Potassium: 4.6 mmol/L (ref 3.5–5.1)
Sodium: 125 mmol/L — ABNORMAL LOW (ref 135–145)
Total Bilirubin: 1.2 mg/dL (ref 0.3–1.2)
Total Protein: 5.4 g/dL — ABNORMAL LOW (ref 6.5–8.1)

## 2021-08-31 LAB — BPAM RBC
Blood Product Expiration Date: 202309132359
Blood Product Expiration Date: 202309192359
ISSUE DATE / TIME: 202308191832
ISSUE DATE / TIME: 202308192342
Unit Type and Rh: 6200
Unit Type and Rh: 6200

## 2021-08-31 LAB — TYPE AND SCREEN
ABO/RH(D): A POS
Antibody Screen: NEGATIVE
Unit division: 0
Unit division: 0

## 2021-08-31 LAB — GLUCOSE, CAPILLARY
Glucose-Capillary: 123 mg/dL — ABNORMAL HIGH (ref 70–99)
Glucose-Capillary: 107 mg/dL — ABNORMAL HIGH (ref 70–99)
Glucose-Capillary: 146 mg/dL — ABNORMAL HIGH (ref 70–99)
Glucose-Capillary: 118 mg/dL — ABNORMAL HIGH (ref 70–99)
Glucose-Capillary: 128 mg/dL — ABNORMAL HIGH (ref 70–99)

## 2021-08-31 LAB — APTT: aPTT: 39 seconds — ABNORMAL HIGH (ref 24–36)

## 2021-08-31 LAB — HEMOGLOBIN A1C
Hgb A1c MFr Bld: 6.1 % — ABNORMAL HIGH (ref 4.8–5.6)
Mean Plasma Glucose: 128.37 mg/dL

## 2021-08-31 LAB — LACTIC ACID, PLASMA: Lactic Acid, Venous: 1.9 mmol/L (ref 0.5–1.9)

## 2021-08-31 MED ORDER — ONDANSETRON HCL 4 MG/2ML IJ SOLN
4.0000 mg | Freq: Two times a day (BID) | INTRAMUSCULAR | Status: DC
  Administered 2021-08-31 – 2021-09-09 (×16): 4 mg via INTRAVENOUS
  Filled 2021-08-31 (×17): qty 2

## 2021-08-31 MED ORDER — PANTOPRAZOLE SODIUM 40 MG IV SOLR
40.0000 mg | Freq: Two times a day (BID) | INTRAVENOUS | Status: DC
  Administered 2021-08-31 – 2021-09-05 (×11): 40 mg via INTRAVENOUS
  Filled 2021-08-31 (×11): qty 10

## 2021-08-31 MED ORDER — ACETAMINOPHEN 325 MG PO TABS: 650.0000 mg | ORAL_TABLET | Freq: Four times a day (QID) | ORAL | Status: DC | PRN

## 2021-08-31 MED ORDER — IPRATROPIUM-ALBUTEROL 0.5-2.5 (3) MG/3ML IN SOLN
3.0000 mL | Freq: Four times a day (QID) | RESPIRATORY_TRACT | Status: DC | PRN
  Administered 2021-09-08: 3 mL via RESPIRATORY_TRACT
  Filled 2021-08-31: qty 3

## 2021-08-31 MED ORDER — CHLORHEXIDINE GLUCONATE CLOTH 2 % EX PADS
6.0000 | MEDICATED_PAD | Freq: Every day | CUTANEOUS | Status: DC
  Administered 2021-08-31 – 2021-09-09 (×10): 6 via TOPICAL

## 2021-08-31 MED ORDER — CALCIUM GLUCONATE-NACL 1-0.675 GM/50ML-% IV SOLN
1.0000 g | Freq: Once | INTRAVENOUS | Status: AC
  Administered 2021-08-31: 1000 mg via INTRAVENOUS
  Filled 2021-08-31: qty 50

## 2021-08-31 MED ORDER — FUROSEMIDE 10 MG/ML IJ SOLN
20.0000 mg | Freq: Once | INTRAMUSCULAR | Status: AC
Start: 2021-08-31 — End: 2021-08-31
  Administered 2021-08-31: 20 mg via INTRAVENOUS
  Filled 2021-08-31: qty 2

## 2021-08-31 MED ORDER — ORAL CARE MOUTH RINSE: 15.0000 mL | OROMUCOSAL | Status: DC | PRN

## 2021-08-31 MED ORDER — PANTOPRAZOLE SODIUM 40 MG PO TBEC
40.0000 mg | DELAYED_RELEASE_TABLET | Freq: Every day | ORAL | Status: DC
  Administered 2021-08-31: 40 mg via ORAL
  Filled 2021-08-31: qty 1

## 2021-08-31 MED ORDER — MORPHINE SULFATE (PF) 2 MG/ML IV SOLN: 2.0000 mg | INTRAVENOUS | Status: DC | PRN

## 2021-08-31 NOTE — TOC Progression Note (Signed)
  Transition of Care [facility]) Screening Note   Patient Details  Name: Paul Bradley Date of Birth: [birth year]   Transition of Care [facility]) CM/SW Contact:    Boneta Lucks, RN Phone Number: 08/31/2021, 1:03 PM    Transition of Care Department [facility]) has reviewed patient and no TOC needs have been identified at this time. We will continue to monitor patient advancement through interdisciplinary progression rounds. If new patient transition needs arise, please place a TOC consult.        Barriers to Discharge: Continued Medical Work up  Expected Discharge Plan and Services       Living arrangements for the past 2 months: [place]

## 2021-08-31 NOTE — Assessment & Plan Note (Addendum)
-  Patient admitted with hypotension -Continue holding antihypertensive agents at this point -Follow vital signs. -Continue to maintain Adequate hydration. -Patient is not requiring pressor supports and is hemodynamically stable to be transferred to telemetry bed.

## 2021-08-31 NOTE — Assessment & Plan Note (Signed)
-  With concern for lower GI bleed -Continue PPI -GI service consulted -Continue to follow hemoglobin trend and transfuse as needed -Continue to hold anticoagulation therapy -Currently hemodynamically stable. -Hemoglobin 8.6 -GI service has allow full liquid diet and is planning for endoscopy and colonoscopy once electrolytes are further stabilized.

## 2021-08-31 NOTE — Assessment & Plan Note (Addendum)
-  Chronic; due to to be exchange on 09/02/2021 -Will touch base with urology service.

## 2021-08-31 NOTE — Progress Notes (Addendum)
Progress Note   Patient: Paul Bradley:[id number] DOB: [birth year] DOA: 08/30/2021     1 DOS: the patient was seen and examined on 08/31/2021   Brief hospital admission course: As per H&P written by Dr. Evangeline Gula on 08/30/21 Paul Bradley is a 76 y.o. 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 twice daily, diabetes mellitus type 2, hypertension, chronic atrial fibrillation on Eliquis who presented today via EMS 3 days after being discharged from [facility] with concerns for weakness.  Patient has multiple medical issues but recently his biggest issue has been the osteomyelitis of his second toe.  He has been on linezolid and since he started that medication has had terrible stomach problems.  His appetite has been very poor and his p.o. intake has been very poor.  Notably he was not taking any probiotics but was eating yogurt.  He has become dehydrated several times since starting this medication.  EMS today felt the patient was weaker than usual even after his recent hospitalization as they are familiar with him.  Is a poor historian and other than neck pain has no acute complaints however his blood pressure was markedly low in the emergency department and he was started on several boluses of IV fluid and placed in Trendelenburg due to hypotension.  Family members report that the patient is weaker than usual today.  Patient was tachycardic with a heart rate in the 140s and blood pressures in the 70s when he was initially picked up by EMS.     ED Course: Multiple liter boluses of normal saline with resultant blood pressure in the 80s."  Hemoccult in the emergency department found to be positive with a hemoglobin of 8.4 which is substantially a drop from February where it was approximately 12.  Sodium noted to be markedly low at 122.   1 unit of packed red blood cells was ordered.and pt referred to me for evaluation and admission.  Review of care everywhere shows hemoglobin on 8/15 was 10.3 it was 12.3 on 8/7 and was 9.2 on 8/16.  Assessment and Plan: * 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[place]) - In the setting of hypovolemia from dehydration and concern for blood loss -Hemoglobin dropped to 8.7 from 12.6 in about 72-month-Excellent response to fluid resuscitation and blood transfusion. -Vital signs currently stable. -No need for pressors. -Follow blood pressure trend. -Continue minimizing antihypertensive agents.  Nausea and vomiting - Appears to be associated with the use of antibiotics -Will hold for the next 24 hours -Continue antiemetics.  Blood in stool - With concern for lower GI bleed -Continue PPI -GI service consulted -Follow hemoglobin trend and transfuse as needed -Hold anticoagulation therapy -Currently hemodynamically stable.  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 ([place] - Status post amputation -Has been on suppressive antibiotic therapy -will request records to find out plan and length of therapy -no active drainage, healing appropriately.    Thyroid cancer ([place] -Continue outpatient follow-up with oncology service -Chemotherapy agents was on hold per most recent records.  Diabetes mellitus without complication (HCC) - Continue sliding scale insulin -Follow CBGs fluctuation and adjust hypoglycemic regimen as needed -A1c 6.1  Chronic a-fib on Eliquis - Holding rate control agents in the setting of hypovolemic shock and low blood pressure. -Holding anticoagulation due to positive blood in stools and decreasing hemoglobin level.  Hypertension -  Patient admitted with hypotension -Holding antihypertensive agents at this point -Follow vital signs. -Maintain Adequate hydration.  Pressure injury of skin - Continue constant repositioning and wound care recommendations. -No signs of superimposed infection;  pressure injuries were present at time of admission. -Stage I left buttocks and stage II medial aspect coccyx area.  Suprapubic catheter ([place]) - Chronic; due to to be exchange on 09/02/2021 -Will touch base with urology service.  Abnormal blood electrolyte level - Hyponatremia and hypothalassemia appreciated -There has been concern for underlying SIADH most likely worsened by GI losses and dehydration at this time. -Continue fluid resuscitation, provide calcium gluconate and follow electrolytes trend -Continue telemetry monitoring. -Will resume sodium tablets if electrolyte failed to improved with hydration.   Subjective:  Afebrile, reports feeling slightly better; no chest pain, no nausea or vomiting.  Slightly short of breath and demonstrating intermittent coughing spells.  No requiring oxygen supplementation.  Blood pressure has stabilized.  Physical Exam: Vitals:   08/31/21 0422 08/31/21 0900 08/31/21 1000 08/31/21 1100  BP:  (!) 145/58 (!) 126/56 (!) 133/59  Pulse: 63 71 70 72  Resp: '14 15 18 15  '$ Temp:      TempSrc:      SpO2: 96% 96% 97% 96%  Weight:      Height:       General exam: Alert, awake, oriented x 3; demonstrating intermittent coughing spells, short of breath/short winded with activity.  No requiring oxygen supplementation. Respiratory system: Positive Rales on exam; fine crackles at the bases.  No wheezing. Cardiovascular system: Rate controlled, no rubs, no gallops, no JVD. Gastrointestinal system: Abdomen is nondistended, soft and nontender. No organomegaly or masses felt. Normal bowel sounds heard. Central nervous system: Alert and oriented. No focal neurological deficits. Extremities: No cyanosis or clubbing. Skin: No petechiae.  Stage I left buttocks pressure injury present at time of admission without signs of superimposed infection.  Stage II medial coccyx pressure injury appreciated at time of admission without signs of infection.  Suprapubic catheter in  place. Psychiatry: Judgement and insight appear normal. Mood & affect appropriate.   Data Reviewed: A1c 6.1 Comprehensive metabolic panel: Sodium 009, potassium 4.6, chloride 95, bicarb 22, BUN 22, creatinine 1.17, calcium 5.6; normal LFTs. CBC BBC 5.7, hemoglobin 11.0, platelet count 103K   Family Communication: No family at bedside.  Disposition: Status is: Inpatient Remains inpatient appropriate because: Follow-up with lower GI bleed and hypovolemic shock.  Multiple electrolytes abnormality; will require endoscopic evaluation once stable.   Planned Discharge Destination: Home; most likely with home health services.  Author: Barton Dubois, MD 08/31/2021 11:46 AM  For on call review [email or web address].

## 2021-08-31 NOTE — Assessment & Plan Note (Signed)
-  Continue outpatient follow-up with oncology service -Chemotherapy agents was on hold per most recent records.

## 2021-08-31 NOTE — Assessment & Plan Note (Signed)
-   Continue constant repositioning and wound care recommendations. -No signs of superimposed infection; pressure injuries were present at time of admission. -Stage I left buttocks and stage II medial aspect coccyx area.

## 2021-08-31 NOTE — Assessment & Plan Note (Signed)
-   Continue sliding scale insulin -Follow CBGs fluctuation and adjust hypoglycemic regimen as needed -A1c 6.1

## 2021-08-31 NOTE — Assessment & Plan Note (Signed)
-  Status post amputation, less first 2 toes bilaterally; has healed adequately and there is no signs of infection. -Has been on suppressive antibiotic therapy; the plan was to stop antibiotics on 09/09/2021; reporting the presence of anemia and thrombocytopenia will hold any further treatment with Zyvox at this point. -Patient remains afebrile with normal WBC.

## 2021-08-31 NOTE — Assessment & Plan Note (Signed)
-   Appears to be associated with the use of antibiotics -Will hold for the next 24 hours -Continue antiemetics.

## 2021-08-31 NOTE — Consult Note (Addendum)
Paul Bradley, M.D. Gastroenterology & Hepatology                                           Patient Name: Paul Bradley Account #: $[id number] @   MRN: [id number] Admission Date: 08/30/2021 Date of Evaluation:  08/31/2021 Time of Evaluation: 8:59 AM   Referring Physician: Barton Dubois, MD  Chief Complaint: Positive Hemoccult stool and anemia  HPI:  This is a 76 y.o. male with history of BPH, papillary thyroid cancer, A-fib on Eliquis, diabetes, coronary artery disease, osteomyelitis, hypertension, who came to the hospital after presenting worsening lightheadedness and fatigue.  Patient is not a good historian.  Patient was recently hospitalized at [place] for episodes of osteomyelitis for which he was started on linezolid and ciprofloxacin.  Per medical chart this caused significant decrease in his oral intake and persistent nausea.  This has been concerning as he had presented hyponatremia due to this that has been evaluated as outpatient.  Gastroenterology was consulted as he was found to have positive Hemoccult in the ER and he had a drop in his hemoglobin.  Patient denies having any melena or hematochezia but states that his stools have been darker recently (states that they have been dark brown).  Last time he took his Eliquis was yesterday morning.  He denies taking any NSAIDs.  States that he had a colonoscopy in 2020 which showed diverticulosis (it was performed at [facility]).  He endorsed having some worsening fatigue for the last week and some associated shortness of breath and chest pain.  Per nursing staff he had very scant amount of fresh blood when he was cleaned yesterday but no overt melena or hematochezia.  In the ED, he presented significant hypotension with blood pressure of 73/44 and low heart rate of 48.  He was afebrile.  He was given IV fluids and was put in Trendelenburg position.  He was found to IV fluid administration.  Labs were remarkable for severe  hyponatremia 122 calcium of 6.0, chloride of 87, rest of electrolytes within normal limits, creatinine was normal 1.22, albumin was low at 2.8, rest of liver panel was within normal limits, lactic acid was markedly increased 3.3 which improved down to 1.9 today.  CBC showed hemoglobin of 8.7 (baseline from February was 12.6), MCV was 91, platelets 105 and white blood cell count of 7.1.  He was given 2 units of PRBC yesterday but his hemoglobin remained in a similar value of 9.0.  INR was 1.9.  Chest x-ray showed trace bilateral pleural effusions.  Patient was transferred to ICU for further management.   Past Medical History: SEE CHRONIC ISSSUES: Past Medical History:  Diagnosis Date   Abscess of left hip    "healing wound" per [facility] Heme/Onc MD note 06/03/18   Assistance needed for mobility    walker   Atrial fibrillation ([place])    BPH (benign prostatic hyperplasia)    Cancer ([place])    thyroid   Chronic anticoagulation    Coronary artery disease    Diabetes mellitus without complication ([place])    Hematochezia 05/05/2018   colonoscopy at [facility] with diverticulosis    Hypertension    Thyroid disease    Past Surgical History:  Past Surgical History:  Procedure Laterality Date   THYROIDECTOMY     Family History:  Family History  Problem Relation Age of Onset  Prostate cancer Father    Colon cancer Neg Hx    Colon polyps Neg Hx    Social History:  Social History   Tobacco Use   Smoking status: Never   Smokeless tobacco: Never  Vaping Use   Vaping Use: Never used  Substance Use Topics   Alcohol use: Never   Drug use: Never    Home Medications:  Prior to Admission medications   Medication Sig Start Date End Date Taking? Authorizing Provider  amLODipine (NORVASC) 5 MG tablet Take 2.5-5 mg by mouth See admin instructions. 2.5mg  at bedtime during 2 weeks on Lenvima, 5mg  at bedtime during the week off of therapy. 12/27/20   [provider]  apixaban (ELIQUIS) 5 MG  TABS tablet Take 5 mg by mouth 2 (two) times daily.    [provider]  Ascorbic Acid (VITAMIN C) 1000 MG tablet Take 1,000 mg by mouth daily.    [provider]  atorvastatin (LIPITOR) 40 MG tablet Take 40 mg by mouth at bedtime. 01/14/21   [provider]  calcitRIOL (ROCALTROL) 0.5 MCG capsule Take 0.5 mcg by mouth daily.    [provider]  lenvatinib 10 mg daily dose (LENVIMA, 10 MG DAILY DOSE,) capsule Take 10 mg by mouth See admin instructions. 10mg  daily for 2 weeks, off for 1 week, then repeat. 01/02/21   [provider]  levothyroxine (SYNTHROID) 150 MCG tablet Take by mouth. 04/22/21   [provider]  lisinopril (ZESTRIL) 20 MG tablet Take 1 tablet by mouth daily. 04/28/21   [provider]  methenamine (HIPREX) 1 g tablet Take 1 g by mouth 2 (two) times daily. 12/14/20   [provider]  metoprolol succinate (TOPROL-XL) 50 MG 24 hr tablet Take 50 mg by mouth daily. 01/14/21   [provider]  Multiple Vitamin (MULTIVITAMIN) tablet Take 1 tablet by mouth daily.    [provider]  psyllium (METAMUCIL) 58.6 % packet Take 1 packet by mouth daily.    [provider]    Inpatient Medications:  Current Facility-Administered Medications:    0.9 %  sodium chloride infusion, , Intravenous, Continuous, Paul Dubois, MD, Last Rate: 75 mL/hr at 08/31/21 0857, Infusion Verify at 08/31/21 0857   acetaminophen (TYLENOL) tablet 650 mg, 650 mg, Oral, Q6H PRN, Paul Dubois, MD   bisacodyl (DULCOLAX) suppository 10 mg, 10 mg, Rectal, Daily PRN, Lady Deutscher, MD   calcium gluconate 1 g/ 50 mL sodium chloride IVPB, 1 g, Intravenous, Once, Paul Dubois, MD, Last Rate: 50 mL/hr at 08/31/21 0857, Infusion Verify at 08/31/21 0857   Chlorhexidine Gluconate Cloth 2 % PADS 6 each, 6 each, Topical, Daily, Paul Dubois, MD   guaiFENesin-dextromethorphan (ROBITUSSIN DM) 100-10 MG/5ML syrup 5 mL, 5 mL, Oral, Q4H  PRN, Lady Deutscher, MD, 5 mL at 08/30/21 2201   insulin aspart (novoLOG) injection 0-15 Units, 0-15 Units, Subcutaneous, TID WC, Lady Deutscher, MD, 2 Units at 08/30/21 1837   ipratropium-albuterol (DUONEB) 0.5-2.5 (3) MG/3ML nebulizer solution 3 mL, 3 mL, Nebulization, Q6H PRN, Paul Dubois, MD   midodrine (PROAMATINE) tablet 10 mg, 10 mg, Oral, TID WC, Zierle-Ghosh, Asia B, DO, 10 mg at 08/30/21 2114   morphine (PF) 2 MG/ML injection 2 mg, 2 mg, Intravenous, Q3H PRN, Paul Dubois, MD   ondansetron (ZOFRAN) tablet 8 mg, 8 mg, Oral, Q6H PRN **OR** ondansetron (ZOFRAN) 8 mg in sodium chloride 0.9 % 50 mL IVPB, 8 mg, Intravenous, Q6H PRN, Lady Deutscher, MD  Oral care mouth rinse, 15 mL, Mouth Rinse, PRN, Lady Deutscher, MD   pantoprazole (PROTONIX) EC tablet 40 mg, 40 mg, Oral, Daily, Paul Dubois, MD   sodium chloride flush (NS) 0.9 % injection 3 mL, 3 mL, Intravenous, Q12H, Lady Deutscher, MD, 3 mL at 08/30/21 2036 Allergies: Penicillins  Complete Review of Systems: GENERAL: negative for malaise, night sweats HEENT: No changes in hearing or vision, no nose bleeds or other nasal problems. NECK: Negative for lumps, goiter, pain and significant neck swelling RESPIRATORY: Negative for cough, wheezing CARDIOVASCULAR: Negative for chest pain, leg swelling, palpitations, orthopnea GI: SEE HPI MUSCULOSKELETAL: Negative for joint pain or swelling, back pain, and muscle pain. SKIN: Negative for lesions, rash PSYCH: Negative for sleep disturbance, mood disorder and recent psychosocial stressors. HEMATOLOGY Negative for prolonged bleeding, bruising easily, and swollen nodes. ENDOCRINE: Negative for cold or heat intolerance, polyuria, polydipsia and goiter. NEURO: negative for tremor, gait imbalance, syncope and seizures. The remainder of the review of systems is noncontributory.  Physical Exam: BP 125/60   Pulse 63   Temp 97.8 F (36.6 C) (Axillary)   Resp 14   Ht 6'  1" (1.854 m)   Wt 97.2 kg   SpO2 96%   BMI 28.27 kg/m  GENERAL: The patient is AO x3, in no acute distress. HEENT: Head is normocephalic and atraumatic. EOMI are intact. Mouth is well hydrated and without lesions. NECK: Supple. No masses LUNGS: Clear to auscultation. No presence of rhonchi/wheezing/rales. Adequate chest expansion HEART: RRR, normal s1 and s2. ABDOMEN: Soft, nontender, no guarding, no peritoneal signs, and nondistended. BS +. No masses. EXTREMITIES: Without any cyanosis, clubbing, rash, lesions or edema. NEUROLOGIC: AOx3, no focal motor deficit. SKIN: no jaundice, no rashes  Laboratory Data CBC:     Component Value Date/Time   WBC 5.7 08/31/2021 0551   RBC 2.87 (L) 08/31/2021 0551   HGB 9.0 (L) 08/31/2021 0551   HCT 26.1 (L) 08/31/2021 0551   PLT 103 (L) 08/31/2021 0551   MCV 90.9 08/31/2021 0551   MCH 31.4 08/31/2021 0551   MCHC 34.5 08/31/2021 0551   RDW 13.9 08/31/2021 0551   LYMPHSABS 1.1 08/30/2021 1338   MONOABS 0.7 08/30/2021 1338   EOSABS 0.0 08/30/2021 1338   BASOSABS 0.0 08/30/2021 1338   COAG:  Lab Results  Component Value Date   INR 1.9 (H) 08/30/2021   INR 1.2 02/11/2021   INR 1.1 07/08/2018    BMP:     Latest Ref Rng & Units 08/31/2021    5:51 AM 08/30/2021    1:38 PM 02/12/2021    3:19 AM  BMP  Glucose 70 - 99 mg/dL 115  141  94   BUN 8 - 23 mg/dL $Remove'22  21  16   'CnCcDRg$ Creatinine 0.61 - 1.24 mg/dL 1.17  1.22  0.99   Sodium 135 - 145 mmol/L 125  122  133   Potassium 3.5 - 5.1 mmol/L 4.6  4.1  3.9   Chloride 98 - 111 mmol/L 95  87  101   CO2 22 - 32 mmol/L $RemoveB'22  23  25   'vNVlnEGE$ Calcium 8.9 - 10.3 mg/dL 5.6  6.0  7.8     HEPATIC:     Latest Ref Rng & Units 08/31/2021    5:51 AM 08/30/2021    1:38 PM 06/06/2018    6:27 AM  Hepatic Function  Total Protein 6.5 - 8.1 g/dL 5.4  5.6  6.2   Albumin 3.5 - 5.0 g/dL 2.7  2.8  2.7   AST 15 - 41 U/L 21  26  37   ALT 0 - 44 U/L 29  32  18   Alk Phosphatase 38 - 126 U/L 39  44  39   Total Bilirubin 0.3 - 1.2  mg/dL 1.2  0.7  0.8     CARDIAC:  Lab Results  Component Value Date   TROPONINI <0.03 06/05/2018     Imaging: I personally reviewed and interpreted the available imaging.  Assessment & Plan: 76 y.o. male with history of BPH, papillary thyroid cancer, A-fib on Eliquis, diabetes, coronary artery disease, osteomyelitis, hypertension, who came to the hospital after presenting worsening lightheadedness and fatigue.  Gastroenterology was consulted as the patient presented significant drop in his hemoglobin down to 8.7 with positive FOBT.  He has not reported any overt gastrointestinal bleeding symptoms and since being admitted to the hospital nursing staff have not noticed any overt bleeding.  Given the acute drop in his hemoglobin, it will be important to proceed with endoscopic investigations to evaluate his anemia further with an EGD and a possible colonoscopy.  However, he is presenting with major derangements in his electrolytes which will need to be corrected prior to proceeding with endoscopic investigations.  He understood and agreed with this.  This was communicated to the hospitalist which will work on stabilizing his electrolytes.  He has presented significant nausea and poor oral intake which has been attributed to the antibiotic use.  This may have led to the episode of hypotension.  We will need to optimize his nausea regimen.  He will need to take Zofran standing and have rescue doses of Reglan if persistently nauseated or vomiting.  He will be kept on a clear liquid diet for now.  - Repeat CBC qday, transfuse if Hb <7 - Pantoprazole 40 mg q12h IVP - 2 large bore IV lines - Active T/S - CLD - Avoid NSAIDs - Hold Eliquis -Standing Zofran 4 mg every 12 hours -Can use Reglan as needed for breakthrough episodes of nausea and vomiting - Will proceed with EGD and colonoscopy once electrolytes have been corrected, sodium should be on a daily 130 or higher and corrected calcium should be in  the 9s range  Paul Quale, MD Gastroenterology and Hepatology [facility]

## 2021-08-31 NOTE — Assessment & Plan Note (Addendum)
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 -After fluid resuscitation and the use of sodium tablets level up to 128; continue to follow trend and further replete as needed. -Patient advised to maintain adequate hydration. -Continue telemetry monitoring. -Starting repletion with OsCal.

## 2021-08-31 NOTE — Assessment & Plan Note (Signed)
-   In the setting of hypovolemia from dehydration and concern for blood loss -Hemoglobin dropped to 8.7 from 12.6 in about 35-month-Vital signs currently stable -Hemoglobin 8.0. -No need for pressors. -Continue minimizing antihypertensive agents.

## 2021-08-31 NOTE — Assessment & Plan Note (Signed)
-   Holding rate control agents in the setting of hypovolemic shock and low blood pressure. -Holding anticoagulation due to positive blood in stools and decreasing hemoglobin level.

## 2021-09-01 DIAGNOSIS — D62 Acute posthemorrhagic anemia: Secondary | ICD-10-CM | POA: Diagnosis not present

## 2021-09-01 DIAGNOSIS — K922 Gastrointestinal hemorrhage, unspecified: Secondary | ICD-10-CM | POA: Diagnosis not present

## 2021-09-01 DIAGNOSIS — K921 Melena: Secondary | ICD-10-CM | POA: Diagnosis not present

## 2021-09-01 DIAGNOSIS — I482 Chronic atrial fibrillation, unspecified: Secondary | ICD-10-CM | POA: Diagnosis not present

## 2021-09-01 DIAGNOSIS — R579 Shock, unspecified: Secondary | ICD-10-CM | POA: Diagnosis not present

## 2021-09-01 LAB — BASIC METABOLIC PANEL
Anion gap: 9 (ref 5–15)
BUN: 22 mg/dL (ref 8–23)
CO2: 21 mmol/L — ABNORMAL LOW (ref 22–32)
Calcium: 5.3 mg/dL — CL (ref 8.9–10.3)
Chloride: 96 mmol/L — ABNORMAL LOW (ref 98–111)
Creatinine, Ser: 1.16 mg/dL (ref 0.61–1.24)
GFR, Estimated: >60 mL/min (ref >60)
Glucose, Bld: 96 mg/dL (ref 70–99)
Potassium: 3.8 mmol/L (ref 3.5–5.1)
Sodium: 126 mmol/L — ABNORMAL LOW (ref 135–145)

## 2021-09-01 LAB — CBC
HCT: 24.7 % — ABNORMAL LOW (ref 39.0–52.0)
Hemoglobin: 8.6 g/dL — ABNORMAL LOW (ref 13.0–17.0)
MCH: 32 pg (ref 26.0–34.0)
MCHC: 34.8 g/dL (ref 30.0–36.0)
MCV: 91.8 fL (ref 80.0–100.0)
Platelets: 102 10*3/uL — ABNORMAL LOW (ref 150–400)
RBC: 2.69 MIL/uL — ABNORMAL LOW (ref 4.22–5.81)
RDW: 14.3 % (ref 11.5–15.5)
WBC: 5.4 10*3/uL (ref 4.0–10.5)
nRBC: 0 % (ref 0.0–0.2)

## 2021-09-01 LAB — GLUCOSE, CAPILLARY
Glucose-Capillary: 109 mg/dL — ABNORMAL HIGH (ref 70–99)
Glucose-Capillary: 129 mg/dL — ABNORMAL HIGH (ref 70–99)
Glucose-Capillary: 138 mg/dL — ABNORMAL HIGH (ref 70–99)
Glucose-Capillary: 85 mg/dL (ref 70–99)
Glucose-Capillary: 96 mg/dL (ref 70–99)
Glucose-Capillary: 99 mg/dL (ref 70–99)

## 2021-09-01 MED ORDER — SODIUM CHLORIDE 1 G PO TABS
1.0000 g | ORAL_TABLET | Freq: Every day | ORAL | Status: DC
  Administered 2021-09-01 – 2021-09-09 (×9): 1 g via ORAL
  Filled 2021-09-01 (×8): qty 1

## 2021-09-01 MED ORDER — CALCIUM GLUCONATE-NACL 1-0.675 GM/50ML-% IV SOLN
1.0000 g | Freq: Once | INTRAVENOUS | Status: AC
  Administered 2021-09-01: 1000 mg via INTRAVENOUS
  Filled 2021-09-01: qty 50

## 2021-09-01 MED ORDER — CALCIUM GLUCONATE-NACL 2-0.675 GM/100ML-% IV SOLN
2.0000 g | Freq: Once | INTRAVENOUS | Status: DC
  Filled 2021-09-01: qty 100

## 2021-09-01 MED ORDER — MORPHINE SULFATE (PF) 2 MG/ML IV SOLN: 2.0000 mg | INTRAVENOUS | Status: DC | PRN

## 2021-09-01 NOTE — Progress Notes (Signed)
Hgb 8.6 no s/s bleeding pt had 1 med/lg BM brown in color, no visible blood noted. Ca+ 5.3, Na+ 126 notified Dr. Earnest Conroy

## 2021-09-01 NOTE — Progress Notes (Signed)
Progress Note   Patient: Paul Bradley VHQ:[id number] DOB: March 26, 1945 DOA: 08/30/2021     2 DOS: the patient was seen and examined on 09/01/2021   Brief hospital admission course: As per H&P written by Dr. Evangeline Gula on 08/30/21 Paul Bradley is a 76 y.o. 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 twice daily, diabetes mellitus type 2, hypertension, chronic atrial fibrillation on Eliquis who presented today via EMS 3 days after being discharged from [facility] with concerns for weakness.  Patient has multiple medical issues but recently his biggest issue has been the osteomyelitis of his second toe.  He has been on linezolid and since he started that medication has had terrible stomach problems.  His appetite has been very poor and his p.o. intake has been very poor.  Notably he was not taking any probiotics but was eating yogurt.  He has become dehydrated several times since starting this medication.  EMS today felt the patient was weaker than usual even after his recent hospitalization as they are familiar with him.  Is a poor historian and other than neck pain has no acute complaints however his blood pressure was markedly low in the emergency department and he was started on several boluses of IV fluid and placed in Trendelenburg due to hypotension.  Family members report that the patient is weaker than usual today.  Patient was tachycardic with a heart rate in the 140s and blood pressures in the 70s when he was initially picked up by EMS.     ED Course: Multiple liter boluses of normal saline with resultant blood pressure in the 80s."  Hemoccult in the emergency department found to be positive with a hemoglobin of 8.4 which is substantially a drop from February where it was approximately 12.  Sodium noted to be markedly low at 122.   1 unit of packed red blood cells was ordered.and pt referred to me for evaluation and admission.  Review of care everywhere shows hemoglobin on 8/15 was 10.3 it was 12.3 on 8/7 and was 9.2 on 8/16.  Assessment and Plan: * 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[place]) - In the setting of hypovolemia from dehydration and concern for blood loss -Hemoglobin dropped to 8.7 from 12.6 in about 38-month-Excellent response to fluid resuscitation and blood transfusion. -Vital signs currently stable. -No need for pressors. -Follow blood pressure trend. -Continue minimizing antihypertensive agents.  Nausea and vomiting - Appears to be associated with the use of antibiotics -Will hold for the next 24 hours -Continue antiemetics.  Blood in stool -With concern for lower GI bleed -Continue PPI -GI service consulted -Continue to follow hemoglobin trend and transfuse as needed -Continue to hold anticoagulation therapy -Currently hemodynamically stable. -Hemoglobin 8.6 -GI service has allow full liquid diet and is planning for endoscopy and colonoscopy once electrolytes are further stabilized.  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 ([place] -Status post amputation, less first 2 toes bilaterally; has healed adequately and there is no signs of infection. -Has been on suppressive antibiotic therapy; the plan was to stop antibiotics on 09/09/2021; reporting the presence of anemia and thrombocytopenia will hold any further treatment with Zyvox at this point. -Patient remains afebrile with normal WBC.   [place] -Continue outpatient follow-up with oncology service -Chemotherapy agents was on hold per most recent records.  Diabetes mellitus without complication (  [place]) - Continue sliding scale insulin -Follow CBGs fluctuation and adjust hypoglycemic regimen as needed -A1c 6.1  Chronic a-fib on Eliquis - Holding rate control agents in the setting of hypovolemic shock and low blood pressure. -Holding anticoagulation  due to positive blood in stools and decreasing hemoglobin level.  Hypertension -Patient admitted with hypotension -Continue holding antihypertensive agents at this point -Follow vital signs. -Continue to maintain Adequate hydration. -Patient is not requiring pressor supports and is hemodynamically stable to be transferred to telemetry bed.  Pressure injury of skin - Continue constant repositioning and wound care recommendations. -No signs of superimposed infection; pressure injuries were present at time of admission. -Stage I left buttocks and stage II medial aspect coccyx area.  Suprapubic catheter ([place]) -Chronic; due to to be exchange on 09/02/2021 -Will touch base with urology service.  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 -After fluid resuscitation sodium up to 126; will start sodium tablet supplementation and follow electrolytes trend. -Patient advised to maintain adequate hydration. -Continue telemetry monitoring.    Subjective:  No fever, no chest pain, no nausea vomiting.  Reports improvement in his breathing and is just complaining of intermittent nonproductive coughing spells.  Denies overt bleeding.  Physical Exam: Vitals:   09/01/21 1600 09/01/21 1615 09/01/21 1700 09/01/21 1800  BP: 107/62  130/73 (!) 139/54  Pulse: 64  77 70  Resp: '17  16 20  '$ Temp:  98.8 F (37.1 C)    TempSrc:  Oral    SpO2: 96%  97% 98%  Weight:      Height:       General exam: Alert, awake, oriented x 3, feeling better overall; expresses some intermittent coughing spells (but improving) without significant respiratory distress.  Good saturation on room air.  No chest pain, no nausea, no vomiting. Respiratory system: Positive scattered rhonchi bilaterally; no using accessory muscles. Cardiovascular system:RRR. No rubs or gallops; no JVD on  exam. Gastrointestinal system: Abdomen is nondistended, soft and nontender. No organomegaly or masses felt. Normal bowel sounds heard. Central nervous system: Alert and oriented. No focal neurological deficits. Extremities: No cyanosis or clubbing. Skin: No petechiae.  At time of admission a stage I left buttock pressure injury and a stage II coccyx pressure injury appreciated without signs of superimposed infection (both of been unchanged). Psychiatry: Judgement and insight appear normal. Mood & affect appropriate.   Data Reviewed: A1c 6.1 Basic metabolic panel: Sodium 076, potassium 3.8, chloride 96, bicarb 21, BUN 22, creatinine 1.16 and calcium 5.3. CBC: White blood cells 5.4, hemoglobin 8.6, platelet count 102K   Family Communication: No family at bedside.  Disposition: Status is: Inpatient Remains inpatient appropriate because: Follow-up with lower GI bleed and hypovolemic shock.  Multiple electrolytes abnormality; will require endoscopic evaluation once stable.   Planned Discharge Destination: Home; most likely with home health services.  Author: Barton Dubois, MD 09/01/2021 6:22 PM  For on call review [email or web address].

## 2021-09-01 NOTE — Progress Notes (Signed)
Flutter given to patient.  Patient was able to do X10 with good effort.  Left at bedside for patient to use.

## 2021-09-01 NOTE — Progress Notes (Signed)
Gastroenterology Progress Note    Patient ID: Paul Bradley; [id number]; 1945/10/17    Subjective   No abdominal pain, N/V. On clear liquids. No overt GI bleeding. No diarrhea.    Objective   Vital signs in last 24 hours Temp:  [96.8 F (36 C)-98.2 F (36.8 C)] 98 F (36.7 C) (08/21 0724) Pulse Rate:  [58-78] 71 (08/21 0724) Resp:  [11-20] 14 (08/21 0724) BP: (88-145)/(44-102) 111/63 (08/21 0600) SpO2:  [92 %-99 %] 96 % (08/21 0724) Weight:  [96.4 kg] 96.4 kg (08/21 0335) Last BM Date : 08/30/21  Physical Exam General:   Alert and oriented, pleasant Head:  Normocephalic and atraumatic. Abdomen:  Bowel sounds present, soft, non-tender, non-distended.  Extremities:  Without  edema. Neurologic:  Alert and  oriented x4   Intake/Output from previous day: 08/20 0701 - 08/21 0700 In: 2614.3 [I.V.:2510.3; IV Piggyback:104] Out: 1650 [Urine:1650] Intake/Output this shift: No intake/output data recorded.  Lab Results  Recent Labs    08/30/21 2242 08/31/21 0551 09/01/21 0435  WBC 7.3 5.7 5.4  HGB 8.9* 9.0* 8.6*  HCT 25.8* 26.1* 24.7*  PLT 103* 103* 102*   BMET Recent Labs    08/30/21 1338 08/31/21 0551 09/01/21 0435  NA 122* 125* 126*  K 4.1 4.6 3.8  CL 87* 95* 96*  CO2 23 22 21*  GLUCOSE 141* 115* 96  BUN '21 22 22  '$ CREATININE 1.22 1.17 1.16  CALCIUM 6.0* 5.6* 5.3*   LFT Recent Labs    08/30/21 1338 08/31/21 0551  PROT 5.6* 5.4*  ALBUMIN 2.8* 2.7*  AST 26 21  ALT 32 29  ALKPHOS 44 39  BILITOT 0.7 1.2   PT/INR Recent Labs    08/30/21 1338  LABPROT 21.2*  INR 1.9*    Studies/Results DG Chest 2 View  Result Date: 08/31/2021 CLINICAL DATA:  Short of breath.  Follow-up study. EXAM: CHEST - 2 VIEW COMPARISON:  08/30/2021 and older exams. FINDINGS: Stable cardiomegaly. Persistent lung base opacities, right greater than left, consistent with small effusions and atelectasis. Remainder of the lungs is clear. No pneumothorax. IMPRESSION: 1. No  significant change from the previous day's study. 2. Lung base opacities consistent with a combination of pleural effusions and atelectasis. Stable cardiomegaly. Electronically Signed   By: Lajean Manes M.D.   On: 08/31/2021 10:53   DG Chest Port 1 View  Result Date: 08/30/2021 CLINICAL DATA:  Questionable sepsis EXAM: PORTABLE CHEST 1 VIEW COMPARISON:  06/05/2018 FINDINGS: No focal consolidation. Trace bilateral pleural effusions. No pneumothorax. Stable cardiomegaly. No acute osseous abnormality. IMPRESSION: 1. Trace bilateral pleural effusions. Electronically Signed   By: Kathreen Devoid M.D.   On: 08/30/2021 14:36    Assessment  76 y.o. male with a history of BPH, papillary thyroid cancer, A-fib on Eliquis, diabetes, coronary artery disease, osteomyelitis, hypertension, who came to the hospital presenting with worsening lightheadedness and fatigue.  Admitted with shock in setting of hypovolemia, concern for blood loss; Gastroenterology was consulted as the patient presented significant drop in his hemoglobin down to 8.7 with positive FOBT.   Anemia: heme positive stool but no overt GI bleeding. Prior CBC at outside facility in July with Hgb 13.4. This drifted down during that admission to 9.2. Presenting Hgb this admission 8.7. Remaining stable. Will need colonoscopy/EGD once sodium improved to 130 and corrected calcium in the 9 range.   Will allow full liquids. Can reassess tomorrow for candidacy to prep for procedures.     Plan / Recommendations  Will need colonoscopy/EGD once sodium and calcium normalized Advance diet to full liquids Continue to hold Eliquis PPI BID Will reassess tomorrow.     LOS: 2 days    09/01/2021, 8:29 AM  [name], PhD, [facility] Gastroenterology

## 2021-09-02 DIAGNOSIS — R112 Nausea with vomiting, unspecified: Secondary | ICD-10-CM | POA: Diagnosis not present

## 2021-09-02 DIAGNOSIS — D62 Acute posthemorrhagic anemia: Secondary | ICD-10-CM | POA: Diagnosis not present

## 2021-09-02 DIAGNOSIS — K922 Gastrointestinal hemorrhage, unspecified: Secondary | ICD-10-CM | POA: Diagnosis not present

## 2021-09-02 DIAGNOSIS — K921 Melena: Secondary | ICD-10-CM | POA: Diagnosis not present

## 2021-09-02 DIAGNOSIS — E871 Hypo-osmolality and hyponatremia: Secondary | ICD-10-CM

## 2021-09-02 LAB — URINE CULTURE: Culture: >=100000 — AB

## 2021-09-02 LAB — CBC
HCT: 22.8 % — ABNORMAL LOW (ref 39.0–52.0)
Hemoglobin: 8 g/dL — ABNORMAL LOW (ref 13.0–17.0)
MCH: 32 pg (ref 26.0–34.0)
MCHC: 35.1 g/dL (ref 30.0–36.0)
MCV: 91.2 fL (ref 80.0–100.0)
Platelets: 102 10*3/uL — ABNORMAL LOW (ref 150–400)
RBC: 2.5 MIL/uL — ABNORMAL LOW (ref 4.22–5.81)
RDW: 14.3 % (ref 11.5–15.5)
WBC: 5.1 10*3/uL (ref 4.0–10.5)
nRBC: 0 % (ref 0.0–0.2)

## 2021-09-02 LAB — GLUCOSE, CAPILLARY
Glucose-Capillary: 110 mg/dL — ABNORMAL HIGH (ref 70–99)
Glucose-Capillary: 150 mg/dL — ABNORMAL HIGH (ref 70–99)
Glucose-Capillary: 104 mg/dL — ABNORMAL HIGH (ref 70–99)
Glucose-Capillary: 113 mg/dL — ABNORMAL HIGH (ref 70–99)

## 2021-09-02 LAB — BASIC METABOLIC PANEL
Anion gap: 7 (ref 5–15)
BUN: 17 mg/dL (ref 8–23)
CO2: 23 mmol/L (ref 22–32)
Calcium: 5.4 mg/dL — CL (ref 8.9–10.3)
Chloride: 98 mmol/L (ref 98–111)
Creatinine, Ser: 1.17 mg/dL (ref 0.61–1.24)
GFR, Estimated: >60 mL/min (ref >60)
Glucose, Bld: 92 mg/dL (ref 70–99)
Potassium: 3.6 mmol/L (ref 3.5–5.1)
Sodium: 128 mmol/L — ABNORMAL LOW (ref 135–145)

## 2021-09-02 MED ORDER — CALCIUM CARBONATE 1250 (500 CA) MG PO TABS
1.0000 | ORAL_TABLET | Freq: Two times a day (BID) | ORAL | Status: DC
  Administered 2021-09-02 – 2021-09-09 (×15): 1250 mg via ORAL
  Filled 2021-09-02 (×15): qty 1

## 2021-09-02 NOTE — Progress Notes (Signed)
Progress Note   Patient: Paul Bradley PPJ:[id number] DOB: 10-09-1945 DOA: 08/30/2021     3 DOS: the patient was seen and examined on 09/02/2021   Brief hospital admission course: As per H&P written by Dr. Evangeline Gula on 08/30/21 Paul Bradley is a 76 y.o. 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 twice daily, diabetes mellitus type 2, hypertension, chronic atrial fibrillation on Eliquis who presented today via EMS 3 days after being discharged from [facility] with concerns for weakness.  Patient has multiple medical issues but recently his biggest issue has been the osteomyelitis of his second toe.  He has been on linezolid and since he started that medication has had terrible stomach problems.  His appetite has been very poor and his p.o. intake has been very poor.  Notably he was not taking any probiotics but was eating yogurt.  He has become dehydrated several times since starting this medication.  EMS today felt the patient was weaker than usual even after his recent hospitalization as they are familiar with him.  Is a poor historian and other than neck pain has no acute complaints however his blood pressure was markedly low in the emergency department and he was started on several boluses of IV fluid and placed in Trendelenburg due to hypotension.  Family members report that the patient is weaker than usual today.  Patient was tachycardic with a heart rate in the 140s and blood pressures in the 70s when he was initially picked up by EMS.     ED Course: Multiple liter boluses of normal saline with resultant blood pressure in the 80s."  Hemoccult in the emergency department found to be positive with a hemoglobin of 8.4 which is substantially a drop from February where it was approximately 12.  Sodium noted to be markedly low at 122.   1 unit of packed red blood cells was ordered.and pt referred to me for evaluation and admission.  Review of care everywhere shows hemoglobin on 8/15 was 10.3 it was 12.3 on 8/7 and was 9.2 on 8/16.  Assessment and Plan: * 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[place]) - In the setting of hypovolemia from dehydration and concern for blood loss -Hemoglobin dropped to 8.7 from 12.6 in about 45-month-Vital signs currently stable -Hemoglobin 8.0. -No need for pressors. -Continue minimizing antihypertensive agents.  Nausea and vomiting - Appears to be associated with the use of antibiotics -Will hold for the next 24 hours -Continue antiemetics.  Blood in stool -With concern for lower GI bleed -Continue PPI -GI service consulted -Continue to follow hemoglobin trend and transfuse as needed -Continue to hold anticoagulation therapy -Currently hemodynamically stable. -Hemoglobin 8.6 -GI service has allow full liquid diet and is planning for endoscopy and colonoscopy once electrolytes are further stabilized.  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 ([place] -Status post amputation, less first 2 toes bilaterally; has healed adequately and there is no signs of infection. -Has been on suppressive antibiotic therapy; the plan was to stop antibiotics on 09/09/2021; reporting the presence of anemia and thrombocytopenia will hold any further treatment with Zyvox at this point. -Patient remains afebrile with normal WBC.   [place]yroid cancer ([place] -Continue outpatient follow-up with oncology service -Chemotherapy agents was on hold per most recent records.  Diabetes mellitus without complication ([facility]) - Continue sliding scale insulin -Follow CBGs fluctuation and  adjust hypoglycemic regimen as needed -A1c 6.1  Chronic a-fib on Eliquis - Holding rate control agents in the setting of hypovolemic shock and low blood pressure. -Holding anticoagulation due to positive blood in stools and decreasing hemoglobin  level.  Hypertension -Patient admitted with hypotension -Continue holding antihypertensive agents at this point -Follow vital signs. -Continue to maintain Adequate hydration. -Patient is not requiring pressor supports and is hemodynamically stable to be transferred to telemetry bed.  Pressure injury of skin - Continue constant repositioning and wound care recommendations. -No signs of superimposed infection; pressure injuries were present at time of admission. -Stage I left buttocks and stage II medial aspect coccyx area.  Suprapubic catheter ([place]) -Chronic; due to to be exchange on 09/02/2021 -Will touch base with urology service.  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 -After fluid resuscitation and the use of sodium tablets level up to 128; continue to follow trend and further replete as needed. -Patient advised to maintain adequate hydration. -Continue telemetry monitoring. -Starting repletion with OsCal.    ABLA (acute blood loss anemia) - With concern for lower GI bleed in a patient with chronic anticoagulation therapy. -Gastroenterology service planning for endoscopy and colonoscopy once medically stable and electrolytes repleted.   Subjective:  No overt bleeding appreciated.  Reports no chest pain, no shortness of breath, no abdominal pain, no nausea or vomiting.  Tolerating full liquid diet.  Overall feeling better.  Physical Exam: Vitals:   09/01/21 1934 09/01/21 1938 09/01/21 2000 09/02/21 0500  BP: (!) 165/79  (!) 113/52 131/79  Pulse: 91  65 75  Resp: '18  18 20  '$ Temp: 98.2 F (36.8 C) (!) 97.2 F (36.2 C)  98.4 F (36.9 C)  TempSrc: Oral Oral    SpO2: 96%  96% 96%  Weight:      Height:       General exam: Alert, awake, oriented x 3; reports feeling better and so far tolerating full liquid diet.  Reports bowel  movement without blood in it.  No abdominal pain, chest pain or shortness of breath. Respiratory system: Clear to auscultation. Respiratory effort normal.  No using accessory muscle.  Good saturation on room air. Cardiovascular system:Rate controlled. No gallops; no JVD. Gastrointestinal system: Abdomen is nondistended, soft and nontender. No organomegaly or masses felt. Normal bowel sounds heard. Central nervous system: Alert and oriented. No focal neurological deficits. Extremities: No cyanosis or clubbing. Skin: No petechiae; on change stage I left buttock pressure injury and stage II coccyx injury present at time of admission. Psychiatry: Judgement and insight appear normal. Mood & affect appropriate.   Data Reviewed: A1c 6.1 Basic metabolic panel: Sodium 222, potassium 3.6, chloride 98, bicarb 23, BUN 17, creatinine 1.17; calcium 5.4 CBC: WBCs 5.1, hemoglobin 8.0 and platelets count 102K   Family Communication: No family at bedside.  Disposition: Status is: Inpatient Remains inpatient appropriate because: Follow-up with lower GI bleed and hypovolemic shock.  Multiple electrolytes abnormality; will require endoscopic evaluation once stable.   Planned Discharge Destination: Home; most likely with home health services.  Author: Barton Dubois, MD 09/02/2021 5:00 PM  For on call review [email or web address].

## 2021-09-02 NOTE — Progress Notes (Signed)
Subjective: Eating breakfast.  Reports he is feeling better today compared yesterday.  Denies abdominal pain, nausea, vomiting.  Reports he had a bowel movement last night around 7 PM that was normal in consistency without bright red blood or melena.   Objective: Vital signs in last 24 hours: Temp:  [97.2 F (36.2 C)-98.8 F (37.1 C)] 98.4 F (36.9 C) (08/22 0500) Pulse Rate:  [61-91] 75 (08/22 0500) Resp:  [10-21] 20 (08/22 0500) BP: (107-165)/(52-79) 131/79 (08/22 0500) SpO2:  [96 %-99 %] 96 % (08/22 0500) Last BM Date : 09/02/21 General:   Alert and oriented, pleasant, NAD Head:  Normocephalic and atraumatic. Eyes:  No icterus, sclera clear. Conjuctiva pink.  Abdomen:  Bowel sounds present, soft, non-tender, non-distended. No HSM or hernias noted. No rebound or guarding. No masses appreciated  Msk:  Symmetrical without gross deformities. Normal posture. Extremities:  Without edema. Neurologic:  Alert and  oriented x4;  grossly normal neurologically. Skin:  Warm and dry, intact without significant lesions.  Psych: Normal mood and affect.  Intake/Output from previous day: 08/21 0701 - 08/22 0700 In: -  Out: 1000 [Urine:1000] Intake/Output this shift: No intake/output data recorded.  Lab Results: Recent Labs    08/31/21 0551 09/01/21 0435 09/02/21 0304  WBC 5.7 5.4 5.1  HGB 9.0* 8.6* 8.0*  HCT 26.1* 24.7* 22.8*  PLT 103* 102* 102*   BMET Recent Labs    08/31/21 0551 09/01/21 0435 09/02/21 0304  NA 125* 126* 128*  K 4.6 3.8 3.6  CL 95* 96* 98  CO2 22 21* 23  GLUCOSE 115* 96 92  BUN '22 22 17  '$ CREATININE 1.17 1.16 1.17  CALCIUM 5.6* 5.3* 5.4*   LFT Recent Labs    08/30/21 1338 08/31/21 0551  PROT 5.6* 5.4*  ALBUMIN 2.8* 2.7*  AST 26 21  ALT 32 29  ALKPHOS 44 39  BILITOT 0.7 1.2   PT/INR Recent Labs    08/30/21 1338  LABPROT 21.2*  INR 1.9*   Studies/Results: DG Chest 2 View  Result Date: 08/31/2021 CLINICAL DATA:  Short of breath.   Follow-up study. EXAM: CHEST - 2 VIEW COMPARISON:  08/30/2021 and older exams. FINDINGS: Stable cardiomegaly. Persistent lung base opacities, right greater than left, consistent with small effusions and atelectasis. Remainder of the lungs is clear. No pneumothorax. IMPRESSION: 1. No significant change from the previous day's study. 2. Lung base opacities consistent with a combination of pleural effusions and atelectasis. Stable cardiomegaly. Electronically Signed   By: Lajean Manes M.D.   On: 08/31/2021 10:53    Assessment: 76 y.o. male with a history of BPH, papillary thyroid cancer, A-fib on Eliquis, diabetes, coronary artery disease, osteomyelitis, hypertension, who came to the hospital presenting with worsening lightheadedness and fatigue.  Admitted 8/19 with shock in setting of hypovolemia, concern for blood loss; Gastroenterology was consulted as the patient presented significant drop in his hemoglobin down to 8.7 with positive FOBT.   Anemia: Hemoglobin 8.7 on admission, stool heme positive, but no overt GI bleeding.  Hemoglobin previously 13.4 in July, down to 12.3 in early August, 9.2 on 8/16.  Hemoglobin fluctuating this admission, 8.0 this morning. Continues without overt GI bleeding.  Etiology of anemia and heme positive stool is not clear; he could be bleeding from essentially anywhere in the GI tract.  Last colonoscopy in April 2020 at [facility] with diverticulosis per record review.  No prior EGD.  We have recommended EGD and colonoscopy this admission once electrolytes have been  corrected.  Unfortunately, sodium remains low at 128 and calcium low at 5.4, everything is from proceeding with endoscopic evaluation.  Nausea/Vomiting:  Initially presented with significant nausea, vomiting, poor p.o. intake, likely secondary to antibiotic use in the setting of osteomyelitis.  Symptoms have now resolved.  Plan: Continue to monitor hemoglobin daily.  Transfuse for hemoglobin less than 7. Continue  Protonix 40 mg IV twice daily. Continue to hold Eliquis. EGD and colonoscopy once electrolytes have been corrected.  Sodium should be around 130 or higher and corrected calcium should be in the 9 range. Reassess tomorrow.   LOS: 3 days    09/02/2021, 8:02 AM   Aliene Altes, PA-C [facility]

## 2021-09-02 NOTE — Assessment & Plan Note (Signed)
-   With concern for lower GI bleed in a patient with chronic anticoagulation therapy. -Gastroenterology service planning for endoscopy and colonoscopy once medically stable and electrolytes repleted.

## 2021-09-02 NOTE — Progress Notes (Signed)
Received critical value of calcium of 5.4  notified night time hospitalist. No new orders received at this time.

## 2021-09-02 NOTE — Progress Notes (Signed)
Patient rested well during shift.  Patient had no new complaints. Vitals have been stable.

## 2021-09-03 DIAGNOSIS — R579 Shock, unspecified: Secondary | ICD-10-CM | POA: Diagnosis not present

## 2021-09-03 LAB — BASIC METABOLIC PANEL
Anion gap: 6 (ref 5–15)
BUN: 12 mg/dL (ref 8–23)
CO2: 26 mmol/L (ref 22–32)
Calcium: 5.3 mg/dL — CL (ref 8.9–10.3)
Chloride: 99 mmol/L (ref 98–111)
Creatinine, Ser: 1.03 mg/dL (ref 0.61–1.24)
GFR, Estimated: >60 mL/min (ref >60)
Glucose, Bld: 101 mg/dL — ABNORMAL HIGH (ref 70–99)
Potassium: 3.3 mmol/L — ABNORMAL LOW (ref 3.5–5.1)
Sodium: 131 mmol/L — ABNORMAL LOW (ref 135–145)

## 2021-09-03 LAB — GLUCOSE, CAPILLARY
Glucose-Capillary: 88 mg/dL (ref 70–99)
Glucose-Capillary: 112 mg/dL — ABNORMAL HIGH (ref 70–99)
Glucose-Capillary: 109 mg/dL — ABNORMAL HIGH (ref 70–99)
Glucose-Capillary: 238 mg/dL — ABNORMAL HIGH (ref 70–99)

## 2021-09-03 LAB — COMPREHENSIVE METABOLIC PANEL
ALT: 31 U/L (ref 0–44)
AST: 27 U/L (ref 15–41)
Albumin: 2.5 g/dL — ABNORMAL LOW (ref 3.5–5.0)
Alkaline Phosphatase: 48 U/L (ref 38–126)
Anion gap: 7 (ref 5–15)
BUN: 10 mg/dL (ref 8–23)
CO2: 26 mmol/L (ref 22–32)
Calcium: 5.8 mg/dL — CL (ref 8.9–10.3)
Chloride: 97 mmol/L — ABNORMAL LOW (ref 98–111)
Creatinine, Ser: 1.06 mg/dL (ref 0.61–1.24)
GFR, Estimated: >60 mL/min (ref >60)
Glucose, Bld: 112 mg/dL — ABNORMAL HIGH (ref 70–99)
Potassium: 3.7 mmol/L (ref 3.5–5.1)
Sodium: 130 mmol/L — ABNORMAL LOW (ref 135–145)
Total Bilirubin: 1.2 mg/dL (ref 0.3–1.2)
Total Protein: 5.7 g/dL — ABNORMAL LOW (ref 6.5–8.1)

## 2021-09-03 MED ORDER — SODIUM CHLORIDE 0.9 % IV SOLN: 4.0000 g | Freq: Once | INTRAVENOUS | Status: DC

## 2021-09-03 MED ORDER — CALCIUM GLUCONATE-NACL 1-0.675 GM/50ML-% IV SOLN
1.0000 g | Freq: Once | INTRAVENOUS | Status: AC
  Administered 2021-09-03: 1000 mg via INTRAVENOUS
  Filled 2021-09-03: qty 50

## 2021-09-03 MED ORDER — VITAMIN D 25 MCG (1000 UNIT) PO TABS
1000.0000 [IU] | ORAL_TABLET | Freq: Every day | ORAL | Status: DC
  Administered 2021-09-03 – 2021-09-09 (×7): 1000 [IU] via ORAL
  Filled 2021-09-03 (×7): qty 1

## 2021-09-03 MED ORDER — CALCIUM GLUCONATE-NACL 2-0.675 GM/100ML-% IV SOLN: 2.0000 g | Freq: Once | INTRAVENOUS | Status: DC

## 2021-09-03 MED ORDER — CALCIUM GLUCONATE-NACL 1-0.675 GM/50ML-% IV SOLN
1.0000 g | INTRAVENOUS | Status: AC
  Administered 2021-09-03 (×4): 1000 mg via INTRAVENOUS
  Filled 2021-09-03 (×4): qty 50

## 2021-09-03 MED ORDER — CALCITRIOL 0.25 MCG PO CAPS
0.5000 ug | ORAL_CAPSULE | Freq: Every day | ORAL | Status: DC
Start: 2021-09-03 — End: 2021-09-09
  Administered 2021-09-03 – 2021-09-09 (×7): 0.5 ug via ORAL
  Filled 2021-09-03 (×7): qty 2

## 2021-09-03 MED ORDER — METOPROLOL SUCCINATE ER 25 MG PO TB24
25.0000 mg | ORAL_TABLET | Freq: Every day | ORAL | Status: DC
  Administered 2021-09-03 – 2021-09-09 (×7): 25 mg via ORAL
  Filled 2021-09-03 (×7): qty 1

## 2021-09-03 MED ORDER — POTASSIUM CHLORIDE CRYS ER 20 MEQ PO TBCR
40.0000 meq | EXTENDED_RELEASE_TABLET | Freq: Once | ORAL | Status: AC
  Administered 2021-09-03: 40 meq via ORAL
  Filled 2021-09-03: qty 2

## 2021-09-03 NOTE — Progress Notes (Signed)
PROGRESS NOTE  Paul Bradley LDJ:[id number] DOB: 07/14/1945 DOA: 08/30/2021 PCP: [name], MD   LOS: 4 days   Brief Narrative / Interim history: 76 year old male with history of recurrent, metastatic iodine refractory papillary thyroid carcinoma, no longer on treatment since May, suprapubic catheter, second toe osteomyelitis currently on linezolid twice daily, DM2, HTN, chronic A-fib on Eliquis comes into the hospital with weakness.  Since he has been on linezolid over the last month, he has been having poor appetite, diarrhea, and was dehydrated several times in the last month.  He has also been complaining of significant weakness.  Hospital course complicated by progressive anemia, blood in the stool, and cardiology was consulted.  Pertinent data: Chest x-ray 8/19-trace bilateral pleural effusions Chest x-ray 8/20-no significant change from previous study  Subjective / 24h Interval events: He is doing well this morning.  No abdominal pain, no nausea or vomiting.  Still weak.  No chest pain, no shortness of breath  Assesement and Plan: Principal Problem:   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[place]) Active Problems:   Nausea and vomiting   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 (HCC)   Blood in stool   Thyroid cancer ([place])   Diabetes mellitus without complication (HCC)   Chronic a-fib on Eliquis   Hypertension   Pressure injury of skin   Suprapubic catheter (HCC)   ABLA (acute blood loss anemia)   Abnormal blood electrolyte level   Hypocalcemia   Hyponatremia  Principal problem GI bleeding-patient was admitted with worsening anemia and fecal occult was positive.  No clear-cut overt GI bleeding and his hemoglobin drop has been over the past 6 months.  Gastroenterology consulted and following, they are planning to do endoscopic evaluation however they wish for his electrolytes to be corrected prior to anesthesia  Active problems Barbette Or the setting of hypovolemia and dehydration,  also concern for a component of blood loss.  Hemoglobin dropped to 8.7 from 12.6 in about 6 months.  Continue supportive care  Anemia of chronic disease, acute blood loss anemia-hemoglobin not checked today.  Yesterday was 8.0.  No frank bleeding, recheck tomorrow  Thrombocytopenia -new, platelets have been normal up until this hospitalization, I wonder whether it is a consumptive process.  Stable over the last 5 days, continue to monitor  Osteomyelitis of second toe left foot-status post amputation last month, has been on suppressive antibiotics with plans to stop on 8/29.  He has healed adequately and there is no sign of infection, previous hospitalist decided to hold any further treatment with Zyvox.  He remains afebrile and WBC is normal  Thyroid cancer-outpatient follow-up  Hypocalcemia-chronic, resume home calcitriol as well as vitamin D today, continue calcium repletion oral and IV, recheck calcium level this afternoon.  Calcium will be checked along with albumin and please use the corrected calcium when determination is to be made whether he is safe to undergo anesthesia  Chronic A-fib-his metoprolol was held due to concern for low blood pressure on admission, but his blood pressure now has improved, resume metoprolol at a lower dose than home dose.  Eliquis is on hold due to GI bleed  Essential hypertension-hypotensive on admission and he was started on midodrine.  We will see what he does with metoprolol for rate control, probably can try to wean midodrine off over the next 2 to 3 days  Pressure injury of skin, POA-stage I left buttocks and stage II medial aspect coccyx area  Suprapubic catheter-chronic, due to be exchanged on 8/22, Dr.  Madera reached out to urology based on his prior notes  Hyponatremia-possibly due to dehydration, sodium improving today  Scheduled Meds:  calcitRIOL  0.5 mcg Oral Daily   calcium carbonate  1 tablet Oral BID WC   Chlorhexidine Gluconate Cloth  6  each Topical Daily   cholecalciferol  1,000 Units Oral Daily   insulin aspart  0-15 Units Subcutaneous TID WC   metoprolol succinate  25 mg Oral Daily   midodrine  10 mg Oral TID WC   ondansetron (ZOFRAN) IV  4 mg Intravenous Q12H   pantoprazole (PROTONIX) IV  40 mg Intravenous Q12H   sodium chloride flush  3 mL Intravenous Q12H   sodium chloride  1 g Oral Daily   Continuous Infusions:  ondansetron (ZOFRAN) IV Stopped (08/31/21 0937)   PRN Meds:.acetaminophen, bisacodyl, guaiFENesin-dextromethorphan, ipratropium-albuterol, morphine injection, ondansetron **OR** ondansetron (ZOFRAN) IV, mouth rinse  Diet Orders (From admission, onward)     Start     Ordered   09/01/21 1300  Diet full liquid Room service appropriate? Yes; Fluid consistency: Thin  Diet effective now       Question Answer Comment  Room service appropriate? Yes   Fluid consistency: Thin      09/01/21 1259            DVT prophylaxis: Place and maintain sequential compression device Start: 08/30/21 2111   Lab Results  Component Value Date   PLT 102 (L) 09/02/2021      Code Status: DNR  Family Communication: No family at bedside  Status is: Inpatient Remains inpatient appropriate because: Anemia, hyocalcemia, hyponatremia  Level of care: Telemetry  Consultants:  Gastroenterology  Objective: Vitals:   09/02/21 2146 09/02/21 2219 09/03/21 0521 09/03/21 1317  BP: 119/70 109/69 134/89 (!) 117/92  Pulse: 64 66 97 74  Resp: '20 20 20 17  '$ Temp: 98.2 F (36.8 C)  97.6 F (36.4 C) 98.2 F (36.8 C)  TempSrc:      SpO2: 94% 94% 97% 91%  Weight:      Height:        Intake/Output Summary (Last 24 hours) at 09/03/2021 1330 Last data filed at 09/03/2021 0900 Gross per 24 hour  Intake 380 ml  Output 1151 ml  Net -771 ml   Wt Readings from Last 3 Encounters:  09/01/21 96.4 kg  07/21/21 90.3 kg  02/11/21 89.8 kg    Examination:  Constitutional: NAD Eyes: no scleral icterus ENMT: Mucous membranes  are moist.  Neck: normal, supple Respiratory: clear to auscultation bilaterally, no wheezing, no crackles. Normal respiratory effort. No accessory muscle use.  Cardiovascular: Regular rate and rhythm, no murmurs / rubs / gallops. No LE edema.  Abdomen: non distended, no tenderness. Bowel sounds positive.  Musculoskeletal: no clubbing / cyanosis.  Skin: no rashes Neurologic: non focal   Data Reviewed: I have independently reviewed following labs and imaging studies   CBC Recent Labs  Lab 08/30/21 1338 08/30/21 2242 08/31/21 0551 09/01/21 0435 09/02/21 0304  WBC 7.1 7.3 5.7 5.4 5.1  HGB 8.7* 8.9* 9.0* 8.6* 8.0*  HCT 25.4* 25.8* 26.1* 24.7* 22.8*  PLT 105* 103* 103* 102* 102*  MCV 91.0 92.5 90.9 91.8 91.2  MCH 31.2 31.9 31.4 32.0 32.0  MCHC 34.3 34.5 34.5 34.8 35.1  RDW 13.7 13.9 13.9 14.3 14.3  LYMPHSABS 1.1  --   --   --   --   MONOABS 0.7  --   --   --   --   EOSABS 0.0  --   --   --   --  BASOSABS 0.0  --   --   --   --     Recent Labs  Lab 08/30/21 1338 08/30/21 1517 08/30/21 2242 08/31/21 0551 09/01/21 0435 09/02/21 0304 09/03/21 0401  NA 122*  --   --  125* 126* 128* 131*  K 4.1  --   --  4.6 3.8 3.6 3.3*  CL 87*  --   --  95* 96* 98 99  CO2 23  --   --  22 21* 23 26  GLUCOSE 141*  --   --  115* 96 92 101*  BUN 21  --   --  '22 22 17 12  '$ CREATININE 1.22  --   --  1.17 1.16 1.17 1.03  CALCIUM 6.0*  --   --  5.6* 5.3* 5.4* 5.3*  AST 26  --   --  21  --   --   --   ALT 32  --   --  29  --   --   --   ALKPHOS 44  --   --  39  --   --   --   BILITOT 0.7  --   --  1.2  --   --   --   ALBUMIN 2.8*  --   --  2.7*  --   --   --   CRP 3.9*  --   --   --   --   --   --   PROCALCITON 0.17  --   --   --   --   --   --   LATICACIDVEN 3.3* 2.2* 2.7* 1.9  --   --   --   INR 1.9*  --   --   --   --   --   --   HGBA1C  --   --   --  6.1*  --   --   --      ------------------------------------------------------------------------------------------------------------------ No results for input(s): "CHOL", "HDL", "LDLCALC", "TRIG", "CHOLHDL", "LDLDIRECT" in the last 72 hours.  Lab Results  Component Value Date   HGBA1C 6.1 (H) 08/31/2021   ------------------------------------------------------------------------------------------------------------------ No results for input(s): "TSH", "T4TOTAL", "T3FREE", "THYROIDAB" in the last 72 hours.  Invalid input(s): "FREET3"  Cardiac Enzymes No results for input(s): "CKMB", "TROPONINI", "MYOGLOBIN" in the last 168 hours.  Invalid input(s): "CK" ------------------------------------------------------------------------------------------------------------------ No results found for: "BNP"  CBG: Recent Labs  Lab 09/02/21 1105 09/02/21 1620 09/02/21 2217 09/03/21 0735 09/03/21 1147  GLUCAP 150* 104* 113* 88 112*    Recent Results (from the past 240 hour(s))  Blood Culture (routine x 2)     Status: None (Preliminary result)   Collection Time: 08/30/21  1:38 PM   Specimen: BLOOD  Result Value Ref Range Status   Specimen Description BLOOD BLOOD LEFT HAND  Final   Special Requests   Final    BOTTLES DRAWN AEROBIC AND ANAEROBIC Blood Culture results may not be optimal due to an inadequate volume of blood received in culture bottles   Culture   Final    NO GROWTH 4 DAYS Performed at [facility], [address], [place], [place] [address]    Report Status PENDING  Incomplete  Urine Culture     Status: Abnormal   Collection Time: 08/30/21  1:38 PM   Specimen: Urine, Suprapubic  Result Value Ref Range Status   Specimen Description   Final    URINE, SUPRAPUBIC Performed at [facility], [address], [place],  [place] [address]    Special Requests   Final    NONE Performed at [facility], [address] [place], [place] [address]    Culture (A)  Final    >=100,000 COLONIES/mL KLEBSIELLA  OXYTOCA 80,000 COLONIES/mL PSEUDOMONAS AERUGINOSA    Report Status 09/02/2021 FINAL  Final   Organism ID, Bacteria KLEBSIELLA OXYTOCA (A)  Final   Organism ID, Bacteria PSEUDOMONAS AERUGINOSA (A)  Final      Susceptibility   Klebsiella oxytoca - MIC*    AMPICILLIN RESISTANT Resistant     CEFAZOLIN <=4 SENSITIVE Sensitive     CEFEPIME <=0.12 SENSITIVE Sensitive     CEFTRIAXONE <=0.25 SENSITIVE Sensitive     CIPROFLOXACIN <=0.25 SENSITIVE Sensitive     GENTAMICIN <=1 SENSITIVE Sensitive     IMIPENEM <=0.25 SENSITIVE Sensitive     NITROFURANTOIN 64 INTERMEDIATE Intermediate     TRIMETH/SULFA <=20 SENSITIVE Sensitive     AMPICILLIN/SULBACTAM 8 SENSITIVE Sensitive     PIP/TAZO 16 SENSITIVE Sensitive     * >=100,000 COLONIES/mL KLEBSIELLA OXYTOCA   Pseudomonas aeruginosa - MIC*    CEFTAZIDIME <=1 SENSITIVE Sensitive     CIPROFLOXACIN 0.5 SENSITIVE Sensitive     GENTAMICIN 4 SENSITIVE Sensitive     IMIPENEM 1 SENSITIVE Sensitive     PIP/TAZO <=4 SENSITIVE Sensitive     * 80,000 COLONIES/mL PSEUDOMONAS AERUGINOSA  Blood Culture (routine x 2)     Status: None (Preliminary result)   Collection Time: 08/30/21  2:18 PM   Specimen: BLOOD  Result Value Ref Range Status   Specimen Description BLOOD BLOOD RIGHT HAND  Final   Special Requests   Final    BOTTLES DRAWN AEROBIC AND ANAEROBIC Blood Culture results may not be optimal due to an excessive volume of blood received in culture bottles   Culture   Final    NO GROWTH 4 DAYS Performed at [facility], [address] [place], [place] [address]    Report Status PENDING  Incomplete  SARS Coronavirus 2 by RT PCR (hospital order, performed in [place] hospital lab) *cepheid single result test* Anterior Nasal Swab     Status: None   Collection Time: 08/30/21  4:29 PM   Specimen: Anterior Nasal Swab  Result Value Ref Range Status   SARS Coronavirus 2 by RT PCR NEGATIVE NEGATIVE Final    Comment: (NOTE) SARS-CoV-2 target nucleic acids are  NOT DETECTED.  The SARS-CoV-2 RNA is generally detectable in upper and lower respiratory specimens during the acute phase of infection. The lowest concentration of SARS-CoV-2 viral copies this assay can detect is 250 copies / mL. A negative result does not preclude SARS-CoV-2 infection and should not be used as the sole basis for treatment or other patient management decisions.  A negative result may occur with improper specimen collection / handling, submission of specimen other than nasopharyngeal swab, presence of viral mutation(s) within the areas targeted by this assay, and inadequate number of viral copies (<250 copies / mL). A negative result must be combined with clinical observations, patient history, and epidemiological information.  Fact Sheet for Patients:   https://www.patel.info/  Fact Sheet for Healthcare Providers: https://hall.com/  This test is not yet approved or  cleared by the [place] FDA and has been authorized for detection and/or diagnosis of SARS-CoV-2 by FDA under an Emergency Use Authorization (EUA).  This EUA will remain in effect (meaning this test can be used) for the duration of the COVID-19 declaration under Section 564(b)(1) of the Act,  21 U.S.C. section 360bbb-3(b)(1), unless the authorization is terminated or revoked sooner.  Performed at [facility], [address] [place], [place] [address]   MRSA Next Gen by PCR, Nasal     Status: None   Collection Time: 08/30/21  5:48 PM   Specimen: Urine, Suprapubic; Nasal Swab  Result Value Ref Range Status   MRSA by PCR Next Gen NOT DETECTED NOT DETECTED Final    Comment: (NOTE) The GeneXpert MRSA Assay (FDA approved for NASAL specimens only), is one component of a comprehensive MRSA colonization surveillance program. It is not intended to diagnose MRSA infection nor to guide or monitor treatment for MRSA infections. Test performance is not FDA approved  in patients less than 73 years old. Performed at [facility], [address] [place], [place] [address]      Radiology Studies: No results found.   Marzetta Board, MD, PhD [facility]  Between 7 am - 7 pm I am available, please contact me via Amion (for emergencies) or Securechat (non urgent messages)  Between 7 pm - 7 am I am not available, please contact night coverage MD/APP via Amion

## 2021-09-03 NOTE — Progress Notes (Signed)
   Subjective: Patient sitting up in chair upon my assessment. States he is feeling good. Ate all of his breakfast this morning without any nausea afterwards. Denies abdominal pain. Had one BM this morning but did not look at it to observe for presence of blood or melena. Denies dizziness, fatigue, SOB. Overall feels well.   Objective: Vital signs in last 24 hours: Temp:  [97.6 F (36.4 C)-98.5 F (36.9 C)] 97.6 F (36.4 C) (08/23 0521) Pulse Rate:  [64-97] 97 (08/23 0521) Resp:  [18-20] 20 (08/23 0521) BP: (109-141)/(69-89) 134/89 (08/23 0521) SpO2:  [94 %-98 %] 97 % (08/23 0521) Last BM Date : 09/02/21 General:   Alert and oriented, pleasant Head:  Normocephalic and atraumatic. Eyes:  No icterus, sclera clear. Conjuctiva pink.  Mouth:  Without lesions, mucosa pink and moist.   Heart:  S1, S2 present, no murmurs noted.  Lungs: Clear to auscultation bilaterally, without wheezing, rales, or rhonchi.  Abdomen:  Bowel sounds present, soft, non-tender, non-distended. No HSM or hernias noted. No rebound or guarding. No masses appreciated  Msk:  Symmetrical without gross deformities. Normal posture. Pulses:  Normal pulses noted. Extremities:  Without clubbing or edema. Neurologic:  Alert and  oriented x4;  grossly normal neurologically. Skin:  Warm and dry, intact. Some bruising present to arms  Psych:  Alert and cooperative. Normal mood and affect.  Intake/Output from previous day: 08/22 0701 - 08/23 0700 In: 480 [P.O.:480] Out: 1151 [Urine:1150; Stool:1] Intake/Output this shift: No intake/output data recorded.  Lab Results: Recent Labs    09/01/21 0435 09/02/21 0304  WBC 5.4 5.1  HGB 8.6* 8.0*  HCT 24.7* 22.8*  PLT 102* 102*   BMET Recent Labs    09/01/21 0435 09/02/21 0304 09/03/21 0401  NA 126* 128* 131*  K 3.8 3.6 3.3*  CL 96* 98 99  CO2 21* 23 26  GLUCOSE 96 92 101*  BUN '22 17 12  '$ CREATININE 1.16 1.17 1.03  CALCIUM 5.3* 5.4* 5.3*    No results  found.  Assessment: Paul Bradley is a 76 y.o. male with a history of BPH, papillary thyroid cancer, A-fib on Eliquis, diabetes, coronary artery disease, osteomyelitis, hypertension, who came to the hospital presenting with worsening lightheadedness and fatigue.  Admitted 8/19 with shock in setting of hypovolemia, concern for blood loss; Gastroenterology was consulted as the patient presented significant drop in his hemoglobin down to 8.7 with positive FOBT  Anemia: hgb 8.7 on admission with some fluctuation though down to 8 yesterday, FOBT positive. No overt GI bleeding or c/o GI symptoms, unsure of source of anemia at this time. Last TCS April 2020 at [facility] with diverticulosis, no previous EGD. Our team has recommended EGD and Colonoscopy this admission once electrolytes are corrected. Sodium is 131 today, potassium 3.3 but calcium still significantly low at 5.3, therefore endoscopic procedures will continue to be held until these are improved. patient feels well, denies any sob, dizziness, or fatigue. No abdominal pain, nausea or vomiting. Last BM was this morning.    Plan: H&H daily, transfuse hgb <7 Continue PPI IV BID EGD and TCS once electrolytes corrected, Na 130 or higher and Ca around 9 Monitor for overt GI bleeding Continue Liquid diet   LOS: 4 days    09/03/2021, 8:46 AM   Micha Dosanjh L. Alver Sorrow, MSN, APRN, AGNP-C Adult-Gerontology Nurse Practitioner [facility]

## 2021-09-03 NOTE — Progress Notes (Signed)
  Transition of Care [facility]) Screening Note   Patient Details  Name: NICODEMUS DENK Date of Birth: 19-Feb-1945   Transition of Care [facility]) CM/SW Contact:    Ihor Gully, LCSW Phone Number: 09/03/2021, 1:19 PM    Transition of Care Department [facility]) has reviewed patient and no TOC needs have been identified at this time. We will continue to monitor patient advancement through interdisciplinary progression rounds. If new patient transition needs arise, please place a TOC consult.

## 2021-09-03 NOTE — Progress Notes (Signed)
Vital signs stable. Patient rested throughout the night. No complaints at this time.

## 2021-09-04 DIAGNOSIS — R579 Shock, unspecified: Secondary | ICD-10-CM | POA: Diagnosis not present

## 2021-09-04 DIAGNOSIS — K921 Melena: Secondary | ICD-10-CM | POA: Diagnosis not present

## 2021-09-04 LAB — CBC
HCT: 23.2 % — ABNORMAL LOW (ref 39.0–52.0)
Hemoglobin: 8 g/dL — ABNORMAL LOW (ref 13.0–17.0)
MCH: 31.1 pg (ref 26.0–34.0)
MCHC: 34.5 g/dL (ref 30.0–36.0)
MCV: 90.3 fL (ref 80.0–100.0)
Platelets: 123 10*3/uL — ABNORMAL LOW (ref 150–400)
RBC: 2.57 MIL/uL — ABNORMAL LOW (ref 4.22–5.81)
RDW: 13.9 % (ref 11.5–15.5)
WBC: 4.1 10*3/uL (ref 4.0–10.5)
nRBC: 1.5 % — ABNORMAL HIGH (ref 0.0–0.2)

## 2021-09-04 LAB — CULTURE, BLOOD (ROUTINE X 2)
Culture: NO GROWTH
Culture: NO GROWTH

## 2021-09-04 LAB — GLUCOSE, CAPILLARY
Glucose-Capillary: 84 mg/dL (ref 70–99)
Glucose-Capillary: 136 mg/dL — ABNORMAL HIGH (ref 70–99)
Glucose-Capillary: 120 mg/dL — ABNORMAL HIGH (ref 70–99)
Glucose-Capillary: 126 mg/dL — ABNORMAL HIGH (ref 70–99)

## 2021-09-04 LAB — COMPREHENSIVE METABOLIC PANEL
ALT: 30 U/L (ref 0–44)
AST: 22 U/L (ref 15–41)
Albumin: 2.4 g/dL — ABNORMAL LOW (ref 3.5–5.0)
Alkaline Phosphatase: 43 U/L (ref 38–126)
Anion gap: 8 (ref 5–15)
BUN: 9 mg/dL (ref 8–23)
CO2: 23 mmol/L (ref 22–32)
Calcium: 6.3 mg/dL — CL (ref 8.9–10.3)
Chloride: 99 mmol/L (ref 98–111)
Creatinine, Ser: 1.09 mg/dL (ref 0.61–1.24)
GFR, Estimated: >60 mL/min (ref >60)
Glucose, Bld: 102 mg/dL — ABNORMAL HIGH (ref 70–99)
Potassium: 3.5 mmol/L (ref 3.5–5.1)
Sodium: 130 mmol/L — ABNORMAL LOW (ref 135–145)
Total Bilirubin: 1.2 mg/dL (ref 0.3–1.2)
Total Protein: 5.2 g/dL — ABNORMAL LOW (ref 6.5–8.1)

## 2021-09-04 LAB — MAGNESIUM: Magnesium: 1.5 mg/dL — ABNORMAL LOW (ref 1.7–2.4)

## 2021-09-04 MED ORDER — PEG 3350-KCL-NA BICARB-NACL 420 G PO SOLR
4000.0000 mL | Freq: Once | ORAL | Status: AC
Start: 2021-09-04 — End: 2021-09-04
  Administered 2021-09-04: 4000 mL via ORAL

## 2021-09-04 MED ORDER — BISACODYL 5 MG PO TBEC
10.0000 mg | DELAYED_RELEASE_TABLET | Freq: Once | ORAL | Status: AC
  Administered 2021-09-04: 10 mg via ORAL
  Filled 2021-09-04: qty 2

## 2021-09-04 NOTE — Progress Notes (Signed)
PROGRESS NOTE  Paul Bradley NLZ:[id number] DOB: [birth year] DOA: 08/30/2021 PCP: [name], MD   LOS: 5 days   Brief Narrative / Interim history: 76 year old male with history of recurrent, metastatic iodine refractory papillary thyroid carcinoma, no longer on treatment since May, suprapubic catheter, second toe osteomyelitis currently on linezolid twice daily, DM2, HTN, chronic A-fib on Eliquis comes into the hospital with weakness.  Since he has been on linezolid over the last month, he has been having poor appetite, diarrhea, and was dehydrated several times in the last month.  He has also been complaining of significant weakness.  Hospital course complicated by progressive anemia, blood in the stool, and cardiology was consulted.  Pertinent data: Chest x-ray 8/19-trace bilateral pleural effusions Chest x-ray 8/20-no significant change from previous study  Subjective / 24h Interval events: -He is cousin at bedside, complains of fatigue and generalized weakness  Assesement and Plan: Principal Problem:   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[place]) Active Problems:   Nausea and vomiting   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 (HCC)   Blood in stool   Thyroid cancer ([place])   Diabetes mellitus without complication (HCC)   Chronic a-fib on Eliquis   Hypertension   Pressure injury of skin   Suprapubic catheter (HCC)   ABLA (acute blood loss anemia)   Abnormal blood electrolyte level   Hypocalcemia   Hyponatremia  Principal problem GI bleeding-patient was admitted with worsening anemia and fecal occult was positive.  No clear-cut overt GI bleeding and his hemoglobin drop has been over the past 6 months.  Gastroenterology consulted and following, they are planning to do endoscopic evaluation however they wish for his electrolytes to be corrected prior to anesthesia  Active problems Barbette Or the setting of hypovolemia and dehydration, also concern for a component of blood loss.  May be  related to anemia continue supportive care  Dysphagia--- speech pathology eval appreciated May need modified barium swallow  Anemia of chronic disease now with superimposed acute blood loss anemia- -GI service would like to correct electrolyte abnormalities prior to having him undergo anesthesia for endoluminal evaluation  Thrombocytopenia -new, platelets have been normal up until this hospitalization, I wonder whether it is a consumptive process.  Stable over the last 5 days, continue to monitor  Osteomyelitis of second toe left foot-status post amputation last month, has been on suppressive antibiotics with plans to stop on 8/29.  He has healed adequately and there is no sign of infection, previous hospitalist decided to hold any further treatment with Zyvox.  He remains afebrile and WBC is normal  Thyroid cancer-outpatient follow-up  Hypocalcemia-chronic, resume home calcitriol as well as vitamin D today, continue calcium repletion oral and IV,   Chronic A-fib-restarted metoprolol for rate control, Eliquis is on hold due to GI bleed  Essential hypertension-hypotensive on admission and he was started on midodrine. -Continue metoprolol for rate control if BP tolerates, consider weaning off midodrine  -Pressure injury of skin, POA-stage I left buttocks and stage II medial aspect coccyx area  Suprapubic catheter-chronic, due to be exchanged on 8/22, Dr. Dyann Kief reached out to urology based on his prior notes  Hyponatremia-possibly due to dehydration,   Scheduled Meds:  bisacodyl  10 mg Oral Once   calcitRIOL  0.5 mcg Oral Daily   calcium carbonate  1 tablet Oral BID WC   Chlorhexidine Gluconate Cloth  6 each Topical Daily   cholecalciferol  1,000 Units Oral Daily   insulin aspart  0-15 Units Subcutaneous TID WC  metoprolol succinate  25 mg Oral Daily   midodrine  10 mg Oral TID WC   ondansetron (ZOFRAN) IV  4 mg Intravenous Q12H   pantoprazole (PROTONIX) IV  40 mg Intravenous Q12H    sodium chloride flush  3 mL Intravenous Q12H   sodium chloride  1 g Oral Daily   Continuous Infusions:  ondansetron (ZOFRAN) IV Stopped (08/31/21 0937)   PRN Meds:.acetaminophen, bisacodyl, guaiFENesin-dextromethorphan, ipratropium-albuterol, morphine injection, ondansetron **OR** ondansetron (ZOFRAN) IV, mouth rinse  Diet Orders (From admission, onward)     Start     Ordered   09/05/21 0001  Diet NPO time specified  Diet effective midnight        09/04/21 1632   09/04/21 1631  Diet clear liquid Room service appropriate? Yes; Fluid consistency: Thin  Diet effective now       Question Answer Comment  Room service appropriate? Yes   Fluid consistency: Thin      09/04/21 1632            DVT prophylaxis: Place and maintain sequential compression device Start: 08/30/21 2111   Lab Results  Component Value Date   PLT 123 (L) 09/04/2021      Code Status: DNR  Family Communication: Patient's cousin is at bedside  Status is: Inpatient Remains inpatient appropriate because: Anemia, hyocalcemia, hyponatremia  Level of care: Telemetry  Consultants:  Gastroenterology  Objective: Vitals:   09/03/21 1317 09/03/21 1957 09/04/21 0707 09/04/21 1317  BP: (!) 117/92 (!) 154/74 (!) 138/103 138/88  Pulse: 74 76 63 74  Resp: '17 20 15   '$ Temp: 98.2 F (36.8 C) 98.7 F (37.1 C) (!) 96.9 F (36.1 C)   TempSrc:  Oral    SpO2: 91% 96% 95% (!) 87%  Weight:      Height:        Intake/Output Summary (Last 24 hours) at 09/04/2021 1838 Last data filed at 09/04/2021 1326 Gross per 24 hour  Intake 480 ml  Output 890 ml  Net -410 ml   Wt Readings from Last 3 Encounters:  09/01/21 96.4 kg  07/21/21 90.3 kg  02/11/21 89.8 kg    Examination:  Constitutional: NAD Eyes: no scleral icterus ENMT: Mucous membranes are moist.  Neck: normal, supple Respiratory: clear to auscultation bilaterally, no wheezing, no crackles. Normal respiratory effort. No accessory muscle use.   Cardiovascular: Regular rate and rhythm, no murmurs / rubs / gallops. No LE edema.  Abdomen: non distended, no tenderness. Bowel sounds positive.  Musculoskeletal: no clubbing / cyanosis.  Skin: no rashes Neurologic: non focal   Data Reviewed: I have independently reviewed following labs and imaging studies   CBC Recent Labs  Lab 08/30/21 1338 08/30/21 2242 08/31/21 0551 09/01/21 0435 09/02/21 0304 09/04/21 0336  WBC 7.1 7.3 5.7 5.4 5.1 4.1  HGB 8.7* 8.9* 9.0* 8.6* 8.0* 8.0*  HCT 25.4* 25.8* 26.1* 24.7* 22.8* 23.2*  PLT 105* 103* 103* 102* 102* 123*  MCV 91.0 92.5 90.9 91.8 91.2 90.3  MCH 31.2 31.9 31.4 32.0 32.0 31.1  MCHC 34.3 34.5 34.5 34.8 35.1 34.5  RDW 13.7 13.9 13.9 14.3 14.3 13.9  LYMPHSABS 1.1  --   --   --   --   --   MONOABS 0.7  --   --   --   --   --   EOSABS 0.0  --   --   --   --   --   BASOSABS 0.0  --   --   --   --   --  Recent Labs  Lab 08/30/21 1338 08/30/21 1517 08/30/21 2242 08/31/21 0551 09/01/21 0435 09/02/21 0304 09/03/21 0401 09/03/21 1635 09/04/21 0336  NA 122*  --   --  125* 126* 128* 131* 130* 130*  K 4.1  --   --  4.6 3.8 3.6 3.3* 3.7 3.5  CL 87*  --   --  95* 96* 98 99 97* 99  CO2 23  --   --  22 21* '23 26 26 23  '$ GLUCOSE 141*  --   --  115* 96 92 101* 112* 102*  BUN 21  --   --  '22 22 17 12 10 9  '$ CREATININE 1.22  --   --  1.17 1.16 1.17 1.03 1.06 1.09  CALCIUM 6.0*  --   --  5.6* 5.3* 5.4* 5.3* 5.8* 6.3*  AST 26  --   --  21  --   --   --  27 22  ALT 32  --   --  29  --   --   --  31 30  ALKPHOS 44  --   --  39  --   --   --  48 43  BILITOT 0.7  --   --  1.2  --   --   --  1.2 1.2  ALBUMIN 2.8*  --   --  2.7*  --   --   --  2.5* 2.4*  MG  --   --   --   --   --   --   --   --  1.5*  CRP 3.9*  --   --   --   --   --   --   --   --   PROCALCITON 0.17  --   --   --   --   --   --   --   --   LATICACIDVEN 3.3* 2.2* 2.7* 1.9  --   --   --   --   --   INR 1.9*  --   --   --   --   --   --   --   --   HGBA1C  --   --   --  6.1*   --   --   --   --   --     ------------------------------------------------------------------------------------------------------------------ No results for input(s): "CHOL", "HDL", "LDLCALC", "TRIG", "CHOLHDL", "LDLDIRECT" in the last 72 hours.  Lab Results  Component Value Date   HGBA1C 6.1 (H) 08/31/2021   ------------------------------------------------------------------------------------------------------------------ No results for input(s): "TSH", "T4TOTAL", "T3FREE", "THYROIDAB" in the last 72 hours.  Invalid input(s): "FREET3"  Cardiac Enzymes No results for input(s): "CKMB", "TROPONINI", "MYOGLOBIN" in the last 168 hours.  Invalid input(s): "CK" ------------------------------------------------------------------------------------------------------------------ No results found for: "BNP"  CBG: Recent Labs  Lab 09/03/21 1646 09/03/21 2000 09/04/21 0807 09/04/21 1106 09/04/21 1607  GLUCAP 109* 238* 84 136* 120*    Recent Results (from the past 240 hour(s))  Blood Culture (routine x 2)     Status: None   Collection Time: 08/30/21  1:38 PM   Specimen: BLOOD  Result Value Ref Range Status   Specimen Description BLOOD BLOOD LEFT HAND  Final   Special Requests   Final    BOTTLES DRAWN AEROBIC AND ANAEROBIC Blood Culture results may not be optimal due to an inadequate volume of blood received in culture bottles   Culture   Final    NO GROWTH 5 DAYS  Performed at [facility], [address] [place], [place] [address]    Report Status 09/04/2021 FINAL  Final  Urine Culture     Status: Abnormal   Collection Time: 08/30/21  1:38 PM   Specimen: Urine, Suprapubic  Result Value Ref Range Status   Specimen Description   Final    URINE, SUPRAPUBIC Performed at [facility], [address], [place], [place] [address]    Special Requests   Final    NONE Performed at [facility], [address] [place], [place] [address]    Culture (A)  Final    >=100,000 COLONIES/mL  KLEBSIELLA OXYTOCA 80,000 COLONIES/mL PSEUDOMONAS AERUGINOSA    Report Status 09/02/2021 FINAL  Final   Organism ID, Bacteria KLEBSIELLA OXYTOCA (A)  Final   Organism ID, Bacteria PSEUDOMONAS AERUGINOSA (A)  Final      Susceptibility   Klebsiella oxytoca - MIC*    AMPICILLIN RESISTANT Resistant     CEFAZOLIN <=4 SENSITIVE Sensitive     CEFEPIME <=0.12 SENSITIVE Sensitive     CEFTRIAXONE <=0.25 SENSITIVE Sensitive     CIPROFLOXACIN <=0.25 SENSITIVE Sensitive     GENTAMICIN <=1 SENSITIVE Sensitive     IMIPENEM <=0.25 SENSITIVE Sensitive     NITROFURANTOIN 64 INTERMEDIATE Intermediate     TRIMETH/SULFA <=20 SENSITIVE Sensitive     AMPICILLIN/SULBACTAM 8 SENSITIVE Sensitive     PIP/TAZO 16 SENSITIVE Sensitive     * >=100,000 COLONIES/mL KLEBSIELLA OXYTOCA   Pseudomonas aeruginosa - MIC*    CEFTAZIDIME <=1 SENSITIVE Sensitive     CIPROFLOXACIN 0.5 SENSITIVE Sensitive     GENTAMICIN 4 SENSITIVE Sensitive     IMIPENEM 1 SENSITIVE Sensitive     PIP/TAZO <=4 SENSITIVE Sensitive     * 80,000 COLONIES/mL PSEUDOMONAS AERUGINOSA  Blood Culture (routine x 2)     Status: None   Collection Time: 08/30/21  2:18 PM   Specimen: BLOOD  Result Value Ref Range Status   Specimen Description BLOOD BLOOD RIGHT HAND  Final   Special Requests   Final    BOTTLES DRAWN AEROBIC AND ANAEROBIC Blood Culture results may not be optimal due to an excessive volume of blood received in culture bottles   Culture   Final    NO GROWTH 5 DAYS Performed at [facility], [address], [place], [place] [address]    Report Status 09/04/2021 FINAL  Final  SARS Coronavirus 2 by RT PCR (hospital order, performed in [facility] hospital lab) *cepheid single result test* Anterior Nasal Swab     Status: None   Collection Time: 08/30/21  4:29 PM   Specimen: Anterior Nasal Swab  Result Value Ref Range Status   SARS Coronavirus 2 by RT PCR NEGATIVE NEGATIVE Final    Comment: (NOTE) SARS-CoV-2 target nucleic acids are NOT  DETECTED.  The SARS-CoV-2 RNA is generally detectable in upper and lower respiratory specimens during the acute phase of infection. The lowest concentration of SARS-CoV-2 viral copies this assay can detect is 250 copies / mL. A negative result does not preclude SARS-CoV-2 infection and should not be used as the sole basis for treatment or other patient management decisions.  A negative result may occur with improper specimen collection / handling, submission of specimen other than nasopharyngeal swab, presence of viral mutation(s) within the areas targeted by this assay, and inadequate number of viral copies (<250 copies / mL). A negative result must be combined with clinical observations, patient history, and epidemiological information.  Fact Sheet for Patients:  https://www.patel.info/  Fact Sheet for Healthcare Providers: https://hall.com/  This test is not yet approved or  cleared by the [place] FDA and has been authorized for detection and/or diagnosis of SARS-CoV-2 by FDA under an Emergency Use Authorization (EUA).  This EUA will remain in effect (meaning this test can be used) for the duration of the COVID-19 declaration under Section 564(b)(1) of the Act, 21 U.S.C. section 360bbb-3(b)(1), unless the authorization is terminated or revoked sooner.  Performed at [facility], [address], [place], [place] [address]   MRSA Next Gen by PCR, Nasal     Status: None   Collection Time: 08/30/21  5:48 PM   Specimen: Urine, Suprapubic; Nasal Swab  Result Value Ref Range Status   MRSA by PCR Next Gen NOT DETECTED NOT DETECTED Final    Comment: (NOTE) The GeneXpert MRSA Assay (FDA approved for NASAL specimens only), is one component of a comprehensive MRSA colonization surveillance program. It is not intended to diagnose MRSA infection nor to guide or monitor treatment for MRSA infections. Test performance is not FDA approved in  patients less than 70 years old. Performed at [facility], [address] [place], [place] [address]      Roxan Hockey, MD  [facility]  Between 7 am - 7 pm I am available, please contact me via [place] (for emergencies) or Securechat (non urgent messages)  Between 7 pm - 7 am I am not available, please contact night coverage MD/APP via Amion

## 2021-09-04 NOTE — Progress Notes (Addendum)
Gastroenterology Progress Note   Referring Provider: No ref. provider found Primary Care Physician:  [name], [name], MD Primary Gastroenterologist:  GI at [facility]  Patient ID: Paul Bradley; [id number]; 1945-11-17   Subjective:    Patient resting.  States he is feels good.  Had 2 BMs this morning, nonbloody.  No black stools.  Seen by speech therapy for bedside swallowing evaluation.  Objective:   Vital signs in last 24 hours: Temp:  [96.9 F (36.1 C)-98.7 F (37.1 C)] 96.9 F (36.1 C) (08/24 0707) Pulse Rate:  [63-76] 74 (08/24 1317) Resp:  [15-20] 15 (08/24 0707) BP: (138-154)/(74-103) 138/88 (08/24 1317) SpO2:  [87 %-96 %] 87 % (08/24 1317) Last BM Date : 09/02/21 General:   Alert, elderly male, well-developed, well-nourished, pleasant and cooperative in NAD Head:  Normocephalic and atraumatic. Eyes:  Sclera clear, no icterus.  Abdomen:  Soft, nontender and nondistended.  Normal bowel sounds, without guarding, and without rebound.   Extremities:  Without clubbing, deformity or edema. Neurologic:  Alert and  oriented x4;  grossly normal neurologically. Skin:  Intact without significant lesions or rashes. Psych:  Alert and cooperative. Normal mood and affect.  Intake/Output from previous day: 08/23 0701 - 08/24 0700 In: 814 [P.O.:800; IV Piggyback:14] Out: 1490 [Urine:1250; Blood:240] Intake/Output this shift: Total I/O In: 480 [P.O.:480] Out: -   Lab Results: CBC Recent Labs    09/02/21 0304 09/04/21 0336  WBC 5.1 4.1  HGB 8.0* 8.0*  HCT 22.8* 23.2*  MCV 91.2 90.3  PLT 102* 123*   BMET Recent Labs    09/03/21 0401 09/03/21 1635 09/04/21 0336  NA 131* 130* 130*  K 3.3* 3.7 3.5  CL 99 97* 99  CO2 '26 26 23  '$ GLUCOSE 101* 112* 102*  BUN '12 10 9  '$ CREATININE 1.03 1.06 1.09  CALCIUM 5.3* 5.8* 6.3*   LFTs Recent Labs    09/03/21 1635 09/04/21 0336  BILITOT 1.2 1.2  ALKPHOS 48 43  AST 27 22  ALT 31 30  PROT 5.7* 5.2*  ALBUMIN 2.5* 2.4*    No results for input(s): "LIPASE" in the last 72 hours. PT/INR No results for input(s): "LABPROT", "INR" in the last 72 hours.       Imaging Studies: DG Chest 2 View  Result Date: 08/31/2021 CLINICAL DATA:  Short of breath.  Follow-up study. EXAM: CHEST - 2 VIEW COMPARISON:  08/30/2021 and older exams. FINDINGS: Stable cardiomegaly. Persistent lung base opacities, right greater than left, consistent with small effusions and atelectasis. Remainder of the lungs is clear. No pneumothorax. IMPRESSION: 1. No significant change from the previous day's study. 2. Lung base opacities consistent with a combination of pleural effusions and atelectasis. Stable cardiomegaly. Electronically Signed   By: Lajean Manes M.D.   On: 08/31/2021 10:53   DG Chest Port 1 View  Result Date: 08/30/2021 CLINICAL DATA:  Questionable sepsis EXAM: PORTABLE CHEST 1 VIEW COMPARISON:  06/05/2018 FINDINGS: No focal consolidation. Trace bilateral pleural effusions. No pneumothorax. Stable cardiomegaly. No acute osseous abnormality. IMPRESSION: 1. Trace bilateral pleural effusions. Electronically Signed   By: Kathreen Devoid M.D.   On: 08/30/2021 14:36  [2 weeks]  Assessment:   76 y.o. male with a history of BPH, papillary thyroid cancer, A-fib on Eliquis, diabetes, coronary artery disease, osteomyelitis, hypertension, who came to the hospital presenting with worsening lightheadedness and fatigue.  Admitted 8/19 with shock in setting of hypovolemia, concern for blood loss; Gastroenterology was consulted as the patient presented significant drop  in his hemoglobin down to 8.7 with positive FOBT.  Anemia: Hgb 8.7 on admission (had been 13.4 in July at outside facility).Hgb 8 today. Received 2 units of blood on 08/30/21. Ifobt positive. No overt GI bleeding. Last TCS April 2020 at [facility] with diverticulosis, no previous EGD. Procedures on hold until electrolytes corrected. Calcium 6.3 today, corrected calcium 7.6. Sodium 130. Magnesium  1.5.  Dysphagia: Seen by speech therapy for bedside evaluation today.  Patient with wet vocal quality and occasional coughing after drinking liquids.  Modified barium swallow study recommended.    Plan:   Continue IV PPI BID.  Monitor for overt GI bleeding. Continue full liquid diet.  Follow electrolytes. May be ready to prep tomorrow pending labs. To discuss with Dr. Jenetta Downer.  MBSS planned, if bowel prep started tomorrow, solids foods should not be given at time of MBSS.   LOS: 5 days   [name]. [name] [facility] [phone] 8/24/20231:33 PM   Addendum: Discussed with Dr. Jenetta Downer, start bowel prep tonight. Plan for colonoscopy/EGD in the morning. Recheck labs in am.  [name]. [name] [facility] [phone] 8/24/20234:29 PM

## 2021-09-04 NOTE — Plan of Care (Signed)
Patient repositioned at 90 degree for comfort, encouraged bowel prep for elimination, noted patient swallowing with any coughing with nectar thickened liquids, speech therapy consult requested.  Patient resting quietly with call bell in reach.  Problem: Education: Goal: Knowledge of disease or condition will improve Outcome: Progressing Goal: Understanding of medication regimen will improve Outcome: Progressing Goal: Individualized Educational Video(s) Outcome: Progressing   Problem: Activity: Goal: Ability to tolerate increased activity will improve Outcome: Progressing   Problem: Cardiac: Goal: Ability to achieve and maintain adequate cardiopulmonary perfusion will improve Outcome: Progressing   Problem: Health Behavior/Discharge Planning: Goal: Ability to safely manage health-related needs after discharge will improve Outcome: Progressing   Problem: Education: Goal: Knowledge of General Education information will improve Description: Including pain rating scale, medication(s)/side effects and non-pharmacologic comfort measures Outcome: Progressing   Problem: Health Behavior/Discharge Planning: Goal: Ability to manage health-related needs will improve Outcome: Progressing   Problem: Clinical Measurements: Goal: Ability to maintain clinical measurements within normal limits will improve Outcome: Progressing Goal: Will remain free from infection Outcome: Progressing Goal: Diagnostic test results will improve Outcome: Progressing Goal: Respiratory complications will improve Outcome: Progressing Goal: Cardiovascular complication will be avoided Outcome: Progressing   Problem: Activity: Goal: Risk for activity intolerance will decrease Outcome: Progressing   Problem: Nutrition: Goal: Adequate nutrition will be maintained Outcome: Progressing   Problem: Coping: Goal: Level of anxiety will decrease Outcome: Progressing   Problem: Elimination: Goal: Will not  experience complications related to bowel motility Outcome: Progressing Goal: Will not experience complications related to urinary retention Outcome: Progressing   Problem: Pain Managment: Goal: General experience of comfort will improve Outcome: Progressing   Problem: Safety: Goal: Ability to remain free from injury will improve Outcome: Progressing   Problem: Skin Integrity: Goal: Risk for impaired skin integrity will decrease Outcome: Progressing

## 2021-09-04 NOTE — Evaluation (Signed)
Clinical/Bedside Swallow Evaluation Patient Details  Name: Paul Bradley MRN: [id number] Date of Birth: [birth year]  Today's Date: 09/04/2021 Time: SLP Start Time (ACUTE ONLY): 1000 SLP Stop Time (ACUTE ONLY): 1024 SLP Time Calculation (min) (ACUTE ONLY): 24 min  Past Medical History:  Past Medical History:  Diagnosis Date   Abscess of left hip    "healing wound" per [facility] Heme/Onc MD note 06/03/18   Assistance needed for mobility    walker   Atrial fibrillation ([place])    BPH (benign prostatic hyperplasia)    Cancer ([employer])    thyroid   Chronic anticoagulation    Coronary artery disease    Diabetes mellitus without complication ([place])    Hematochezia 05/05/2018   colonoscopy at [facility] with diverticulosis    Hypertension    Thyroid disease    Past Surgical History:  Past Surgical History:  Procedure Laterality Date   THYROIDECTOMY     HPI:  76 year old male with history of recurrent, metastatic iodine refractory papillary thyroid carcinoma, no longer on treatment since May, suprapubic catheter, second toe osteomyelitis currently on linezolid twice daily, DM2, HTN, chronic A-fib on Eliquis comes into the hospital with weakness.  Since he has been on linezolid over the last month, he has been having poor appetite, diarrhea, and was dehydrated several times in the last month.  He has also been complaining of significant weakness.  Hospital course complicated by progressive anemia, blood in the stool, and cardiology was consulted. GI following and Pt on liquid diet. Chest x-ray 8/19-trace bilateral pleural effusions and no significant change with chest xray 8/20. BSE requested.    Assessment / Plan / Recommendation  Clinical Impression  Clinical swallow evaluation completed at bedside. Pt with mild wet vocal quality upon arrival. He denies difficulty swallowing but does endorse coughing and h/o PNA. Pt currently on full liquids due to progressive anemia with blood in the stool. GI plans  to complete EGD once electrolytes normalize. Pt with h/o metastatic thyroid carcinoma, unsure of current status. Pt does present with wet vocal quality and occasional coughing after drinking liquids. It may be prudent to pursue instrumental study while in acute setting via MBSS if this does not conflict with GI plans for EGD. SLP check Pt in the AM and will try to complete MBSS if available. Can continue diet as ordered.   SLP Visit Diagnosis: Dysphagia, unspecified (R13.10)    Aspiration Risk  Mild aspiration risk    Diet Recommendation  (ok to continue diet as ordered- full liquids per GI)   Liquid Administration via: Cup;Straw Medication Administration: Whole meds with liquid Supervision: Patient able to self feed Compensations: Small sips/bites Postural Changes: Seated upright at 90 degrees;Remain upright for at least 30 minutes after po intake    Other  Recommendations Oral Care Recommendations: Oral care BID Other Recommendations: Clarify dietary restrictions    Recommendations for follow up therapy are one component of a multi-disciplinary discharge planning process, led by the attending physician.  Recommendations may be updated based on patient status, additional functional criteria and insurance authorization.  Follow up Recommendations Follow physician's recommendations for discharge plan and follow up therapies      Assistance Recommended at Discharge PRN  Functional Status Assessment Patient has had a recent decline in their functional status and demonstrates the ability to make significant improvements in function in a reasonable and predictable amount of time.  Frequency and Duration min 2x/week  1 week       Prognosis Prognosis for  Safe Diet Advancement: Good      Swallow Study   General Date of Onset: 08/30/21 HPI: 76 year old male with history of recurrent, metastatic iodine refractory papillary thyroid carcinoma, no longer on treatment since May, suprapubic  catheter, second toe osteomyelitis currently on linezolid twice daily, DM2, HTN, chronic A-fib on Eliquis comes into the hospital with weakness.  Since he has been on linezolid over the last month, he has been having poor appetite, diarrhea, and was dehydrated several times in the last month.  He has also been complaining of significant weakness.  Hospital course complicated by progressive anemia, blood in the stool, and cardiology was consulted. GI following and Pt on liquid diet. Chest x-ray 8/19-trace bilateral pleural effusions and no significant change with chest xray 8/20. BSE requested. Type of Study: Bedside Swallow Evaluation Previous Swallow Assessment: N/A Diet Prior to this Study:  (full liquids) Temperature Spikes Noted: No Respiratory Status: Room air History of Recent Intubation: No Behavior/Cognition: Alert;Cooperative;Pleasant mood Oral Cavity Assessment: Within Functional Limits Oral Care Completed by SLP: Recent completion by staff Oral Cavity - Dentition: Missing dentition Vision: Functional for self-feeding Self-Feeding Abilities: Able to feed self Patient Positioning: Upright in bed Baseline Vocal Quality: Wet Volitional Cough: Strong;Congested Volitional Swallow: Able to elicit    Oral/Motor/Sensory Function Overall Oral Motor/Sensory Function: Within functional limits   Ice Chips Ice chips: Within functional limits Presentation: Spoon   Thin Liquid Thin Liquid: Impaired Presentation: Cup;Straw;Self Fed Pharyngeal  Phase Impairments: Cough - Delayed    Nectar Thick Nectar Thick Liquid: Not tested   Honey Thick Honey Thick Liquid: Not tested   Puree Puree: Within functional limits   Solid     Solid: Not tested Presentation:  (Pt on full liquids)     Thank you,  Genene Churn, [place]  Zniyah Midkiff 09/04/2021,10:36 AM

## 2021-09-05 ENCOUNTER — Inpatient Hospital Stay (HOSPITAL_COMMUNITY): Payer: Medicare HMO

## 2021-09-05 ENCOUNTER — Encounter (HOSPITAL_COMMUNITY)
Admission: EM | Disposition: A | Discharge status: Skilled Nursing Facility | Payer: Self-pay | Source: Home / Self Care | Attending: Family Medicine

## 2021-09-05 DIAGNOSIS — R579 Shock, unspecified: Secondary | ICD-10-CM | POA: Diagnosis not present

## 2021-09-05 LAB — CBC WITH DIFFERENTIAL/PLATELET
Abs Immature Granulocytes: 0.1 10*3/uL — ABNORMAL HIGH (ref 0.00–0.07)
Basophils Absolute: 0 10*3/uL (ref 0.0–0.1)
Basophils Relative: 0 %
Eosinophils Absolute: 0 10*3/uL (ref 0.0–0.5)
Eosinophils Relative: 0 %
HCT: 25.2 % — ABNORMAL LOW (ref 39.0–52.0)
Hemoglobin: 8.6 g/dL — ABNORMAL LOW (ref 13.0–17.0)
Immature Granulocytes: 1 %
Lymphocytes Relative: 7 %
Lymphs Abs: 0.7 10*3/uL (ref 0.7–4.0)
MCH: 31.5 pg (ref 26.0–34.0)
MCHC: 34.1 g/dL (ref 30.0–36.0)
MCV: 92.3 fL (ref 80.0–100.0)
Monocytes Absolute: 1.3 10*3/uL — ABNORMAL HIGH (ref 0.1–1.0)
Monocytes Relative: 14 %
Neutro Abs: 7.4 10*3/uL (ref 1.7–7.7)
Neutrophils Relative %: 78 %
Platelets: 161 10*3/uL (ref 150–400)
RBC: 2.73 MIL/uL — ABNORMAL LOW (ref 4.22–5.81)
RDW: 14.1 % (ref 11.5–15.5)
WBC: 9.6 10*3/uL (ref 4.0–10.5)
nRBC: 0.8 % — ABNORMAL HIGH (ref 0.0–0.2)

## 2021-09-05 LAB — MAGNESIUM: Magnesium: 1.4 mg/dL — ABNORMAL LOW (ref 1.7–2.4)

## 2021-09-05 LAB — BASIC METABOLIC PANEL
Anion gap: 9 (ref 5–15)
BUN: 7 mg/dL — ABNORMAL LOW (ref 8–23)
CO2: 25 mmol/L (ref 22–32)
Calcium: 6.3 mg/dL — CL (ref 8.9–10.3)
Chloride: 95 mmol/L — ABNORMAL LOW (ref 98–111)
Creatinine, Ser: 1 mg/dL (ref 0.61–1.24)
GFR, Estimated: >60 mL/min (ref >60)
Glucose, Bld: 112 mg/dL — ABNORMAL HIGH (ref 70–99)
Potassium: 3.4 mmol/L — ABNORMAL LOW (ref 3.5–5.1)
Sodium: 129 mmol/L — ABNORMAL LOW (ref 135–145)

## 2021-09-05 LAB — GLUCOSE, CAPILLARY
Glucose-Capillary: 104 mg/dL — ABNORMAL HIGH (ref 70–99)
Glucose-Capillary: 93 mg/dL (ref 70–99)
Glucose-Capillary: 115 mg/dL — ABNORMAL HIGH (ref 70–99)

## 2021-09-05 LAB — BLOOD GAS, ARTERIAL
Acid-base deficit: 0.2 mmol/L (ref 0.0–2.0)
Bicarbonate: 25.9 mmol/L (ref 20.0–28.0)
Drawn by: 23430
FIO2: 28 %
O2 Saturation: 96.2 %
Patient temperature: 36.9
pCO2 arterial: 47 mmHg (ref 32–48)
pH, Arterial: 7.35 (ref 7.35–7.45)
pO2, Arterial: 72 mmHg — ABNORMAL LOW (ref 83–108)

## 2021-09-05 SURGERY — COLONOSCOPY WITH PROPOFOL
Anesthesia: Choice

## 2021-09-05 MED ORDER — BISACODYL 5 MG PO TBEC
10.0000 mg | DELAYED_RELEASE_TABLET | Freq: Once | ORAL | Status: AC
  Administered 2021-09-05: 10 mg via ORAL
  Filled 2021-09-05: qty 2

## 2021-09-05 MED ORDER — IPRATROPIUM-ALBUTEROL 0.5-2.5 (3) MG/3ML IN SOLN
3.0000 mL | Freq: Once | RESPIRATORY_TRACT | Status: AC
  Administered 2021-09-05: 3 mL via RESPIRATORY_TRACT
  Filled 2021-09-05: qty 3

## 2021-09-05 MED ORDER — PEG 3350-KCL-NA BICARB-NACL 420 G PO SOLR
4000.0000 mL | Freq: Once | ORAL | Status: AC
  Administered 2021-09-05: 4000 mL via ORAL

## 2021-09-05 MED ORDER — MAGNESIUM SULFATE 4 GM/100ML IV SOLN
4.0000 g | Freq: Once | INTRAVENOUS | Status: AC
  Administered 2021-09-05: 4 g via INTRAVENOUS
  Filled 2021-09-05: qty 100

## 2021-09-05 MED ORDER — PEG 3350-KCL-NA BICARB-NACL 420 G PO SOLR: 4000.0000 mL | Freq: Once | ORAL | Status: DC

## 2021-09-05 MED ORDER — METOPROLOL TARTRATE 5 MG/5ML IV SOLN
5.0000 mg | INTRAVENOUS | Status: DC | PRN
Start: 2021-09-05 — End: 2021-09-09

## 2021-09-05 MED ORDER — ALBUTEROL SULFATE (2.5 MG/3ML) 0.083% IN NEBU
INHALATION_SOLUTION | RESPIRATORY_TRACT | Status: AC
  Administered 2021-09-05: 2.5 mg
  Filled 2021-09-05: qty 3

## 2021-09-05 MED ORDER — LABETALOL HCL 5 MG/ML IV SOLN
10.0000 mg | INTRAVENOUS | Status: DC | PRN
  Administered 2021-09-05: 10 mg via INTRAVENOUS
  Filled 2021-09-05: qty 4

## 2021-09-05 MED ORDER — CALCIUM GLUCONATE-NACL 1-0.675 GM/50ML-% IV SOLN
1.0000 g | Freq: Once | INTRAVENOUS | Status: AC
  Administered 2021-09-05: 1000 mg via INTRAVENOUS
  Filled 2021-09-05: qty 50

## 2021-09-05 MED ORDER — POTASSIUM CHLORIDE 10 MEQ/100ML IV SOLN
10.0000 meq | INTRAVENOUS | Status: AC
  Administered 2021-09-05 (×4): 10 meq via INTRAVENOUS
  Filled 2021-09-05 (×4): qty 100

## 2021-09-05 NOTE — Progress Notes (Signed)
Placed patient on 2L oxygen via [place]. Patient's oxygen sats were 89-90% on room air.  With oxygen his sats were 95%

## 2021-09-05 NOTE — Progress Notes (Signed)
AT 1300 hr, RT called to Nasotracheal suction patient. Patient possibly aspirated. RT suctioned patient with 14 french suction catheter in Left nare. Patient tolerated well, RN at bedside. Patient had audible wheeze; wereas breathing treatment was given with '5mg'$  albuterol and 0.'5mg'$  atrovent. Patient stated ' he feels much better '. Patient was placed on 2L O2 and maintaining SATs 96%. RT will continue to monitor and asses.

## 2021-09-05 NOTE — Progress Notes (Signed)
Patient was not given full prep to drink yesterday. He was given his enemas this morning but continued to have liquid brown stool with small pieces of formed stool.   I discussed with nursing this morning the need to complete full prep as well as the patient and he understands. Patient reports he drank what was given to him and he would have continued to drink what was needed if given. We will re prep today and I have given him a clear liquid diet. We will proceed with EGD/Colonoscopy tomorrow with Dr. Gala Romney. Endo is aware and AC is aware.  Corrected calcium 7.6 today. Denies any GI bleeding today.    If seen by speech today for MBSS please do not give any solid food.   Clear liquids today Prep today NPO at midnight H/H and BMP in the AM  Venetia Night, MSN, FNP-BC, AGACNP-BC [facility]

## 2021-09-05 NOTE — Progress Notes (Signed)
Second tap water enema given. Patient tolerated well.  Patient is still having brown watery stools

## 2021-09-05 NOTE — Progress Notes (Signed)
PROGRESS NOTE  Paul Bradley LHT:[id number] DOB: 09-Jun-1945 DOA: 08/30/2021 PCP: [name], MD   LOS: 6 days   Brief Narrative / Interim history: 76 year old male with history of recurrent, metastatic iodine refractory papillary thyroid carcinoma, no longer on treatment since May, suprapubic catheter, second toe osteomyelitis currently on linezolid twice daily, DM2, HTN, chronic A-fib on Eliquis comes into the hospital with weakness.  Since he has been on linezolid over the last month, he has been having poor appetite, diarrhea, and was dehydrated several times in the last month.  He has also been complaining of significant weakness.  Hospital course complicated by progressive anemia, blood in the stool, and cardiology was consulted.  Pertinent data: Chest x-ray 8/19-trace bilateral pleural effusions Chest x-ray 8/20-no significant change from previous study  Subjective / 24h Interval events: -Patient's sister is at bedside -Questions answered -Possible aspiration episode with respiratory distress and hypoxia--- chest x-ray with persistent effusion but no evidence of pneumonia -Respiratory status improved with deep suctioning and neb treatments  Assesement and Plan: Principal Problem:   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[place]) Active Problems:   Nausea and vomiting   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 (HCC)   Blood in stool   Thyroid cancer ([name])   Diabetes mellitus without complication (HCC)   Chronic a-fib on Eliquis   Hypertension   Pressure injury of skin   Suprapubic catheter (H[facility])   ABLA (acute blood loss anemia)   Abnormal blood electrolyte level   Hypocalcemia   Hyponatremia  Principal problem GI bleeding-patient was admitted with worsening anemia and fecal occult was positive.  No clear-cut overt GI bleeding and his hemoglobin drop has been over the past 6 months.  Gastroenterology consulted and following, they are planning to do endoscopic evaluation however they wish  for his electrolytes to be corrected prior to anesthesia -Plans for EGD and colonoscopy on 09/06/2021  Active problems -Dysphagia and concerns about possible aspiration--- speech pathology eval appreciated -Awaiting EGD on 09/06/2021,  -We will most likely need full MBBS  Generalized weakness-in the setting of hypovolemia and dehydration, also concern for a component of blood loss.  May be related to anemia continue supportive care  Anemia of chronic disease now with superimposed acute blood loss anemia- -GI service would like to correct electrolyte abnormalities prior to having him undergo anesthesia for endoluminal evaluation  Thrombocytopenia -new, platelets have been normal up until this hospitalization, I wonder whether it is a consumptive process.  Stable over the last 5 days, continue to monitor  Osteomyelitis of second toe left foot-status post amputation last month, has been on suppressive antibiotics with plans to stop on 8/29.  He has healed adequately and there is no sign of infection, previous hospitalist decided to hold any further treatment with Zyvox.  He remains afebrile and WBC is normal  Thyroid cancer-outpatient follow-up  Hypocalcemia-chronic, resume home calcitriol as well as vitamin D today, continue calcium repletion oral and IV,   Chronic A-fib-restarted metoprolol for rate control, Eliquis is on hold due to GI bleed  Essential hypertension-hypotension has resolved, midodrine discontinued -Continue metoprolol for rate control if BP tolerates,   -Pressure injury of skin, POA-stage I left buttocks and stage II medial aspect coccyx area  Suprapubic catheter-chronic, due to be exchanged on 8/22, Dr. Dyann Kief reached out to urology based on his prior notes  Hyponatremia/hypokalemia/hypomagnesemia-hydrate and replace electrolytes  Scheduled Meds:  calcitRIOL  0.5 mcg Oral Daily   calcium carbonate  1 tablet Oral BID WC  Chlorhexidine Gluconate Cloth  6 each Topical  Daily   cholecalciferol  1,000 Units Oral Daily   insulin aspart  0-15 Units Subcutaneous TID WC   metoprolol succinate  25 mg Oral Daily   midodrine  10 mg Oral TID WC   ondansetron (ZOFRAN) IV  4 mg Intravenous Q12H   pantoprazole (PROTONIX) IV  40 mg Intravenous Q12H   sodium chloride flush  3 mL Intravenous Q12H   sodium chloride  1 g Oral Daily   Continuous Infusions:  ondansetron (ZOFRAN) IV Stopped (08/31/21 0937)   PRN Meds:.acetaminophen, bisacodyl, guaiFENesin-dextromethorphan, ipratropium-albuterol, labetalol, morphine injection, ondansetron **OR** ondansetron (ZOFRAN) IV, mouth rinse  Diet Orders (From admission, onward)     Start     Ordered   09/06/21 0001  Diet NPO time specified Except for: Sips with Meds  Diet effective midnight       Question:  Except for  Answer:  Sips with Meds   09/05/21 1836   09/05/21 0913  Diet clear liquid Room service appropriate? Yes; Fluid consistency: Thin  Diet effective now       Question Answer Comment  Room service appropriate? Yes   Fluid consistency: Thin      09/05/21 0912           DVT prophylaxis: Place and maintain sequential compression device Start: 08/30/21 2111   Lab Results  Component Value Date   PLT 161 09/05/2021     Code Status: DNR  Family Communication: Patient's cousin is at bedside  Status is: Inpatient Remains inpatient appropriate because: Anemia, hyocalcemia, hyponatremia  Level of care: Telemetry  Consultants:  Gastroenterology  Objective: Vitals:   09/05/21 1302 09/05/21 1321 09/05/21 1423 09/05/21 1607  BP: (!) 161/103  116/73 115/67  Pulse:   62 100  Resp:   20 20  Temp:   98.6 F (37 C)   TempSrc:      SpO2:  96% 96% 100%  Weight:      Height:        Intake/Output Summary (Last 24 hours) at 09/05/2021 1838 Last data filed at 09/05/2021 1700 Gross per 24 hour  Intake 300 ml  Output 1100 ml  Net -800 ml   Wt Readings from Last 3 Encounters:  09/01/21 96.4 kg  07/21/21  90.3 kg  02/11/21 89.8 kg   Physical Exam  Gen:- Awake Alert, in no acute distress  HEENT:- [place].AT, No sclera icterus Nose-  2L/min Neck-Supple Neck,No JVD,.  Lungs-diminished breath sounds, wheezing improved after neb treatment  CV- S1, S2 normal, RRR Abd-  +ve B.Sounds, Abd Soft, No tenderness,    Extremity/Skin:- trace  edema,   good pedal pulses  Psych-affect is appropriate, oriented x3 Neuro-denies weakness no new focal deficits, no tremors GU-suprapubic catheter in situ    Data Reviewed: I have independently reviewed following labs and imaging studies   CBC Recent Labs  Lab 08/30/21 1338 08/30/21 2242 08/31/21 0551 09/01/21 0435 09/02/21 0304 09/04/21 0336 09/05/21 0303  WBC 7.1   < > 5.7 5.4 5.1 4.1 9.6  HGB 8.7*   < > 9.0* 8.6* 8.0* 8.0* 8.6*  HCT 25.4*   < > 26.1* 24.7* 22.8* 23.2* 25.2*  PLT 105*   < > 103* 102* 102* 123* 161  MCV 91.0   < > 90.9 91.8 91.2 90.3 92.3  MCH 31.2   < > 31.4 32.0 32.0 31.1 31.5  MCHC 34.3   < > 34.5 34.8 35.1 34.5 34.1  RDW 13.7   < >  13.9 14.3 14.3 13.9 14.1  LYMPHSABS 1.1  --   --   --   --   --  0.7  MONOABS 0.7  --   --   --   --   --  1.3*  EOSABS 0.0  --   --   --   --   --  0.0  BASOSABS 0.0  --   --   --   --   --  0.0   < > = values in this interval not displayed.    Recent Labs  Lab 08/30/21 1338 08/30/21 1517 08/30/21 2242 08/31/21 0551 09/01/21 0435 09/02/21 0304 09/03/21 0401 09/03/21 1635 09/04/21 0336 09/05/21 0303  NA 122*  --   --  125*   < > 128* 131* 130* 130* 129*  K 4.1  --   --  4.6   < > 3.6 3.3* 3.7 3.5 3.4*  CL 87*  --   --  95*   < > 98 99 97* 99 95*  CO2 23  --   --  22   < > '23 26 26 23 25  '$ GLUCOSE 141*  --   --  115*   < > 92 101* 112* 102* 112*  BUN 21  --   --  22   < > '17 12 10 9 '$ 7*  CREATININE 1.22  --   --  1.17   < > 1.17 1.03 1.06 1.09 1.00  CALCIUM 6.0*  --   --  5.6*   < > 5.4* 5.3* 5.8* 6.3* 6.3*  AST 26  --   --  21  --   --   --  27 22  --   ALT 32  --   --  29  --   --    --  31 30  --   ALKPHOS 44  --   --  39  --   --   --  48 43  --   BILITOT 0.7  --   --  1.2  --   --   --  1.2 1.2  --   ALBUMIN 2.8*  --   --  2.7*  --   --   --  2.5* 2.4*  --   MG  --   --   --   --   --   --   --   --  1.5* 1.4*  CRP 3.9*  --   --   --   --   --   --   --   --   --   PROCALCITON 0.17  --   --   --   --   --   --   --   --   --   LATICACIDVEN 3.3* 2.2* 2.7* 1.9  --   --   --   --   --   --   INR 1.9*  --   --   --   --   --   --   --   --   --   HGBA1C  --   --   --  6.1*  --   --   --   --   --   --    < > = values in this interval not displayed.    ------------------------------------------------------------------------------------------------------------------ No results for input(s): "CHOL", "HDL", "LDLCALC", "TRIG", "CHOLHDL", "LDLDIRECT" in the last 72 hours.  Lab Results  Component Value  Date   HGBA1C 6.1 (H) 08/31/2021   CBG: Recent Labs  Lab 09/04/21 1607 09/04/21 2104 09/05/21 0726 09/05/21 1110 09/05/21 1647  GLUCAP 120* 126* 104* 93 115*    Recent Results (from the past 240 hour(s))  Blood Culture (routine x 2)     Status: None   Collection Time: 08/30/21  1:38 PM   Specimen: BLOOD  Result Value Ref Range Status   Specimen Description BLOOD BLOOD LEFT HAND  Final   Special Requests   Final    BOTTLES DRAWN AEROBIC AND ANAEROBIC Blood Culture results may not be optimal due to an inadequate volume of blood received in culture bottles   Culture   Final    NO GROWTH 5 DAYS Performed at [facility], [address], [place], [place] [address]    Report Status 09/04/2021 FINAL  Final  Urine Culture     Status: Abnormal   Collection Time: 08/30/21  1:38 PM   Specimen: Urine, Suprapubic  Result Value Ref Range Status   Specimen Description   Final    URINE, SUPRAPUBIC Performed at [facility], [address] [place], [place] [address]    Special Requests   Final    NONE Performed at [facility], [address], [place], [place]  [address]    Culture (A)  Final    >=100,000 COLONIES/mL KLEBSIELLA OXYTOCA 80,000 COLONIES/mL PSEUDOMONAS AERUGINOSA    Report Status 09/02/2021 FINAL  Final   Organism ID, Bacteria KLEBSIELLA OXYTOCA (A)  Final   Organism ID, Bacteria PSEUDOMONAS AERUGINOSA (A)  Final      Susceptibility   Klebsiella oxytoca - MIC*    AMPICILLIN RESISTANT Resistant     CEFAZOLIN <=4 SENSITIVE Sensitive     CEFEPIME <=0.12 SENSITIVE Sensitive     CEFTRIAXONE <=0.25 SENSITIVE Sensitive     CIPROFLOXACIN <=0.25 SENSITIVE Sensitive     GENTAMICIN <=1 SENSITIVE Sensitive     IMIPENEM <=0.25 SENSITIVE Sensitive     NITROFURANTOIN 64 INTERMEDIATE Intermediate     TRIMETH/SULFA <=20 SENSITIVE Sensitive     AMPICILLIN/SULBACTAM 8 SENSITIVE Sensitive     PIP/TAZO 16 SENSITIVE Sensitive     * >=100,000 COLONIES/mL KLEBSIELLA OXYTOCA   Pseudomonas aeruginosa - MIC*    CEFTAZIDIME <=1 SENSITIVE Sensitive     CIPROFLOXACIN 0.5 SENSITIVE Sensitive     GENTAMICIN 4 SENSITIVE Sensitive     IMIPENEM 1 SENSITIVE Sensitive     PIP/TAZO <=4 SENSITIVE Sensitive     * 80,000 COLONIES/mL PSEUDOMONAS AERUGINOSA  Blood Culture (routine x 2)     Status: None   Collection Time: 08/30/21  2:18 PM   Specimen: BLOOD  Result Value Ref Range Status   Specimen Description BLOOD BLOOD RIGHT HAND  Final   Special Requests   Final    BOTTLES DRAWN AEROBIC AND ANAEROBIC Blood Culture results may not be optimal due to an excessive volume of blood received in culture bottles   Culture   Final    NO GROWTH 5 DAYS Performed at [facility], [address], [place], [place] [address]    Report Status 09/04/2021 FINAL  Final  SARS Coronavirus 2 by RT PCR (hospital order, performed in [facility] hospital lab) *cepheid single result test* Anterior Nasal Swab     Status: None   Collection Time: 08/30/21  4:29 PM   Specimen: Anterior Nasal Swab  Result Value Ref Range Status   SARS Coronavirus 2 by RT PCR NEGATIVE NEGATIVE Final     Comment: (  NOTE) SARS-CoV-2 target nucleic acids are NOT DETECTED.  The SARS-CoV-2 RNA is generally detectable in upper and lower respiratory specimens during the acute phase of infection. The lowest concentration of SARS-CoV-2 viral copies this assay can detect is 250 copies / mL. A negative result does not preclude SARS-CoV-2 infection and should not be used as the sole basis for treatment or other patient management decisions.  A negative result may occur with improper specimen collection / handling, submission of specimen other than nasopharyngeal swab, presence of viral mutation(s) within the areas targeted by this assay, and inadequate number of viral copies (<250 copies / mL). A negative result must be combined with clinical observations, patient history, and epidemiological information.  Fact Sheet for Patients:   https://www.patel.info/  Fact Sheet for Healthcare Providers: https://hall.com/  This test is not yet approved or  cleared by the [place] FDA and has been authorized for detection and/or diagnosis of SARS-CoV-2 by FDA under an Emergency Use Authorization (EUA).  This EUA will remain in effect (meaning this test can be used) for the duration of the COVID-19 declaration under Section 564(b)(1) of the Act, 21 U.S.C. section 360bbb-3(b)(1), unless the authorization is terminated or revoked sooner.  Performed at [facility], [address], [place], [place] [address]   MRSA Next Gen by PCR, Nasal     Status: None   Collection Time: 08/30/21  5:48 PM   Specimen: Urine, Suprapubic; Nasal Swab  Result Value Ref Range Status   MRSA by PCR Next Gen NOT DETECTED NOT DETECTED Final    Comment: (NOTE) The GeneXpert MRSA Assay (FDA approved for NASAL specimens only), is one component of a comprehensive MRSA colonization surveillance program. It is not intended to diagnose MRSA infection nor to guide or monitor treatment for  MRSA infections. Test performance is not FDA approved in patients less than 81 years old. Performed at [facility], [address] [place],  [address]      Roxan Hockey, MD  [facility]  Between 7 am - 7 pm I am available, please contact me via [place] (for emergencies) or Securechat (non urgent messages)  Between 7 pm - 7 am I am not available, please contact night coverage MD/APP via Amion

## 2021-09-05 NOTE — Progress Notes (Signed)
Tap water enema given. Large amount of brown liquid stool returned. Patient tolerated well.

## 2021-09-05 NOTE — Progress Notes (Signed)
Speech Language Pathology Treatment: Dysphagia  Patient Details Name: Paul Bradley MRN: [id number] DOB: [birth year] Today's Date: 09/05/2021 Time: 7106-2694 SLP Time Calculation (min) (ACUTE ONLY): 10 min  Assessment / Plan / Recommendation Clinical Impression  Ongoing diagnostic dysphagia treatment provided today. Pt consumed thin liquids via straw and cup. As previously noted by BSE, continue to hear inconsistent wet vocal quality and note some delayed coughing with thin liquids. Acknowledge GI request to not assess with solid textures and see that EGD/Colonoscopy will be completed tomorrow; ST will continue to follow after EGD/Colonoscopy and will complete full MBSS with all textures when medically appropriate if still indicated at that time. Continue to defer to GI for diet recommendation. Thank you,    HPI HPI: 76 year old male with history of recurrent, metastatic iodine refractory papillary thyroid carcinoma, no longer on treatment since May, suprapubic catheter, second toe osteomyelitis currently on linezolid twice daily, DM2, HTN, chronic A-fib on Eliquis comes into the hospital with weakness.  Since he has been on linezolid over the last month, he has been having poor appetite, diarrhea, and was dehydrated several times in the last month.  He has also been complaining of significant weakness.  Hospital course complicated by progressive anemia, blood in the stool, and cardiology was consulted. GI following and Pt on liquid diet. Chest x-ray 8/19-trace bilateral pleural effusions and no significant change with chest xray 8/20. BSE requested.      SLP Plan  Continue with current plan of care      Recommendations for follow up therapy are one component of a multi-disciplinary discharge planning process, led by the attending physician.  Recommendations may be updated based on patient status, additional functional criteria and insurance authorization.    Recommendations  Diet  recommendations: Thin liquid Liquids provided via: Cup;Straw Medication Administration: Whole meds with liquid Compensations: Small sips/bites                Oral Care Recommendations: Oral care BID Follow Up Recommendations: Follow physician's recommendations for discharge plan and follow up therapies Assistance recommended at discharge: PRN SLP Visit Diagnosis: Dysphagia, unspecified (R13.10) Plan: Continue with current plan of care         Bladyn Tipps H. Roddie Mc, CCC-SLP Speech Language Pathologist   Wende Bushy  09/05/2021, 5:39 PM

## 2021-09-05 NOTE — Care Management Important Message (Signed)
Important Message  Patient Details  Name: Paul Bradley MRN: [id number] Date of Birth: 04-17-1945   Medicare Important Message Given:  Yes     Tommy Medal 09/05/2021, 10:40 AM

## 2021-09-06 ENCOUNTER — Inpatient Hospital Stay (HOSPITAL_COMMUNITY): Payer: Medicare HMO | Admitting: Anesthesiology

## 2021-09-06 ENCOUNTER — Encounter (HOSPITAL_COMMUNITY)
Admission: EM | Disposition: A | Discharge status: Skilled Nursing Facility | Payer: Self-pay | Source: Home / Self Care | Attending: Family Medicine

## 2021-09-06 DIAGNOSIS — K222 Esophageal obstruction: Secondary | ICD-10-CM

## 2021-09-06 DIAGNOSIS — R579 Shock, unspecified: Secondary | ICD-10-CM | POA: Diagnosis not present

## 2021-09-06 DIAGNOSIS — R195 Other fecal abnormalities: Secondary | ICD-10-CM

## 2021-09-06 DIAGNOSIS — K5730 Diverticulosis of large intestine without perforation or abscess without bleeding: Secondary | ICD-10-CM

## 2021-09-06 DIAGNOSIS — R1310 Dysphagia, unspecified: Secondary | ICD-10-CM

## 2021-09-06 HISTORY — PX: ESOPHAGOGASTRODUODENOSCOPY (EGD) WITH PROPOFOL: SHX5813

## 2021-09-06 HISTORY — PX: BIOPSY: SHX5522

## 2021-09-06 HISTORY — PX: COLONOSCOPY WITH PROPOFOL: SHX5780

## 2021-09-06 LAB — RENAL FUNCTION PANEL
Albumin: 2.6 g/dL — ABNORMAL LOW (ref 3.5–5.0)
Anion gap: 9 (ref 5–15)
BUN: 7 mg/dL — ABNORMAL LOW (ref 8–23)
CO2: 25 mmol/L (ref 22–32)
Calcium: 6.1 mg/dL — CL (ref 8.9–10.3)
Chloride: 96 mmol/L — ABNORMAL LOW (ref 98–111)
Creatinine, Ser: 1.02 mg/dL (ref 0.61–1.24)
GFR, Estimated: >60 mL/min (ref >60)
Glucose, Bld: 100 mg/dL — ABNORMAL HIGH (ref 70–99)
Phosphorus: 5.3 mg/dL — ABNORMAL HIGH (ref 2.5–4.6)
Potassium: 3.6 mmol/L (ref 3.5–5.1)
Sodium: 130 mmol/L — ABNORMAL LOW (ref 135–145)

## 2021-09-06 LAB — GLUCOSE, CAPILLARY
Glucose-Capillary: 116 mg/dL — ABNORMAL HIGH (ref 70–99)
Glucose-Capillary: 94 mg/dL (ref 70–99)
Glucose-Capillary: 178 mg/dL — ABNORMAL HIGH (ref 70–99)
Glucose-Capillary: 107 mg/dL — ABNORMAL HIGH (ref 70–99)
Glucose-Capillary: 122 mg/dL — ABNORMAL HIGH (ref 70–99)
Glucose-Capillary: 137 mg/dL — ABNORMAL HIGH (ref 70–99)

## 2021-09-06 SURGERY — ESOPHAGOGASTRODUODENOSCOPY (EGD) WITH PROPOFOL
Anesthesia: General

## 2021-09-06 MED ORDER — PROPOFOL 10 MG/ML IV BOLUS
INTRAVENOUS | Status: DC | PRN
  Administered 2021-09-06: 120 mg via INTRAVENOUS
  Administered 2021-09-06: 100 mg via INTRAVENOUS

## 2021-09-06 MED ORDER — PANTOPRAZOLE SODIUM 40 MG PO TBEC
40.0000 mg | DELAYED_RELEASE_TABLET | Freq: Two times a day (BID) | ORAL | Status: DC
  Administered 2021-09-06 – 2021-09-09 (×6): 40 mg via ORAL
  Filled 2021-09-06 (×7): qty 1

## 2021-09-06 MED ORDER — LACTATED RINGERS IV SOLN: INTRAVENOUS | Status: DC | PRN

## 2021-09-06 MED ORDER — CALCIUM GLUCONATE-NACL 1-0.675 GM/50ML-% IV SOLN
1.0000 g | Freq: Once | INTRAVENOUS | Status: AC
  Administered 2021-09-06: 1000 mg via INTRAVENOUS
  Filled 2021-09-06: qty 50

## 2021-09-06 MED ORDER — POTASSIUM CHLORIDE 10 MEQ/100ML IV SOLN
10.0000 meq | INTRAVENOUS | Status: AC
  Administered 2021-09-06 (×4): 10 meq via INTRAVENOUS
  Filled 2021-09-06: qty 100

## 2021-09-06 NOTE — Anesthesia Postprocedure Evaluation (Signed)
Anesthesia Post Note  Patient: Paul Bradley  Procedure(s) Performed: ESOPHAGOGASTRODUODENOSCOPY (EGD) WITH PROPOFOL COLONOSCOPY WITH PROPOFOL BIOPSY  Patient location during evaluation: PACU Anesthesia Type: General Level of consciousness: awake and alert Pain management: pain level controlled Vital Signs Assessment: post-procedure vital signs reviewed and stable Respiratory status: spontaneous breathing, nonlabored ventilation, respiratory function stable and patient connected to nasal cannula oxygen Cardiovascular status: blood pressure returned to baseline and stable Postop Assessment: no apparent nausea or vomiting Anesthetic complications: no   No notable events documented.   Last Vitals:  Vitals:   09/05/21 1607 09/05/21 2053  BP: 115/67 109/77  Pulse: 100 70  Resp: 20 20  Temp:  36.8 C  SpO2: 100% 100%    Last Pain:  Vitals:   09/05/21 2053  TempSrc: Oral  PainSc: 0-No pain                 Louann Sjogren

## 2021-09-06 NOTE — Transfer of Care (Signed)
Immediate Anesthesia Transfer of Care Note  Patient: Paul Bradley  Procedure(s) Performed: ESOPHAGOGASTRODUODENOSCOPY (EGD) WITH PROPOFOL COLONOSCOPY WITH PROPOFOL BIOPSY  Patient Location: PACU  Anesthesia Type:General  Level of Consciousness: awake and alert   Airway & Oxygen Therapy: Patient Spontanous Breathing and Patient connected to nasal cannula oxygen  Post-op Assessment: Report given to RN and Post -op Vital signs reviewed and stable  Post vital signs: Reviewed and stable  Last Vitals:  Vitals Value Taken Time  BP 107/69   Temp 98.2   Pulse 92   Resp 20   SpO2 93     Last Pain:  Vitals:   09/05/21 2053  TempSrc: Oral  PainSc: 0-No pain      Patients Stated Pain Goal: 0 (47/58/30 7460)  Complications: No notable events documented.

## 2021-09-06 NOTE — H&P (Signed)
  Patient stable overnight.  Hemoglobin 8.7 from yesterday morning.  No CBC for today.  Patient endorses esophageal dysphagia.  As planned, will proceed with EGD with possible esophageal dilation as feasible/appropriate along with a diagnostic colonoscopy today.  Risk, benefits, limitations have been reviewed.  Patient agreeable.  Discussed with anesthesia.  Further recommendations to follow.

## 2021-09-06 NOTE — Anesthesia Preprocedure Evaluation (Signed)
Anesthesia Evaluation  Patient identified by MRN, date of birth, ID band Patient awake    Reviewed: Allergy & Precautions, H&P , NPO status , Patient's Chart, lab work & pertinent test results, reviewed documented beta blocker date and time   Airway Mallampati: II  TM Distance: >3 FB Neck ROM: full    Dental no notable dental hx.    Pulmonary neg pulmonary ROS,    Pulmonary exam normal breath sounds clear to auscultation       Cardiovascular Exercise Tolerance: Good hypertension, + CAD   Rhythm:irregular Rate:Normal     Neuro/Psych negative neurological ROS  negative psych ROS   GI/Hepatic negative GI ROS, Neg liver ROS,   Endo/Other  negative endocrine ROSdiabetes, Type 2  Renal/GU negative Renal ROS  negative genitourinary   Musculoskeletal   Abdominal   Peds  Hematology  (+) Blood dyscrasia, anemia ,   Anesthesia Other Findings   Reproductive/Obstetrics negative OB ROS                             Anesthesia Physical Anesthesia Plan  ASA: 3  Anesthesia Plan: General   Post-op Pain Management:    Induction:   PONV Risk Score and Plan: Propofol infusion  Airway Management Planned:   Additional Equipment:   Intra-op Plan:   Post-operative Plan:   Informed Consent: I have reviewed the patients History and Physical, chart, labs and discussed the procedure including the risks, benefits and alternatives for the proposed anesthesia with the patient or authorized representative who has indicated his/her understanding and acceptance.     Dental Advisory Given  Plan Discussed with: CRNA  Anesthesia Plan Comments:         Anesthesia Quick Evaluation

## 2021-09-06 NOTE — Progress Notes (Signed)
PROGRESS NOTE  SEARCY MIYOSHI FXT:[id number] DOB: 1945/06/25 DOA: 08/30/2021 PCP: [name], MD   LOS: 7 days   Brief Narrative / Interim history: 76 year old male with history of recurrent, metastatic iodine refractory papillary thyroid carcinoma, no longer on treatment since May, suprapubic catheter, second toe osteomyelitis currently on linezolid twice daily, DM2, HTN, chronic A-fib on Eliquis comes into the hospital with weakness.  Since he has been on linezolid over the last month, he has been having poor appetite, diarrhea, and was dehydrated several times in the last month.  He has also been complaining of significant weakness.  Hospital course complicated by progressive anemia, blood in the stool, and cardiology was consulted.  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  No clear-cut overt GI bleeding and his hemoglobin drop has been over the past 6 months.  Gastroenterology consulted and following, they are planning to do endoscopic evaluation however they wish for his electrolytes to be corrected prior to anesthesia --s/p EGD and colonoscopy on 09/06/2021 --colonoscopy was unremarkable, EGD showed gastric  erosions. Marked edema and erosions of the proximal duodenum - suspect site of bleeding in the setting of anticoagulation -GI consulted and recommends Protonix twice daily for 1 month and then daily after that -Esophageal evaluation revealed no significant findings esophagus was dilated during procedure -Monitor H&H and transfuse as clinically indicated   Active problems -Dysphagia and concerns about possible aspiration--- speech pathology eval appreciated -had EGD on 09/03/2021--Esophageal evaluation revealed no significant findings esophagus was dilated during procedure -We will most likely need full MBBS  Generalized weakness-in the setting of hypovolemia and dehydration, also concern for a component of blood loss.  May be related to anemia continue supportive care  Anemia of chronic disease now with superimposed acute blood loss anemia- -GI service would like to correct electrolyte abnormalities prior to having him undergo anesthesia for endoluminal evaluation  Thrombocytopenia -new, platelets have been normal up until this hospitalization, I wonder whether it is a consumptive process.  Stable over the last 5 days, continue to monitor  Osteomyelitis of second toe left foot-status post amputation last month, has been on suppressive antibiotics with plans to stop on 8/29.  He has healed adequately and there is no sign of infection, previous hospitalist decided to hold any further treatment with Zyvox.  He remains afebrile and WBC is normal  Thyroid cancer-outpatient follow-up  Hypocalcemia-chronic, resume home calcitriol as well as vitamin D today, continue calcium repletion oral and IV,   Chronic A-fib-restarted metoprolol for rate control, Eliquis is on hold due to GI bleed  Essential hypertension-hypotension has resolved, midodrine discontinued -Continue metoprolol for rate control if BP tolerates,   -Pressure injury of skin, POA-stage I left buttocks and stage II medial aspect  coccyx area  Suprapubic catheter-chronic, due to be exchanged on 8/22, Dr. Dyann Kief reached out to urology based on his prior notes -Hopefully catheter can be changed prior to discharge  Hyponatremia/hypokalemia/hypomagnesemia-hydrate and replace electrolytes  Scheduled Meds:  calcitRIOL  0.5 mcg Oral Daily   calcium carbonate  1 tablet Oral BID WC   Chlorhexidine Gluconate Cloth  6 each Topical Daily   cholecalciferol  1,000 Units Oral Daily   insulin aspart  0-15 Units Subcutaneous TID WC   metoprolol succinate  25 mg Oral Daily   midodrine  10 mg Oral TID WC   ondansetron (ZOFRAN) IV  4 mg Intravenous Q12H   pantoprazole  40 mg Oral BID AC   sodium chloride flush  3 mL Intravenous Q12H   sodium chloride  1 g Oral Daily   Continuous Infusions:  ondansetron (ZOFRAN) IV Stopped (08/31/21 0937)   PRN Meds:.acetaminophen, bisacodyl, guaiFENesin-dextromethorphan, ipratropium-albuterol, labetalol, metoprolol tartrate, morphine injection, ondansetron **OR** ondansetron (ZOFRAN) IV, mouth rinse  Diet Orders (From admission, onward)     Start     Ordered   09/06/21 0936  Diet Carb Modified Fluid consistency: Thin; Room service appropriate? Yes  Diet effective now       Question Answer Comment  Diet-HS Snack? Nothing   Calorie Level Medium 1600-2000   Fluid consistency: Thin   Room service appropriate? Yes      09/06/21 0936           DVT prophylaxis: Place and maintain sequential compression device Start: 08/30/21 2111   Lab Results  Component Value Date   PLT 161 09/05/2021     Code Status: DNR  Family Communication: Patient's sister  Status is: Inpatient Remains inpatient appropriate because: Anemia, hyocalcemia, hyponatremia  Level of care: Telemetry  Consultants:  Gastroenterology  Objective: Vitals:   09/05/21 2053 09/06/21 0935 09/06/21 0945 09/06/21 1032  BP: 109/77 107/69 110/85 139/81  Pulse: 70 90 88 99  Resp: '20 18 20 20  '$ Temp: 98.3 F (36.8 C)  98.2 F (36.8 C)  97.7 F (36.5 C)  TempSrc: Oral   Oral  SpO2: 100% 96% 98% 100%  Weight:      Height:        Intake/Output Summary (Last 24 hours) at 09/06/2021 1905 Last data filed at 09/06/2021 0935 Gross per 24 hour  Intake --  Output 400 ml  Net -400 ml   Wt Readings from Last 3 Encounters:  09/01/21 96.4 kg  07/21/21 90.3 kg  02/11/21 89.8 kg   Physical Exam  Gen:- Awake Alert, in no acute distress  HEENT:- [place].AT, No sclera icterus Nose- [place] 2L/min Neck-Supple Neck,No JVD,.  Lungs-improving air movement, no wheezing CV- S1, S2 normal, RRR Abd-  +ve B.Sounds, Abd Soft, No tenderness,    Extremity/Skin:- trace  edema,   good pedal pulses  Psych-affect is appropriate, oriented x3 Neuro-denies weakness no new focal deficits, no tremors GU-suprapubic catheter in situ   Data Reviewed: I have independently reviewed following labs and imaging studies   CBC Recent Labs  Lab 08/31/21 0551 09/01/21 0435 09/02/21 0304 09/04/21 0336 09/05/21 0303  WBC 5.7 5.4 5.1 4.1 9.6  HGB 9.0* 8.6* 8.0* 8.0* 8.6*  HCT 26.1* 24.7* 22.8* 23.2* 25.2*  PLT 103* 102* 102* 123* 161  MCV 90.9 91.8 91.2 90.3 92.3  MCH 31.4 32.0 32.0 31.1 31.5  MCHC 34.5 34.8 35.1 34.5 34.1  RDW 13.9 14.3 14.3 13.9 14.1  LYMPHSABS  --   --   --   --  0.7  MONOABS  --   --   --   --  1.3*  EOSABS  --   --   --   --  0.0  BASOSABS  --   --   --   --  0.0    Recent Labs  Lab 08/30/21 2242 08/31/21 0551 09/01/21 0435 09/03/21 0401 09/03/21 1635 09/04/21 0336 09/05/21 0303 09/06/21 0539  NA  --  125*   < > 131* 130* 130* 129* 130*  K  --  4.6   < > 3.3* 3.7 3.5 3.4* 3.6  CL  --  95*   < > 99 97* 99 95* 96*  CO2  --  22   < > '26 26 23 25 25  '$ GLUCOSE  --  115*   < > 101* 112* 102* 112* 100*  BUN  --  22   < > '12 10 9 '$ 7* 7*  CREATININE  --  1.17   < > 1.03 1.06 1.09 1.00 1.02  CALCIUM  --  5.6*   < > 5.3* 5.8* 6.3* 6.3* 6.1*  AST  --  21  --   --  27 22  --   --   ALT  --  29  --   --  31 30   --   --   ALKPHOS  --  39  --   --  48 43  --   --   BILITOT  --  1.2  --   --  1.2 1.2  --   --   ALBUMIN  --  2.7*  --   --  2.5* 2.4*  --  2.6*  MG  --   --   --   --   --  1.5* 1.4*  --   LATICACIDVEN 2.7* 1.9  --   --   --   --   --   --   HGBA1C  --  6.1*  --   --   --   --   --   --    < > = values in this interval not displayed.    ------------------------------------------------------------------------------------------------------------------ No results for input(s): "CHOL", "HDL", "LDLCALC", "TRIG", "CHOLHDL", "LDLDIRECT" in the last 72 hours.  Lab Results  Component Value Date   HGBA1C 6.1 (H) 08/31/2021   CBG: Recent Labs  Lab 09/05/21 2050 09/06/21 0717 09/06/21 0905 09/06/21 1120 09/06/21 1636  GLUCAP 116* 94 178* 107* 122*    Recent Results (from the past 240 hour(s))  Blood Culture (routine x 2)     Status: None   Collection Time: 08/30/21  1:38 PM   Specimen: BLOOD  Result Value Ref Range Status   Specimen Description BLOOD BLOOD LEFT HAND  Final   Special Requests   Final    BOTTLES DRAWN AEROBIC AND ANAEROBIC Blood Culture results may not be optimal due to an inadequate volume of blood received in culture bottles   Culture   Final    NO GROWTH 5 DAYS Performed at [facility], [address], [place], [place] [address]    Report Status 09/04/2021 FINAL  Final  Urine Culture     Status: Abnormal   Collection Time: 08/30/21  1:38 PM   Specimen: Urine, Suprapubic  Result Value Ref Range Status   Specimen Description   Final    URINE, SUPRAPUBIC Performed at [facility], [address], [place], [place] [address]    Special Requests   Final  NONE Performed at [facility], [address] [place], [place] [address]    Culture (A)  Final    >=100,000 COLONIES/mL KLEBSIELLA OXYTOCA 80,000 COLONIES/mL PSEUDOMONAS AERUGINOSA    Report Status 09/02/2021 FINAL  Final   Organism ID, Bacteria KLEBSIELLA OXYTOCA (A)  Final   Organism ID, Bacteria  PSEUDOMONAS AERUGINOSA (A)  Final      Susceptibility   Klebsiella oxytoca - MIC*    AMPICILLIN RESISTANT Resistant     CEFAZOLIN <=4 SENSITIVE Sensitive     CEFEPIME <=0.12 SENSITIVE Sensitive     CEFTRIAXONE <=0.25 SENSITIVE Sensitive     CIPROFLOXACIN <=0.25 SENSITIVE Sensitive     GENTAMICIN <=1 SENSITIVE Sensitive     IMIPENEM <=0.25 SENSITIVE Sensitive     NITROFURANTOIN 64 INTERMEDIATE Intermediate     TRIMETH/SULFA <=20 SENSITIVE Sensitive     AMPICILLIN/SULBACTAM 8 SENSITIVE Sensitive     PIP/TAZO 16 SENSITIVE Sensitive     * >=100,000 COLONIES/mL KLEBSIELLA OXYTOCA   Pseudomonas aeruginosa - MIC*    CEFTAZIDIME <=1 SENSITIVE Sensitive     CIPROFLOXACIN 0.5 SENSITIVE Sensitive     GENTAMICIN 4 SENSITIVE Sensitive     IMIPENEM 1 SENSITIVE Sensitive     PIP/TAZO <=4 SENSITIVE Sensitive     * 80,000 COLONIES/mL PSEUDOMONAS AERUGINOSA  Blood Culture (routine x 2)     Status: None   Collection Time: 08/30/21  2:18 PM   Specimen: BLOOD  Result Value Ref Range Status   Specimen Description BLOOD BLOOD RIGHT HAND  Final   Special Requests   Final    BOTTLES DRAWN AEROBIC AND ANAEROBIC Blood Culture results may not be optimal due to an excessive volume of blood received in culture bottles   Culture   Final    NO GROWTH 5 DAYS Performed at [facility], [address] [place], [place] [address]    Report Status 09/04/2021 FINAL  Final  SARS Coronavirus 2 by RT PCR (hospital order, performed in [facility] hospital lab) *cepheid single result test* Anterior Nasal Swab     Status: None   Collection Time: 08/30/21  4:29 PM   Specimen: Anterior Nasal Swab  Result Value Ref Range Status   SARS Coronavirus 2 by RT PCR NEGATIVE NEGATIVE Final    Comment: (NOTE) SARS-CoV-2 target nucleic acids are NOT DETECTED.  The SARS-CoV-2 RNA is generally detectable in upper and lower respiratory specimens during the acute phase of infection. The lowest concentration of SARS-CoV-2 viral copies  this assay can detect is 250 copies / mL. A negative result does not preclude SARS-CoV-2 infection and should not be used as the sole basis for treatment or other patient management decisions.  A negative result may occur with improper specimen collection / handling, submission of specimen other than nasopharyngeal swab, presence of viral mutation(s) within the areas targeted by this assay, and inadequate number of viral copies (<250 copies / mL). A negative result must be combined with clinical observations, patient history, and epidemiological information.  Fact Sheet for Patients:   https://www.patel.info/  Fact Sheet for Healthcare Providers: https://hall.com/  This test is not yet approved or  cleared by the [place] FDA and has been authorized for detection and/or diagnosis of SARS-CoV-2 by FDA under an Emergency Use Authorization (EUA).  This EUA will remain in effect (meaning this test can be used) for the duration of the COVID-19 declaration under Section 564(b)(1) of the Act, 21 U.S.C. section 360bbb-3(b)(1), unless the authorization is terminated or revoked sooner.  Performed  at [facility], [address], [place], [place] [address]   MRSA Next Gen by PCR, Nasal     Status: None   Collection Time: 08/30/21  5:48 PM   Specimen: Urine, Suprapubic; Nasal Swab  Result Value Ref Range Status   MRSA by PCR Next Gen NOT DETECTED NOT DETECTED Final    Comment: (NOTE) The GeneXpert MRSA Assay (FDA approved for NASAL specimens only), is one component of a comprehensive MRSA colonization surveillance program. It is not intended to diagnose MRSA infection nor to guide or monitor treatment for MRSA infections. Test performance is not FDA approved in patients less than 42 years old. Performed at [facility], [address], [place], [place] [address]      Roxan Hockey, MD  [facility]  Between 7 am - 7 pm I am  available, please contact me via [place] (for emergencies) or Securechat (non urgent messages)  Between 7 pm - 7 am I am not available, please contact night coverage MD/APP via Amion

## 2021-09-06 NOTE — Op Note (Addendum)
[facility] Patient Name: Paul Bradley Procedure Date: 09/06/2021 8:27 AM MRN: [id number] Date of Birth: 04-24-1945 Attending MD: Norvel Richards , MD CSN: [id number] Age: 76 Admit Type: Outpatient Procedure:                Upper GI endoscopy Indications:              Dysphagia, Heme positive stool Providers:                Norvel Richards, MD, Lurline Del, RN, Casimer Bilis, Technician Referring MD:              Medicines:                Propofol per Anesthesia Complications:            No immediate complications. Estimated Blood Loss:     Estimated blood loss was minimal. Procedure:                Pre-Anesthesia Assessment:                           - Prior to the procedure, a History and Physical                            was performed, and patient medications and                            allergies were reviewed. The patient's tolerance of                            previous anesthesia was also reviewed. The risks                            and benefits of the procedure and the sedation                            options and risks were discussed with the patient.                            All questions were answered, and informed consent                            was obtained. Prior Anticoagulants: The patient has                            taken no previous anticoagulant or antiplatelet                            agents and last took Eliquis (apixaban) 3 days                            prior to the procedure. ASA Grade Assessment: III -  A patient with severe systemic disease. After                            reviewing the risks and benefits, the patient was                            deemed in satisfactory condition to undergo the                            procedure.                           After obtaining informed consent, the endoscope was                            passed under direct vision.  Throughout the                            procedure, the patient's blood pressure, pulse, and                            oxygen saturations were monitored continuously. The                            GIF-H190 ([id number]) scope was introduced through the                            mouth, and advanced to the second part of duodenum.                            The upper GI endoscopy was accomplished without                            difficulty. The patient tolerated the procedure                            well. Scope In: 9:06:12 AM Scope Out: 9:14:22 AM Total Procedure Duration: 0 hours 8 minutes 10 seconds  Findings:      A mild Schatzki ring was found at the gastroesophageal junction. No       mass. No esophagitis. No Barrett's epithelium.      Stomach empty. Scattered gastric erosions in the antrum and body. No       ulcer or infiltrating process. Pylorus patent easily traversed.       Examination the bulb and proximal second portion revealed markedly       eroded mucosa with edema. No ulcer or infiltrating process observed.       Scope was withdrawn, a 35 [place] Maloney dilator passed full insertion       with mild resistance. A look back revealed the ring of been nicely       ruptured without apparent complication. Finally. Antrum and body of the       the stomach biopsied for histologic study. Impression:               - Mild Schatzki ring. Dilated                           -  Gastric erosions. Marked edema and erosions of the                            proximal duodenum - suspect site of bleeding in the                            setting of anticoagulation. Status post gastric                            biopsy for H. pylori testing. Moderate Sedation:      Moderate (conscious) sedation was personally administered by an       anesthesia professional. The following parameters were monitored: oxygen       saturation, heart rate, blood pressure, respiratory rate, EKG, adequacy       of  pulmonary ventilation, and response to care. Recommendation:           - Patient has a contact number available for                            emergencies. The signs and symptoms of potential                            delayed complications were discussed with the                            patient. Return to normal activities tomorrow.                            Written discharge instructions were provided to the                            patient.                           - Diabetic (ADA) diet. Transition PPI to twice                            daily orally x1 month and drop back to once daily                            thereafter. Follow-up on pathology. Avoid NSAIDs                            moving forward. May resume anticoagulation 8/28.                           -Discussed with sister, Paul Bradley, at [facility]                           - Return patient to hospital ward for ongoing care. Procedure Code(s):        --- Professional ---                           [phone], Esophagogastroduodenoscopy, flexible,  transoral; diagnostic, including collection of                            specimen(s) by brushing or washing, when performed                            (separate procedure) Diagnosis Code(s):        --- Professional ---                           K22.2, Esophageal obstruction                           R13.10, Dysphagia, unspecified                           R19.5, Other fecal abnormalities CPT copyright 2019 American Medical Association. All rights reserved. The codes documented in this report are preliminary and upon coder review may  be revised to meet current compliance requirements. [name]. [name], MD Norvel Richards, MD 09/06/2021 9:48:29 AM This report has been signed electronically. Number of Addenda: 0

## 2021-09-06 NOTE — Progress Notes (Signed)
Attempted first tap water enema. Patient was not able to hold water long enough and was pushing against the tubing. His stool is watery and yellow at this time.

## 2021-09-06 NOTE — Op Note (Addendum)
[facility] Patient Name: Paul Bradley Procedure Date: 09/06/2021 8:26 AM MRN: [id number] Date of Birth: 10-27-1945 Attending MD: Norvel Richards , MD CSN: [id number] Age: 76 Admit Type: Inpatient Procedure:                Colonoscopy Indications:              Heme positive stool Providers:                Norvel Richards, MD, Lurline Del, RN, Casimer Bilis, Technician Referring MD:              Medicines:                Propofol per Anesthesia Complications:            No immediate complications. Estimated Blood Loss:     Estimated blood loss: none. Procedure:                Pre-Anesthesia Assessment:                           - Prior to the procedure, a History and Physical                            was performed, and patient medications and                            allergies were reviewed. The patient's tolerance of                            previous anesthesia was also reviewed. The risks                            and benefits of the procedure and the sedation                            options and risks were discussed with the patient.                            All questions were answered, and informed consent                            was obtained. Prior Anticoagulants: The patient                            last took Eliquis (apixaban) 3 days prior to the                            procedure. ASA Grade Assessment: III - A patient                            with severe systemic disease. After reviewing the  risks and benefits, the patient was deemed in                            satisfactory condition to undergo the procedure.                           After obtaining informed consent, the colonoscope                            was passed under direct vision. Throughout the                            procedure, the patient's blood pressure, pulse, and                            oxygen saturations  were monitored continuously. The                            [id number] scope was introduced through the                            anus and advanced to the 10 cm into the ileum. The                            colonoscopy was performed without difficulty. The                            patient tolerated the procedure well. The quality                            of the bowel preparation was adequate. Scope In: 9:18:51 AM Scope Out: 9:30:44 AM Scope Withdrawal Time: 0 hours 7 minutes 59 seconds  Total Procedure Duration: 0 hours 11 minutes 53 seconds  Findings:      The perianal and digital rectal examinations were normal.      Scattered medium-mouthed diverticula were found in the sigmoid colon.      Non-bleeding internal hemorrhoids were found during retroflexion. The       hemorrhoids were mild, small and Grade I (internal hemorrhoids that do       not prolapse).      The exam was otherwise without abnormality on direct and retroflexion       views. Distal 10 cm of TI appeared normal. Impression:               - Diverticulosis in the sigmoid colon.                           - Non-bleeding internal hemorrhoids.                           - The examination was otherwise normal on direct                            and retroflexion views.                           -  Normal-appearing terminal ileum. No specimen                            collected. Moderate Sedation:      Moderate (conscious) sedation was personally administered by an       anesthesia professional. The following parameters were monitored: oxygen       saturation, heart rate, blood pressure, respiratory rate, EKG, adequacy       of pulmonary ventilation, and response to care. Recommendation:           - Patient has a contact number available for                            emergencies. The signs and symptoms of potential                            delayed complications were discussed with the                             patient. Return to normal activities tomorrow.                            Written discharge instructions were provided to the                            patient.                           - Return patient to hospital ward for ongoing care.                           - Advance diet as tolerated.                           - No repeat colonoscopy due to age. See EGD report.                            Discussed findings and recommendations with                            patient's sister, [name].                           - Return patient to hospital ward for ongoing care. Procedure Code(s):        --- Professional ---                           [phone], Colonoscopy, flexible; diagnostic, including                            collection of specimen(s) by brushing or washing,                            when performed (separate procedure) Diagnosis Code(s):        --- Professional ---  K64.0, First degree hemorrhoids                           R19.5, Other fecal abnormalities                           K57.30, Diverticulosis of large intestine without                            perforation or abscess without bleeding CPT copyright 2019 American Medical Association. All rights reserved. The codes documented in this report are preliminary and upon coder review may  be revised to meet current compliance requirements. [name]. Lalania Haseman, MD Norvel Richards, MD 09/06/2021 9:52:48 AM This report has been signed electronically. Number of Addenda: 0

## 2021-09-06 NOTE — Progress Notes (Signed)
Second tap water enema given with yellow watery results. Patient tolerated well.

## 2021-09-07 DIAGNOSIS — R579 Shock, unspecified: Secondary | ICD-10-CM | POA: Diagnosis not present

## 2021-09-07 LAB — RENAL FUNCTION PANEL
Albumin: 2.6 g/dL — ABNORMAL LOW (ref 3.5–5.0)
Anion gap: 11 (ref 5–15)
BUN: 7 mg/dL — ABNORMAL LOW (ref 8–23)
CO2: 24 mmol/L (ref 22–32)
Calcium: 6.3 mg/dL — CL (ref 8.9–10.3)
Chloride: 99 mmol/L (ref 98–111)
Creatinine, Ser: 1.04 mg/dL (ref 0.61–1.24)
GFR, Estimated: >60 mL/min (ref >60)
Glucose, Bld: 91 mg/dL (ref 70–99)
Phosphorus: 4.5 mg/dL (ref 2.5–4.6)
Potassium: 4 mmol/L (ref 3.5–5.1)
Sodium: 134 mmol/L — ABNORMAL LOW (ref 135–145)

## 2021-09-07 LAB — GLUCOSE, CAPILLARY
Glucose-Capillary: 105 mg/dL — ABNORMAL HIGH (ref 70–99)
Glucose-Capillary: 106 mg/dL — ABNORMAL HIGH (ref 70–99)
Glucose-Capillary: 111 mg/dL — ABNORMAL HIGH (ref 70–99)
Glucose-Capillary: 175 mg/dL — ABNORMAL HIGH (ref 70–99)

## 2021-09-07 LAB — CBC
HCT: 25.5 % — ABNORMAL LOW (ref 39.0–52.0)
Hemoglobin: 8.5 g/dL — ABNORMAL LOW (ref 13.0–17.0)
MCH: 31.7 pg (ref 26.0–34.0)
MCHC: 33.3 g/dL (ref 30.0–36.0)
MCV: 95.1 fL (ref 80.0–100.0)
Platelets: 215 10*3/uL (ref 150–400)
RBC: 2.68 MIL/uL — ABNORMAL LOW (ref 4.22–5.81)
RDW: 14.9 % (ref 11.5–15.5)
WBC: 5.5 10*3/uL (ref 4.0–10.5)
nRBC: 0.4 % — ABNORMAL HIGH (ref 0.0–0.2)

## 2021-09-07 LAB — MAGNESIUM: Magnesium: 1.7 mg/dL (ref 1.7–2.4)

## 2021-09-07 MED ORDER — MAGNESIUM SULFATE 2 GM/50ML IV SOLN
2.0000 g | Freq: Once | INTRAVENOUS | Status: AC
  Administered 2021-09-07: 2 g via INTRAVENOUS
  Filled 2021-09-07: qty 50

## 2021-09-07 MED ORDER — CALCIUM GLUCONATE-NACL 1-0.675 GM/50ML-% IV SOLN
1.0000 g | Freq: Once | INTRAVENOUS | Status: AC
  Administered 2021-09-07: 1000 mg via INTRAVENOUS

## 2021-09-07 NOTE — Progress Notes (Signed)
PROGRESS NOTE  Paul Bradley WIO:[id number] DOB: 10/06/1945 DOA: 08/30/2021 PCP: [name], MD   LOS: 8 days   Brief Narrative / Interim history: 76 year old male with history of recurrent, metastatic iodine refractory papillary thyroid carcinoma, no longer on treatment since May, suprapubic catheter, second toe osteomyelitis currently on linezolid twice daily, DM2, HTN, chronic A-fib on Eliquis comes into the hospital with weakness.  Since he has been on linezolid over the last month, he has been having poor appetite, diarrhea, and was dehydrated several times in the last month.  He has also been complaining of significant weakness.  Hospital course complicated by progressive anemia, blood in the stool, and cardiology was consulted.  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  No clear-cut overt GI bleeding and his hemoglobin drop has been over the past 6 months.  Gastroenterology consulted and following, they are planning to do endoscopic evaluation however they wish for his electrolytes to be corrected prior to anesthesia --s/p EGD and colonoscopy on 09/06/2021 --colonoscopy was unremarkable, EGD showed gastric  erosions. Marked edema and erosions of the proximal duodenum - suspect site of bleeding in the setting of anticoagulation -GI consulted and recommends Protonix twice daily for 1 month and then daily after that -Esophageal evaluation revealed no significant findings esophagus was dilated during procedure -HGB currently above 8   Active problems -Dysphagia and concerns about possible aspiration--- speech pathology eval appreciated -had EGD on 09/03/2021--Esophageal evaluation revealed no significant findings esophagus was dilated during procedure Defer to speech pathologist if full MBBS needed on 09/08/2021   Generalized weakness-in the setting of hypovolemia and dehydration, also concern for a component of blood loss.  May be related to anemia continue supportive care  Anemia of chronic disease now with superimposed acute blood loss anemia- -GI service would like to correct electrolyte abnormalities prior to having him undergo anesthesia for endoluminal evaluation  Thrombocytopenia -new, platelets have been normal up until this hospitalization, I wonder whether it is a consumptive process.  Stable over the last 5 days, continue to monitor  Osteomyelitis of second toe left foot-status post amputation last month, has been on suppressive antibiotics with plans to stop on 8/29.  He has healed adequately and there is no sign of infection, previous hospitalist decided to hold any further treatment with Zyvox.  He remains afebrile and WBC is normal  Thyroid cancer-outpatient follow-up  Hypocalcemia-chronic, c/n home calcitriol as well as vitamin D , --continue calcium repletion oral and IV,   Chronic A-fib-continue metoprolol for rate control,  -Eliquis is on hold due to GI bleed -Per GI service may resume Eliquis on or after 09/08/2021  Essential hypertension-hypotension has resolved, midodrine discontinued -Continue metoprolol for rate control if BP tolerates,   -Pressure  injury of skin,  POA-stage I left buttocks and stage II medial aspect coccyx area  Suprapubic catheter-chronic, due to be exchanged on 8/22, Dr. Dyann Kief reached out to urology based on his prior notes -Hopefully catheter can be changed prior to discharge  Hyponatremia/hypokalemia/hypomagnesemia-hydrate and replace electrolytes  Disposition--awaiting urologist to help with suprapubic Foley catheter change on 09/08/2021, awaiting PT eval on 09/08/2021 -Anticipate possible discharge home with home health on 09/08/2021 if H&H is stable and cleared by speech pathology/swallowing okay  Scheduled Meds:  calcitRIOL  0.5 mcg Oral Daily   calcium carbonate  1 tablet Oral BID WC   Chlorhexidine Gluconate Cloth  6 each Topical Daily   cholecalciferol  1,000 Units Oral Daily   insulin aspart  0-15 Units Subcutaneous TID WC   metoprolol succinate  25 mg Oral Daily   midodrine  10 mg Oral TID WC   ondansetron (ZOFRAN) IV  4 mg Intravenous Q12H   pantoprazole  40 mg Oral BID AC   sodium chloride flush  3 mL Intravenous Q12H   sodium chloride  1 g Oral Daily   Continuous Infusions:  ondansetron (ZOFRAN) IV Stopped (08/31/21 0937)   PRN Meds:.acetaminophen, bisacodyl, guaiFENesin-dextromethorphan, ipratropium-albuterol, labetalol, metoprolol tartrate, morphine injection, ondansetron **OR** ondansetron (ZOFRAN) IV, mouth rinse  Diet Orders (From admission, onward)     Start     Ordered   09/06/21 1909  DIET SOFT Room service appropriate? Yes; Fluid consistency: Thin  Diet effective now       Question Answer Comment  Room service appropriate? Yes   Fluid consistency: Thin      09/06/21 1908           DVT prophylaxis: Place and maintain sequential compression device Start: 08/30/21 2111   Lab Results  Component Value Date   PLT 215 09/07/2021     Code Status: DNR  Family Communication: Patient's sister  Status is: Inpatient Remains inpatient appropriate because: Anemia, hyocalcemia,  hyponatremia  Level of care: Telemetry  Consultants:  Gastroenterology  Objective: Vitals:   09/06/21 0945 09/06/21 1032 09/06/21 2105 09/07/21 1515  BP: 110/85 139/81 112/74 135/75  Pulse: 88 99 83 76  Resp: '20 20 16 20  '$ Temp:  97.7 F (36.5 C) (!) 97.3 F (36.3 C) 98.3 F (36.8 C)  TempSrc:  Oral  Oral  SpO2: 98% 100% 95% 100%  Weight:      Height:        Intake/Output Summary (Last 24 hours) at 09/07/2021 1850 Last data filed at 09/06/2021 2207 Gross per 24 hour  Intake --  Output 800 ml  Net -800 ml   Wt Readings from Last 3 Encounters:  09/01/21 96.4 kg  07/21/21 90.3 kg  02/11/21 89.8 kg   Physical Exam  Gen:- Awake Alert, in no acute distress  HEENT:- [place].AT, No sclera icterus Nose- [place] 2L/min Neck-Supple Neck,No JVD,.  Lungs-improving air movement, no wheezing CV- S1, S2 normal, RRR Abd-  +ve B.Sounds, Abd Soft, No tenderness,    Extremity/Skin:- trace  edema,   good pedal pulses  Psych-affect is appropriate, oriented x3 Neuro-denies weakness no new focal deficits, no tremors GU-suprapubic catheter in situ   Data Reviewed: I have independently reviewed following labs and imaging studies   CBC Recent Labs  Lab 09/01/21 0435 09/02/21 0304 09/04/21 0336 09/05/21 0303 09/07/21 0548  WBC 5.4 5.1 4.1 9.6 5.5  HGB 8.6* 8.0* 8.0* 8.6* 8.5*  HCT 24.7* 22.8* 23.2* 25.2* 25.5*  PLT 102* 102* 123* 161 215  MCV 91.8 91.2 90.3 92.3 95.1  MCH 32.0 32.0 31.1 31.5 31.7  MCHC 34.8 35.1 34.5 34.1 33.3  RDW 14.3 14.3 13.9 14.1 14.9  LYMPHSABS  --   --   --  0.7  --   MONOABS  --   --   --  1.3*  --   EOSABS  --   --   --  0.0  --   BASOSABS  --   --   --  0.0  --     Recent Labs  Lab 09/03/21 1635 09/04/21 0336 09/05/21 0303 09/06/21 0539 09/07/21 0548  NA 130* 130* 129* 130* 134*  K 3.7 3.5 3.4* 3.6 4.0  CL 97* 99 95* 96* 99  CO2 '26 23 25 25 24  '$ GLUCOSE 112* 102* 112* 100* 91  BUN 10 9 7* 7* 7*  CREATININE 1.06 1.09 1.00 1.02 1.04  CALCIUM 5.8*  6.3* 6.3* 6.1* 6.3*  AST 27 22  --   --   --   ALT 31 30  --   --   --   ALKPHOS 48 43  --   --   --   BILITOT 1.2 1.2  --   --   --   ALBUMIN 2.5* 2.4*  --  2.6* 2.6*  MG  --  1.5* 1.4*  --  1.7    ------------------------------------------------------------------------------------------------------------------ No results for input(s): "CHOL", "HDL", "LDLCALC", "TRIG", "CHOLHDL", "LDLDIRECT" in the last 72 hours.  Lab Results  Component Value Date   HGBA1C 6.1 (H) 08/31/2021   CBG: Recent Labs  Lab 09/06/21 1636 09/06/21 2117 09/07/21 0715 09/07/21 1131 09/07/21 1625  GLUCAP 122* 137* 105* 106* 111*    Recent Results (from the past 240 hour(s))  Blood Culture (routine x 2)     Status: None   Collection Time: 08/30/21  1:38 PM   Specimen: BLOOD  Result Value Ref Range Status   Specimen Description BLOOD BLOOD LEFT HAND  Final   Special Requests   Final    BOTTLES DRAWN AEROBIC AND ANAEROBIC Blood Culture results may not be optimal due to an inadequate volume of blood received in culture bottles   Culture   Final    NO GROWTH 5 DAYS Performed at [facility], [address] [place], [place] [address]    Report Status 09/04/2021 FINAL  Final  Urine Culture     Status: Abnormal   Collection Time: 08/30/21  1:38 PM   Specimen: Urine, Suprapubic  Result Value Ref Range Status   Specimen Description   Final    URINE, SUPRAPUBIC Performed at [facility], [address], [place], [place] [address]    Special Requests   Final    NONE Performed at [facility], [address], [place], [place] [address]    Culture (A)  Final    >=100,000 COLONIES/mL KLEBSIELLA OXYTOCA 80,000 COLONIES/mL PSEUDOMONAS AERUGINOSA    Report Status 09/02/2021 FINAL  Final   Organism ID, Bacteria KLEBSIELLA OXYTOCA (A)  Final   Organism ID, Bacteria PSEUDOMONAS AERUGINOSA (A)  Final      Susceptibility   Klebsiella oxytoca - MIC*    AMPICILLIN RESISTANT Resistant     CEFAZOLIN <=4  SENSITIVE Sensitive     CEFEPIME <=0.12 SENSITIVE Sensitive     CEFTRIAXONE <=0.25 SENSITIVE Sensitive     CIPROFLOXACIN <=0.25 SENSITIVE Sensitive     GENTAMICIN <=1 SENSITIVE Sensitive     IMIPENEM <=0.25 SENSITIVE Sensitive     NITROFURANTOIN 64 INTERMEDIATE  Intermediate     TRIMETH/SULFA <=20 SENSITIVE Sensitive     AMPICILLIN/SULBACTAM 8 SENSITIVE Sensitive     PIP/TAZO 16 SENSITIVE Sensitive     * >=100,000 COLONIES/mL KLEBSIELLA OXYTOCA   Pseudomonas aeruginosa - MIC*    CEFTAZIDIME <=1 SENSITIVE Sensitive     CIPROFLOXACIN 0.5 SENSITIVE Sensitive     GENTAMICIN 4 SENSITIVE Sensitive     IMIPENEM 1 SENSITIVE Sensitive     PIP/TAZO <=4 SENSITIVE Sensitive     * 80,000 COLONIES/mL PSEUDOMONAS AERUGINOSA  Blood Culture (routine x 2)     Status: None   Collection Time: 08/30/21  2:18 PM   Specimen: BLOOD  Result Value Ref Range Status   Specimen Description BLOOD BLOOD RIGHT HAND  Final   Special Requests   Final    BOTTLES DRAWN AEROBIC AND ANAEROBIC Blood Culture results may not be optimal due to an excessive volume of blood received in culture bottles   Culture   Final    NO GROWTH 5 DAYS Performed at [facility], [address], [place], [place] [address]    Report Status 09/04/2021 FINAL  Final  SARS Coronavirus 2 by RT PCR (hospital order, performed in [facility] hospital lab) *cepheid single result test* Anterior Nasal Swab     Status: None   Collection Time: 08/30/21  4:29 PM   Specimen: Anterior Nasal Swab  Result Value Ref Range Status   SARS Coronavirus 2 by RT PCR NEGATIVE NEGATIVE Final    Comment: (NOTE) SARS-CoV-2 target nucleic acids are NOT DETECTED.  The SARS-CoV-2 RNA is generally detectable in upper and lower respiratory specimens during the acute phase of infection. The lowest concentration of SARS-CoV-2 viral copies this assay can detect is 250 copies / mL. A negative result does not preclude SARS-CoV-2 infection and should not be used as the sole  basis for treatment or other patient management decisions.  A negative result may occur with improper specimen collection / handling, submission of specimen other than nasopharyngeal swab, presence of viral mutation(s) within the areas targeted by this assay, and inadequate number of viral copies (<250 copies / mL). A negative result must be combined with clinical observations, patient history, and epidemiological information.  Fact Sheet for Patients:   https://www.patel.info/  Fact Sheet for Healthcare Providers: https://hall.com/  This test is not yet approved or  cleared by the [place] FDA and has been authorized for detection and/or diagnosis of SARS-CoV-2 by FDA under an Emergency Use Authorization (EUA).  This EUA will remain in effect (meaning this test can be used) for the duration of the COVID-19 declaration under Section 564(b)(1) of the Act, 21 U.S.C. section 360bbb-3(b)(1), unless the authorization is terminated or revoked sooner.  Performed at [facility], [address], [place], [place] [address]   MRSA Next Gen by PCR, Nasal     Status: None   Collection Time: 08/30/21  5:48 PM   Specimen: Urine, Suprapubic; Nasal Swab  Result Value Ref Range Status   MRSA by PCR Next Gen NOT DETECTED NOT DETECTED Final    Comment: (NOTE) The GeneXpert MRSA Assay (FDA approved for NASAL specimens only), is one component of a comprehensive MRSA colonization surveillance program. It is not intended to diagnose MRSA infection nor to guide or monitor treatment for MRSA infections. Test performance is not FDA approved in patients less than 36 years old. Performed at [facility], [address], [place], [place] [address]      Roxan Hockey, MD  [facility]  Between 7 am - 7 pm I am available, please contact me via Amion (for emergencies) or Securechat (non urgent messages)  Between 7 pm - 7 am I am not available, please  contact night coverage MD/APP via Amion

## 2021-09-08 ENCOUNTER — Telehealth: Payer: Self-pay | Admitting: Gastroenterology

## 2021-09-08 ENCOUNTER — Inpatient Hospital Stay (HOSPITAL_COMMUNITY): Payer: Medicare HMO

## 2021-09-08 DIAGNOSIS — K922 Gastrointestinal hemorrhage, unspecified: Secondary | ICD-10-CM | POA: Diagnosis not present

## 2021-09-08 DIAGNOSIS — R579 Shock, unspecified: Secondary | ICD-10-CM | POA: Diagnosis not present

## 2021-09-08 DIAGNOSIS — D62 Acute posthemorrhagic anemia: Secondary | ICD-10-CM | POA: Diagnosis not present

## 2021-09-08 LAB — RENAL FUNCTION PANEL
Albumin: 2.6 g/dL — ABNORMAL LOW (ref 3.5–5.0)
Anion gap: 7 (ref 5–15)
BUN: 7 mg/dL — ABNORMAL LOW (ref 8–23)
CO2: 29 mmol/L (ref 22–32)
Calcium: 6.2 mg/dL — CL (ref 8.9–10.3)
Chloride: 98 mmol/L (ref 98–111)
Creatinine, Ser: 1.07 mg/dL (ref 0.61–1.24)
GFR, Estimated: >60 mL/min (ref >60)
Glucose, Bld: 105 mg/dL — ABNORMAL HIGH (ref 70–99)
Phosphorus: 4.7 mg/dL — ABNORMAL HIGH (ref 2.5–4.6)
Potassium: 3.6 mmol/L (ref 3.5–5.1)
Sodium: 134 mmol/L — ABNORMAL LOW (ref 135–145)

## 2021-09-08 LAB — GLUCOSE, CAPILLARY
Glucose-Capillary: 111 mg/dL — ABNORMAL HIGH (ref 70–99)
Glucose-Capillary: 182 mg/dL — ABNORMAL HIGH (ref 70–99)
Glucose-Capillary: 94 mg/dL (ref 70–99)
Glucose-Capillary: 124 mg/dL — ABNORMAL HIGH (ref 70–99)

## 2021-09-08 MED ORDER — APIXABAN 5 MG PO TABS
5.0000 mg | ORAL_TABLET | Freq: Two times a day (BID) | ORAL | Status: DC
  Administered 2021-09-08 – 2021-09-09 (×2): 5 mg via ORAL
  Filled 2021-09-08 (×3): qty 1

## 2021-09-08 MED ORDER — CALCIUM GLUCONATE-NACL 1-0.675 GM/50ML-% IV SOLN
1.0000 g | Freq: Once | INTRAVENOUS | Status: AC
Start: 2021-09-08 — End: 2021-09-08
  Administered 2021-09-08: 1000 mg via INTRAVENOUS
  Filled 2021-09-08: qty 50

## 2021-09-08 MED ORDER — LEVOTHYROXINE SODIUM 75 MCG PO TABS
150.0000 ug | ORAL_TABLET | Freq: Every day | ORAL | Status: DC
Start: 2021-09-08 — End: 2021-09-09
  Administered 2021-09-08 – 2021-09-09 (×2): 150 ug via ORAL
  Filled 2021-09-08 (×2): qty 2

## 2021-09-08 NOTE — NC FL2 (Signed)
[place] LEVEL OF CARE SCREENING TOOL     IDENTIFICATION  Patient Name: Paul Bradley Birthdate: 12-17-1945 Sex: male Admission Date (Current Location): 08/30/2021  [facility] and [place] Number:  [employer] and Address:  [place] [address] [place]      Provider Number: [id number]  Attending Physician Name and Address:  Roxan Hockey, MD  Relative Name and Phone Number:       Current Level of Care: Hospital Recommended Level of Care: [place] Prior Approval Number:    Date Approved/Denied:   PASRR Number: [id number]  Discharge Plan: SNF    Current Diagnoses: Patient Active Problem List   Diagnosis Date Noted   Hypocalcemia    Hyponatremia    Abnormal blood electrolyte level 08/31/2021   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[place]) 08/30/2021   Pressure injury of skin 08/30/2021   Thyroid cancer ([place]) 08/30/2021   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 ([place]) 08/30/2021   Nausea and vomiting 08/30/2021   Blood in stool 08/30/2021   UTI (urinary tract infection) 02/11/2021   Overweight (BMI 25.0-29.9) 02/11/2021   Hypotension 02/11/2021   Lactic acidosis 02/11/2021   Diabetes mellitus without complication (HCC)    BPH (benign prostatic hyperplasia)    Hypertension    Coronary artery disease    Suprapubic catheter (HCC)    Acute lower UTI    GI bleed 06/08/2018   Diverticulosis 06/08/2018   ABLA (acute blood loss anemia) 06/08/2018   Chronic a-fib on Eliquis 06/08/2018   Anticoagulant long-term use 06/08/2018   Diarrhea    Generalized weakness 06/05/2018    Orientation RESPIRATION BLADDER Height & Weight     Self, Situation, Time, Place  O2 (2L) Indwelling catheter Weight: 212 lb 8.4 oz (96.4 kg) Height:  '6\' 1"'$  (185.4 cm)  BEHAVIORAL SYMPTOMS/MOOD NEUROLOGICAL BOWEL NUTRITION STATUS      Incontinent Diet (Soft diet. See d/c summary for updates.)  AMBULATORY STATUS COMMUNICATION OF NEEDS Skin    Extensive Assist Verbally Bruising, Other (Comment) (Stage II to coccyx with foam dressing. Stage I to left buttocks with foam dressing.)                       Personal Care Assistance Level of Assistance  Bathing, Feeding, Dressing Bathing Assistance: Maximum assistance Feeding assistance: Limited assistance Dressing Assistance: Maximum assistance     Functional Limitations Info  Sight, Hearing, Speech Sight Info: Adequate Hearing Info: Impaired Speech Info: Adequate    SPECIAL CARE FACTORS FREQUENCY  PT (By licensed PT)     PT Frequency: 5x weekly              Contractures      Additional Factors Info  Code Status, Allergies Code Status Info: DNR Allergies Info: Penicillins           Current Medications (09/08/2021):  This is the current hospital active medication list Current Facility-Administered Medications  Medication Dose Route Frequency Provider Last Rate Last Admin   acetaminophen (TYLENOL) tablet 650 mg  650 mg Oral Q6H PRN Rourk, [name], MD       apixaban Arne [place]) tablet 5 mg  5 mg Oral BID Emokpae, Courage, MD       bisacodyl (DULCOLAX) suppository 10 mg  10 mg Rectal Daily PRN Daneil Dolin, MD       calcitRIOL (ROCALTROL) capsule 0.5 mcg  0.5 mcg Oral Daily Rourk, [name], MD   0.5 mcg  at 09/08/21 0846   calcium carbonate (OS-CAL - dosed in mg of elemental calcium) tablet 1,250 mg  1 tablet Oral BID WC Daneil Dolin, MD   1,250 mg at 09/08/21 0845   Chlorhexidine Gluconate Cloth 2 % PADS 6 each  6 each Topical Daily Daneil Dolin, MD   6 each at 09/08/21 0846   cholecalciferol (VITAMIN D3) 25 MCG (1000 UNIT) tablet 1,000 Units  1,000 Units Oral Daily Daneil Dolin, MD   1,000 Units at 09/08/21 0846   guaiFENesin-dextromethorphan (ROBITUSSIN DM) 100-10 MG/5ML syrup 5 mL  5 mL Oral Q4H PRN Daneil Dolin, MD   5 mL at 09/07/21 2117   insulin aspart (novoLOG) injection 0-15 Units  0-15 Units Subcutaneous TID WC Daneil Dolin, MD   2  Units at 09/06/21 1731   ipratropium-albuterol (DUONEB) 0.5-2.5 (3) MG/3ML nebulizer solution 3 mL  3 mL Nebulization Q6H PRN Rourk, [name], MD       labetalol (NORMODYNE) injection 10 mg  10 mg Intravenous Q4H PRN Daneil Dolin, MD   10 mg at 09/05/21 1229   levothyroxine (SYNTHROID) tablet 150 mcg  150 mcg Oral Q0600 Roxan Hockey, MD   150 mcg at 09/08/21 0845   metoprolol succinate (TOPROL-XL) 24 hr tablet 25 mg  25 mg Oral Daily Daneil Dolin, MD   25 mg at 09/08/21 0846   metoprolol tartrate (LOPRESSOR) injection 5 mg  5 mg Intravenous Q4H PRN Daneil Dolin, MD       midodrine (PROAMATINE) tablet 10 mg  10 mg Oral TID WC Daneil Dolin, MD   10 mg at 09/05/21 0800   morphine (PF) 2 MG/ML injection 2 mg  2 mg Intravenous Q4H PRN Daneil Dolin, MD       ondansetron [facility]) tablet 8 mg  8 mg Oral Q6H PRN Daneil Dolin, MD       Or   ondansetron (ZOFRAN) 8 mg in sodium chloride 0.9 % 50 mL IVPB  8 mg Intravenous Q6H PRN Daneil Dolin, MD   Stopped at 08/31/21 0937   ondansetron (ZOFRAN) injection 4 mg  4 mg Intravenous Q12H Daneil Dolin, MD   4 mg at 09/08/21 0845   Oral care mouth rinse  15 mL Mouth Rinse PRN Rourk, [name], MD       pantoprazole (PROTONIX) EC tablet 40 mg  40 mg Oral BID AC Daneil Dolin, MD   40 mg at 09/08/21 0845   sodium chloride flush (NS) 0.9 % injection 3 mL  3 mL Intravenous Q12H Daneil Dolin, MD   3 mL at 09/08/21 0848   sodium chloride tablet 1 g  1 g Oral Daily Daneil Dolin, MD   1 g at 09/08/21 2979     Discharge Medications: Please see discharge summary for a list of discharge medications.  Relevant Imaging Results:  Relevant Lab Results:   Additional Information SSN: [id number]  Salome Arnt, LCSW

## 2021-09-08 NOTE — Progress Notes (Addendum)
PROGRESS NOTE  Paul Bradley JGG:[id number] DOB: [birth year] DOA: 08/30/2021 PCP: [name], MD   LOS: 9 days   Brief Narrative / Interim history: 76 year old male with history of recurrent, metastatic iodine refractory papillary thyroid carcinoma, no longer on treatment since May, suprapubic catheter, second toe osteomyelitis currently on linezolid twice daily, DM2, HTN, chronic A-fib on Eliquis comes into the hospital with weakness.  Since he has been on linezolid over the last month, he has been having poor appetite, diarrhea, and was dehydrated several times in the last month.  He has also been complaining of significant weakness.  Hospital course complicated by progressive anemia, blood in the stool, and cardiology was consulted.  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  No clear-cut overt GI bleeding and his hemoglobin drop has been over the past 6 months.  Gastroenterology consulted and following, they are planning to do endoscopic evaluation however they wish for his electrolytes to be corrected prior to anesthesia --s/p EGD and colonoscopy on 09/06/2021 --colonoscopy was unremarkable, EGD showed gastric  erosions. Marked edema and erosions of the proximal duodenum - suspect site of bleeding in the setting of anticoagulation -GI consulted and recommends Protonix twice daily for 1 month and then daily after that -Esophageal evaluation revealed no significant findings esophagus was dilated during procedure -HGB currently above 8   Active problems -Dysphagia and concerns about possible aspiration--- speech pathology eval appreciated -had EGD on 09/03/2021--Esophageal evaluation revealed no significant findings esophagus was dilated during procedure Defer to speech pathologist if full MBBS needed on 09/08/2021  -Swallowing improving post EGD with dilatation  Generalized weakness-in the setting of hypovolemia and dehydration, also concern for a component of blood loss.  May be related to anemia -Physical therapy eval appreciated recommends SNF rehab  Anemia of chronic disease now with superimposed acute blood loss anemia- -EGD and GI work-up as above #1  Thrombocytopenia -new, platelets have been normal up until this hospitalization, I wonder whether it is a consumptive process.  Stable over the last 5 days, continue to monitor  Osteomyelitis of second toe left foot-status post amputation last month, has been on suppressive antibiotics with plans to stop on 8/29.  He has healed adequately and there is no sign of infection, previous hospitalist decided to hold any further treatment with Zyvox.  He remains afebrile and WBC is normal  Thyroid cancer-outpatient follow-up  Hypocalcemia-chronic, c/n home calcitriol as well as vitamin D , --continue calcium repletion oral and IV,   Chronic A-fib-continue metoprolol for rate control,  -Eliquis is on hold due to GI bleed -Per GI service may resume Eliquis on or after 09/08/2021  Essential hypertension-hypotension has resolved, midodrine discontinued -Continue metoprolol for rate control if BP tolerates,   -Pressure  injury of skin, POA-stage I left  buttocks and stage II medial aspect coccyx area  Suprapubic catheter-chronic, due to be exchanged on 8/22, Dr. Dyann Kief reached out to urology based on his prior notes -Hopefully catheter can be changed prior to discharge  Hyponatremia/hypokalemia/hypomagnesemia-hydrate and replace electrolytes  Disposition--awaiting urologist to help with suprapubic Foley catheter change on 09/08/2021, -awaiting transfer to SNF rehab  Scheduled Meds:  apixaban  5 mg Oral BID   calcitRIOL  0.5 mcg Oral Daily   calcium carbonate  1 tablet Oral BID WC   Chlorhexidine Gluconate Cloth  6 each Topical Daily   cholecalciferol  1,000 Units Oral Daily   insulin aspart  0-15 Units Subcutaneous TID WC   levothyroxine  150 mcg Oral Q0600   metoprolol succinate  25 mg Oral Daily   midodrine  10 mg Oral TID WC   ondansetron (ZOFRAN) IV  4 mg Intravenous Q12H   pantoprazole  40 mg Oral BID AC   sodium chloride flush  3 mL Intravenous Q12H   sodium chloride  1 g Oral Daily   Continuous Infusions:  ondansetron (ZOFRAN) IV Stopped (08/31/21 0937)   PRN Meds:.acetaminophen, bisacodyl, guaiFENesin-dextromethorphan, ipratropium-albuterol, labetalol, metoprolol tartrate, morphine injection, ondansetron **OR** ondansetron (ZOFRAN) IV, mouth rinse  Diet Orders (From admission, onward)     Start     Ordered   09/06/21 1909  DIET SOFT Room service appropriate? Yes; Fluid consistency: Thin  Diet effective now       Question Answer Comment  Room service appropriate? Yes   Fluid consistency: Thin      09/06/21 1908           DVT prophylaxis: Place and maintain sequential compression device Start: 08/30/21 2111 apixaban (ELIQUIS) tablet 5 mg   Lab Results  Component Value Date   PLT 215 09/07/2021     Code Status: DNR  Family Communication: Patient's sister  Status is: Inpatient Remains inpatient appropriate because: Anemia, hyocalcemia, hyponatremia  Level of care: Telemetry  Consultants:   Gastroenterology  Objective: Vitals:   09/07/21 1515 09/07/21 2044 09/08/21 0434 09/08/21 1609  BP: 135/75 117/70 128/79 (!) 136/103  Pulse: 76 72 81 97  Resp: '20 17 17 20  '$ Temp: 98.3 F (36.8 C) 98 F (36.7 C) 98.4 F (36.9 C) 98.2 F (36.8 C)  TempSrc: Oral   Oral  SpO2: 100% 96% 100% 91%  Weight:      Height:        Intake/Output Summary (Last 24 hours) at 09/08/2021 2039 Last data filed at 09/08/2021 0112 Gross per 24 hour  Intake --  Output 1000 ml  Net -1000 ml   Wt Readings from Last 3 Encounters:  09/01/21 96.4 kg  07/21/21 90.3 kg  02/11/21 89.8 kg   Physical Exam  Gen:- Awake Alert, in no acute distress  HEENT:- [place].AT, No sclera icterus Nose- [place] 2L/min Neck-Supple Neck,No JVD,.  Lungs-improving air movement, no wheezing CV- S1, S2 normal, RRR Abd-  +ve B.Sounds, Abd Soft, No tenderness,    Extremity/Skin:- trace  edema,   good pedal pulses  Psych-affect is appropriate, oriented x3 Neuro-generalized weakness no new focal deficits, no tremors GU-suprapubic catheter in situ   Data Reviewed: I have independently reviewed following labs and imaging studies   CBC Recent Labs  Lab 09/02/21 0304 09/04/21 0336 09/05/21 0303 09/07/21 0548  WBC 5.1 4.1 9.6 5.5  HGB 8.0* 8.0* 8.6* 8.5*  HCT 22.8* 23.2* 25.2* 25.5*  PLT 102* 123* 161 215  MCV  91.2 90.3 92.3 95.1  MCH 32.0 31.1 31.5 31.7  MCHC 35.1 34.5 34.1 33.3  RDW 14.3 13.9 14.1 14.9  LYMPHSABS  --   --  0.7  --   MONOABS  --   --  1.3*  --   EOSABS  --   --  0.0  --   BASOSABS  --   --  0.0  --     Recent Labs  Lab 09/03/21 1635 09/04/21 0336 09/05/21 0303 09/06/21 0539 09/07/21 0548 09/08/21 0425  NA 130* 130* 129* 130* 134* 134*  K 3.7 3.5 3.4* 3.6 4.0 3.6  CL 97* 99 95* 96* 99 98  CO2 '26 23 25 25 24 29  '$ GLUCOSE 112* 102* 112* 100* 91 105*  BUN 10 9 7* 7* 7* 7*  CREATININE 1.06 1.09 1.00 1.02 1.04 1.07  CALCIUM 5.8* 6.3* 6.3* 6.1* 6.3* 6.2*  AST 27 22  --   --   --   --   ALT 31  30  --   --   --   --   ALKPHOS 48 43  --   --   --   --   BILITOT 1.2 1.2  --   --   --   --   ALBUMIN 2.5* 2.4*  --  2.6* 2.6* 2.6*  MG  --  1.5* 1.4*  --  1.7  --     ------------------------------------------------------------------------------------------------------------------ No results for input(s): "CHOL", "HDL", "LDLCALC", "TRIG", "CHOLHDL", "LDLDIRECT" in the last 72 hours.  Lab Results  Component Value Date   HGBA1C 6.1 (H) 08/31/2021   CBG: Recent Labs  Lab 09/07/21 1625 09/07/21 2046 09/08/21 0746 09/08/21 1140 09/08/21 1610  GLUCAP 111* 175* 111* 182* 94    Recent Results (from the past 240 hour(s))  Blood Culture (routine x 2)     Status: None   Collection Time: 08/30/21  1:38 PM   Specimen: BLOOD  Result Value Ref Range Status   Specimen Description BLOOD BLOOD LEFT HAND  Final   Special Requests   Final    BOTTLES DRAWN AEROBIC AND ANAEROBIC Blood Culture results may not be optimal due to an inadequate volume of blood received in culture bottles   Culture   Final    NO GROWTH 5 DAYS Performed at [facility], [address], [place], [place] [address]    Report Status 09/04/2021 FINAL  Final  Urine Culture     Status: Abnormal   Collection Time: 08/30/21  1:38 PM   Specimen: Urine, Suprapubic  Result Value Ref Range Status   Specimen Description   Final    URINE, SUPRAPUBIC Performed at [facility], [address] [place], [place] [address]    Special Requests   Final    NONE Performed at [facility], [address], [place], [place] [address]    Culture (A)  Final    >=100,000 COLONIES/mL KLEBSIELLA OXYTOCA 80,000 COLONIES/mL PSEUDOMONAS AERUGINOSA    Report Status 09/02/2021 FINAL  Final   Organism ID, Bacteria KLEBSIELLA OXYTOCA (A)  Final   Organism ID, Bacteria PSEUDOMONAS AERUGINOSA (A)  Final      Susceptibility   Klebsiella oxytoca - MIC*    AMPICILLIN RESISTANT Resistant     CEFAZOLIN <=4 SENSITIVE Sensitive     CEFEPIME <=0.12  SENSITIVE Sensitive     CEFTRIAXONE <=0.25 SENSITIVE Sensitive     CIPROFLOXACIN <=0.25 SENSITIVE Sensitive     GENTAMICIN <=1 SENSITIVE Sensitive     IMIPENEM <=0.25  SENSITIVE Sensitive     NITROFURANTOIN 64 INTERMEDIATE Intermediate     TRIMETH/SULFA <=20 SENSITIVE Sensitive     AMPICILLIN/SULBACTAM 8 SENSITIVE Sensitive     PIP/TAZO 16 SENSITIVE Sensitive     * >=100,000 COLONIES/mL KLEBSIELLA OXYTOCA   Pseudomonas aeruginosa - MIC*    CEFTAZIDIME <=1 SENSITIVE Sensitive     CIPROFLOXACIN 0.5 SENSITIVE Sensitive     GENTAMICIN 4 SENSITIVE Sensitive     IMIPENEM 1 SENSITIVE Sensitive     PIP/TAZO <=4 SENSITIVE Sensitive     * 80,000 COLONIES/mL PSEUDOMONAS AERUGINOSA  Blood Culture (routine x 2)     Status: None   Collection Time: 08/30/21  2:18 PM   Specimen: BLOOD  Result Value Ref Range Status   Specimen Description BLOOD BLOOD RIGHT HAND  Final   Special Requests   Final    BOTTLES DRAWN AEROBIC AND ANAEROBIC Blood Culture results may not be optimal due to an excessive volume of blood received in culture bottles   Culture   Final    NO GROWTH 5 DAYS Performed at [facility], [address], [place], [place] [address]    Report Status 09/04/2021 FINAL  Final  SARS Coronavirus 2 by RT PCR (hospital order, performed in [facility] hospital lab) *cepheid single result test* Anterior Nasal Swab     Status: None   Collection Time: 08/30/21  4:29 PM   Specimen: Anterior Nasal Swab  Result Value Ref Range Status   SARS Coronavirus 2 by RT PCR NEGATIVE NEGATIVE Final    Comment: (NOTE) SARS-CoV-2 target nucleic acids are NOT DETECTED.  The SARS-CoV-2 RNA is generally detectable in upper and lower respiratory specimens during the acute phase of infection. The lowest concentration of SARS-CoV-2 viral copies this assay can detect is 250 copies / mL. A negative result does not preclude SARS-CoV-2 infection and should not be used as the sole basis for treatment or other patient  management decisions.  A negative result may occur with improper specimen collection / handling, submission of specimen other than nasopharyngeal swab, presence of viral mutation(s) within the areas targeted by this assay, and inadequate number of viral copies (<250 copies / mL). A negative result must be combined with clinical observations, patient history, and epidemiological information.  Fact Sheet for Patients:   https://www.patel.info/  Fact Sheet for Healthcare Providers: https://hall.com/  This test is not yet approved or  cleared by the [place] FDA and has been authorized for detection and/or diagnosis of SARS-CoV-2 by FDA under an Emergency Use Authorization (EUA).  This EUA will remain in effect (meaning this test can be used) for the duration of the COVID-19 declaration under Section 564(b)(1) of the Act, 21 U.S.C. section 360bbb-3(b)(1), unless the authorization is terminated or revoked sooner.  Performed at [facility], [address] [place], [place] [address]   MRSA Next Gen by PCR, Nasal     Status: None   Collection Time: 08/30/21  5:48 PM   Specimen: Urine, Suprapubic; Nasal Swab  Result Value Ref Range Status   MRSA by PCR Next Gen NOT DETECTED NOT DETECTED Final    Comment: (NOTE) The GeneXpert MRSA Assay (FDA approved for NASAL specimens only), is one component of a comprehensive MRSA colonization surveillance program. It is not intended to diagnose MRSA infection nor to guide or monitor treatment for MRSA infections. Test performance is not FDA approved in patients less than 3 years old. Performed at [facility], [address], [place], [place] [address]  Roxan Hockey, MD  [facility]  Between 7 am - 7 pm I am available, please contact me via [place] (for emergencies) or Securechat (non urgent messages)  Between 7 pm - 7 am I am not available, please contact night coverage MD/APP via  Amion

## 2021-09-08 NOTE — Evaluation (Signed)
Physical Therapy Evaluation Patient Details Name: Paul Bradley MRN: [id number] DOB: 1945-12-29 Today's Date: 09/08/2021  History of Present Illness  Paul Bradley is a 76 y.o. 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 twice daily, diabetes mellitus type 2, hypertension, chronic atrial fibrillation on Eliquis who presented today via EMS 3 days after being discharged from [facility] with concerns for weakness.  Patient has multiple medical issues but recently his biggest issue has been the osteomyelitis of his second toe.  He has been on linezolid and since he started that medication has had terrible stomach problems.  His appetite has been very poor and his p.o. intake has been very poor.  Notably he was not taking any probiotics but was eating yogurt.  He has become dehydrated several times since starting this medication.  EMS today felt the patient was weaker than usual even after his recent hospitalization as they are familiar with him.  Is a poor historian and other than neck pain has no acute complaints however his blood pressure was markedly low in the emergency department and he was started on several boluses of IV fluid and placed in Trendelenburg due to hypotension.  Family members report that the patient is weaker than usual today.  Patient was tachycardic with a heart rate in the 140s and blood pressures in the 70s when he was initially picked up by EMS.   Clinical Impression  Patient unsteady with frequent leaning on nearby objects for support when using quad-cane, required use of RW for safety and demonstrated slow labored cadence without loss of balance and limited mostly due to c/o fatigue.  Patient on room air during ambulation with SpO2 averaging 87-88%, once seated at bedside, SpO2 increased to 93% and left on room  air.  Patient will benefit from continued skilled physical therapy in hospital and recommended venue below to increase strength, balance, endurance for safe ADLs and gait.      Recommendations for follow up therapy are one component of a multi-disciplinary discharge planning process, led by the attending physician.  Recommendations may be updated based on patient status, additional functional criteria and insurance authorization.  Follow Up Recommendations Skilled nursing-short term rehab (<3 hours/day) Can patient physically be transported by private vehicle: Yes    Assistance Recommended at Discharge Set up Supervision/Assistance  Patient can return home with the following  A little help with walking and/or transfers;A little help with bathing/dressing/bathroom;Help with stairs or ramp for entrance;Assistance with cooking/housework    Equipment Recommendations None recommended by PT  Recommendations for Other Services       Functional Status Assessment Patient has had a recent decline in their functional status and demonstrates the ability to make significant improvements in function in a reasonable and predictable amount of time.     Precautions / Restrictions Precautions Precautions: Fall Restrictions Weight Bearing Restrictions: No      Mobility  Bed Mobility Overal bed mobility: Needs Assistance Bed Mobility: Supine to Sit     Supine to sit: Supervision     General bed mobility comments: increased time, labored movement    Transfers Overall transfer level: Needs assistance Equipment used: Rolling walker (2 wheels), Quad cane Transfers: Sit to/from Stand, Bed to chair/wheelchair/BSC Sit to Stand: Min guard   Step pivot transfers: Min guard       General  transfer comment: increased time, labored movement, has to lean on nearby objects for support whne using quad-cane, safer using RW    Ambulation/Gait Ambulation/Gait assistance: Min guard, Min assist Gait  Distance (Feet): 45 Feet Assistive device: Rolling walker (2 wheels) Gait Pattern/deviations: Decreased step length - right, Decreased step length - left, Decreased stride length, Trunk flexed Gait velocity: decreased     General Gait Details: slow labored cadence without loss of balance, limited mostly due to fatigue  Stairs            Wheelchair Mobility    Modified Rankin (Stroke Patients Only)       Balance Overall balance assessment: Needs assistance Sitting-balance support: Feet supported, No upper extremity supported Sitting balance-Leahy Scale: Good Sitting balance - Comments: seated at EOB   Standing balance support: During functional activity, Single extremity supported Standing balance-Leahy Scale: Poor Standing balance comment: fair/poor using quad-cane, fair using RW                             Pertinent Vitals/Pain Pain Assessment Pain Assessment: No/denies pain    Home Living Family/patient expects to be discharged to:: Private residence Living Arrangements: Alone Available Help at Discharge: Family;Available PRN/intermittently Type of Home: House Home Access: Ramped entrance     Alternate Level Stairs-Number of Steps: Patient states he does not go upstairs Home Layout: Two level Home Equipment: Conservation officer, nature (2 wheels);Cane - single point;BSC/3in1;Shower seat      Prior Function Prior Level of Function : Independent/Modified Independent             Mobility Comments: Household and short distanced community ambulator using [facility] ADLs Comments: Independent     Hand Dominance   Dominant Hand: Right    Extremity/Trunk Assessment   Upper Extremity Assessment Upper Extremity Assessment: Generalized weakness    Lower Extremity Assessment Lower Extremity Assessment: Generalized weakness    Cervical / Trunk Assessment Cervical / Trunk Assessment: Kyphotic  Communication   Communication: No difficulties  Cognition  Arousal/Alertness: Awake/alert Behavior During Therapy: WFL for tasks assessed/performed Overall Cognitive Status: Within Functional Limits for tasks assessed                                          General Comments      Exercises     Assessment/Plan    PT Assessment Patient needs continued PT services  PT Problem List Decreased strength;Decreased activity tolerance;Decreased balance;Decreased mobility       PT Treatment Interventions DME instruction;Gait training;Stair training;Functional mobility training;Therapeutic activities;Therapeutic exercise;Patient/family education;Balance training    PT Goals (Current goals can be found in the Care Plan section)  Acute Rehab PT Goals Patient Stated Goal: return home after rehab PT Goal Formulation: With patient Time For Goal Achievement: 09/22/21 Potential to Achieve Goals: Good    Frequency Min 3X/week     Co-evaluation               AM-PAC PT "6 Clicks" Mobility  Outcome Measure Help needed turning from your back to your side while in a flat bed without using bedrails?: None Help needed moving from lying on your back to sitting on the side of a flat bed without using bedrails?: A Little Help needed moving to and from a bed to a chair (including a wheelchair)?: A Little Help needed standing up from  a chair using your arms (e.g., wheelchair or bedside chair)?: A Little Help needed to walk in hospital room?: A Little Help needed climbing 3-5 steps with a railing? : A Lot 6 Click Score: 18    End of Session   Activity Tolerance: Patient tolerated treatment well;Patient limited by fatigue Patient left: in chair;with call bell/phone within reach Nurse Communication: Mobility status PT Visit Diagnosis: Unsteadiness on feet (R26.81);Other abnormalities of gait and mobility (R26.89);Muscle weakness (generalized) (M62.81)    Time: 0940-1005 PT Time Calculation (min) (ACUTE ONLY): 25 min   Charges:    PT Evaluation $PT Eval Moderate Complexity: 1 Mod PT Treatments $Therapeutic Activity: 23-37 mins        12:34 PM, 09/08/21 Lonell Grandchild, MPT Physical Therapist with [facility] [phone] office [phone] mobile phone

## 2021-09-08 NOTE — TOC Initial Note (Signed)
Transition of Care [facility]) - Initial/Assessment Note    Patient Details  Name: Paul Bradley MRN: [id number] Date of Birth: [birth year]  Transition [facility]) CM/SW Contact:    Salome Arnt, [place] Phone Number: 09/08/2021, 10:51 AM  Clinical Narrative:  Pt admitted due to vascular shock. Pt reports he lives alone. He is independent at baseline and ambulates with a cane. Pt indicates he is active with [facility] home [facility]. His sister provides transport to appointments. PT evaluated pt and recommend SNF. Discussed placement and pt states he has been to SNF in the past and is agreeable. Requesting [facility] or [place]. TOC will initiate bed search and start authorization.                   Expected Discharge Plan: Skilled Nursing Facility Barriers to Discharge: Continued Medical Work up   Patient Goals and CMS Choice Patient states their goals for this hospitalization and ongoing recovery are:: short term rehab   Choice offered to / list presented to : Patient  Expected Discharge Plan and Services Expected Discharge Plan: [place] In-house Referral: Clinical Social Work   Post Acute Care Choice: [place] Living arrangements for the past 2 months: [place]                                      Prior Living Arrangements/Services Living arrangements for the past 2 months: Single Family Home Lives with:: Self Patient language and need for interpreter reviewed:: Yes Do you feel safe going back to the place where you live?: Yes      Need for Family Participation in Patient Care: No (Comment)   Current home services: Home RN Criminal Activity/Legal Involvement Pertinent to Current Situation/Hospitalization: No - Comment as needed  Activities of Daily Living Home Assistive Devices/Equipment: Bedside commode/3-in-1, Cane (specify quad or straight), Shower chair with back (quad cane) ADL Screening (condition at time of  admission) Patient's cognitive ability adequate to safely complete daily activities?: Yes Is the patient deaf or have difficulty hearing?: No Does the patient have difficulty seeing, even when wearing glasses/contacts?: No Does the patient have difficulty concentrating, remembering, or making decisions?: No Patient able to express need for assistance with ADLs?: No Does the patient have difficulty dressing or bathing?: No Independently performs ADLs?: Yes (appropriate for developmental age) Does the patient have difficulty walking or climbing stairs?: Yes Weakness of Legs: Both Weakness of Arms/Hands: None  Permission Sought/Granted                  Emotional Assessment     Affect (typically observed): Appropriate Orientation: : Oriented to Self, Oriented to Place, Oriented to  Time, Oriented to Situation Alcohol / Substance Use: Not Applicable Psych Involvement: No (comment)  Admission diagnosis:  Hypocalcemia [E83.51] Hyponatremia [E87.1] Weakness [R53.1] 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[place]) [R57.9] Gastrointestinal hemorrhage, unspecified gastrointestinal hemorrhage type [K92.2] Patient Active Problem List   Diagnosis Date Noted   Hypocalcemia    Hyponatremia    Abnormal blood electrolyte level 08/31/2021   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[place]) 08/30/2021   Pressure injury of skin 08/30/2021   Thyroid cancer ([place]) 08/30/2021   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 ([place]) 08/30/2021   Nausea and vomiting 08/30/2021   Blood in stool 08/30/2021   UTI (urinary tract infection) 02/11/2021   Overweight (BMI 25.0-29.9) 02/11/2021   Hypotension 02/11/2021   Lactic acidosis  02/11/2021   Diabetes mellitus without complication (HCC)    BPH (benign prostatic hyperplasia)    Hypertension    Coronary artery disease    Suprapubic catheter (HCC)    Acute lower UTI    GI bleed 06/08/2018   Diverticulosis 06/08/2018   ABLA (acute blood loss anemia) 06/08/2018   Chronic a-fib on Eliquis 06/08/2018    Anticoagulant long-term use 06/08/2018   Diarrhea    Generalized weakness 06/05/2018   PCP:  [name], MD Pharmacy:   [place], [place] [address] Phone: [phone] Fax: [phone]     Social Determinants of Health (SDOH) Interventions    Readmission Risk Interventions    09/08/2021   10:49 AM  Readmission Risk Prevention Plan  Transportation Screening Complete  HRI or [place] Complete  Social Work Consult for [place] Planning/Counseling Complete  Palliative Care Screening Not Applicable  Medication Review Press photographer) Complete

## 2021-09-08 NOTE — Progress Notes (Signed)
Notified MD of critical value of potassium.  No new orders received.

## 2021-09-08 NOTE — Plan of Care (Signed)
  Problem: Acute Rehab PT Goals(only PT should resolve) Goal: Pt Will Go Supine/Side To Sit Outcome: Progressing Flowsheets (Taken 09/08/2021 1236) Pt will go Supine/Side to Sit:  with modified independence  with supervision Goal: Patient Will Transfer Sit To/From Stand Outcome: Progressing Flowsheets (Taken 09/08/2021 1236) Patient will transfer sit to/from stand:  with modified independence  with supervision Goal: Pt Will Transfer Bed To Chair/Chair To Bed Outcome: Progressing Flowsheets (Taken 09/08/2021 1236) Pt will Transfer Bed to Chair/Chair to Bed:  with supervision  with modified independence Goal: Pt Will Ambulate Outcome: Progressing Flowsheets (Taken 09/08/2021 1236) Pt will Ambulate:  75 feet  with modified independence  with supervision  with rolling walker  with least restrictive assistive device   12:38 PM, 09/08/21 Lonell Grandchild, MPT Physical Therapist with [facility] [phone] office [phone] mobile phone

## 2021-09-08 NOTE — TOC Progression Note (Signed)
Transition of Care [facility]) - Progression Note    Patient Details  Name: Paul Bradley MRN: [id number] Date of Birth: 01/01/1946  [facility]) CM/SW [place], [place] Phone Number: 09/08/2021, 3:39 PM  Clinical Narrative:    CSW spoke to Tami with [facility] who states that they are able to offer a bed for pt. Insurance Josem Kaufmann has been started. CSW spoke with pt who accepts bed offer for [facility] TOC to follow.   Expected Discharge Plan: [place] Barriers to Discharge: Continued Medical Work up  Expected Discharge Plan and Services Expected Discharge Plan: [place] In-house Referral: Clinical Social Work   Post Acute Care Choice: [place] Living arrangements for the past 2 months: Single Family Home                                       Social Determinants of Health (SDOH) Interventions    Readmission Risk Interventions    09/08/2021   10:49 AM  Readmission Risk Prevention Plan  Transportation Screening Complete  HRI or Home Care Consult Complete  Social Work Consult for [place] Planning/Counseling Complete  Palliative Care Screening Not Applicable  Medication Review Press photographer) Complete

## 2021-09-08 NOTE — Progress Notes (Signed)
Gastroenterology Progress Note     Patient ID: Paul Bradley; [id number]; 1945/01/22    Subjective   No abdominal pain, N/V. Tolerating diet. No dysphagia. No constipation/diarrhea. No overt GI bleeding.    Objective   Vital signs in last 24 hours Temp:  [98 F (36.7 C)-98.4 F (36.9 C)] 98.4 F (36.9 C) (08/28 0434) Pulse Rate:  [72-81] 81 (08/28 0434) Resp:  [17-20] 17 (08/28 0434) BP: (117-135)/(70-79) 128/79 (08/28 0434) SpO2:  [96 %-100 %] 100 % (08/28 0434) Last BM Date : 09/06/21  Physical Exam General:   Alert and oriented, pleasant Head:  Normocephalic and atraumatic. Abdomen:  Bowel sounds present, soft, obese, non-tender, non-distended.  Neurologic:  Alert and  oriented x4 Psych:  Alert and cooperative. Normal mood and affect.  Intake/Output from previous day: 08/27 0701 - 08/28 0700 In: -  Out: 1000 [Urine:1000] Intake/Output this shift: No intake/output data recorded.  Lab Results  Recent Labs    09/07/21 0548  WBC 5.5  HGB 8.5*  HCT 25.5*  PLT 215   BMET Recent Labs    09/06/21 0539 09/07/21 0548 09/08/21 0425  NA 130* 134* 134*  K 3.6 4.0 3.6  CL 96* 99 98  CO2 '25 24 29  '$ GLUCOSE 100* 91 105*  BUN 7* 7* 7*  CREATININE 1.02 1.04 1.07  CALCIUM 6.1* 6.3* 6.2*   LFT Recent Labs    09/06/21 0539 09/07/21 0548 09/08/21 0425  ALBUMIN 2.6* 2.6* 2.6*     Studies/Results DG Chest 2 View  Result Date: 09/08/2021 CLINICAL DATA:  Dyspnea and respiratory abnormalities. EXAM: CHEST - 2 VIEW COMPARISON:  AP chest 09/05/2021 and chest two views 08/31/2021 FINDINGS: There is new homogeneous opacification of the inferior 40% of the right hemithorax that appears to represent a moderate pleural effusion with associated heterogeneous airspace opacity. Small left pleural effusion is similar to prior. No pneumothorax is seen. There is again enlargement of the cardiac silhouette is silhouetted by the right pleural effusion. Moderate calcification  within the aortic arch. Mediastinal contours are unchanged. Surgical clips overlie the inferior neck and bilateral superior hemi-thoraces. Moderate multilevel degenerative disc changes of the thoracic spine. IMPRESSION: New moderate right pleural effusion and stable tiny left pleural effusion. Associated basilar lung opacities likely represent atelectasis. Recommend clinical correlation for possible pneumonia. Electronically Signed   By: Yvonne Kendall M.D.   On: 09/08/2021 08:18   DG CHEST PORT 1 VIEW  Result Date: 09/05/2021 CLINICAL DATA:  Shortness of breath EXAM: PORTABLE CHEST 1 VIEW COMPARISON:  Chest x-ray dated August 31, 2021 FINDINGS: Unchanged cardiomegaly. 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 Small layering left pleural effusion. Bibasilar atelectasis. No evidence of pneumothorax. Surgical clips noted at the neck base. IMPRESSION: Bibasilar atelectasis and small layering left pleural effusion. Electronically Signed   By: Yetta Glassman M.D.   On: 09/05/2021 12:42   DG Chest 2 View  Result Date: 08/31/2021 CLINICAL DATA:  Short of breath.  Follow-up study. EXAM: CHEST - 2 VIEW COMPARISON:  08/30/2021 and older exams. FINDINGS: Stable cardiomegaly. Persistent lung base opacities, right greater than left, consistent with small effusions and atelectasis. Remainder of the lungs is clear. No pneumothorax. IMPRESSION: 1. No significant change from the previous day's study. 2. Lung base opacities consistent with a combination of pleural effusions and atelectasis. Stable cardiomegaly. Electronically Signed   By: Lajean Manes M.D.   On: 08/31/2021 10:53   DG Chest [facility]  Result Date: 08/30/2021 CLINICAL DATA:  Questionable sepsis EXAM: PORTABLE CHEST 1 VIEW COMPARISON:  06/05/2018 FINDINGS: No focal consolidation. Trace bilateral pleural effusions. No pneumothorax. Stable cardiomegaly. No acute osseous abnormality. IMPRESSION: 1. Trace bilateral  pleural effusions. Electronically Signed   By: Kathreen Devoid M.D.   On: 08/30/2021 14:36    Assessment  76 y.o. male with a history of BPH, papillary thyroid cancer, A-fib on Eliquis, diabetes, coronary artery disease, osteomyelitis, hypertension, presenting this admission with worsening lightheadedness and fatigue.  Admitted 8/19 with shock in setting of hypovolemia, concern for blood loss; Gastroenterology was consulted as the patient presented significant drop in his hemoglobin down to 8.7 with positive FOBT.  Anemia: Hgb 8.7 on admission, previously 13.4 in July at outside facility. Received 2 units PRBCs this admission. EGD/colonoscopy completed over the weekend with mild Schatzki ring s/p dilation, gastric erosions, marked edema and erosions of proximal duodenum as suspected site of bleeding in setting of anticoagulation, s/p biopsy for H.pylori. Colonoscopy with sigmoid diverticulosis and non-bleeding internal hemorrhoids. Hgb holding steady. No overt GI bleeding.    Dysphagia: s/p dilation. Tolerating diet.   As he is doing well from a GI standpoint, we will sign off.     Plan / Recommendations  May resume anticoagulation today Continue PPI BID Follow-up on EGD pathology as comes available GI to sign off Will arrange outpatient follow-up    LOS: 9 days    09/08/2021, 8:23 AM  [name], PhD, ANP-BC [facility] Gastroenterology

## 2021-09-08 NOTE — Telephone Encounter (Signed)
Paul Bradley, patient needs a hospital follow-up in about 4 weeks. Thanks!

## 2021-09-09 DIAGNOSIS — R579 Shock, unspecified: Secondary | ICD-10-CM | POA: Diagnosis not present

## 2021-09-09 LAB — CBC
HCT: 25.4 % — ABNORMAL LOW (ref 39.0–52.0)
Hemoglobin: 8.5 g/dL — ABNORMAL LOW (ref 13.0–17.0)
MCH: 31.5 pg (ref 26.0–34.0)
MCHC: 33.5 g/dL (ref 30.0–36.0)
MCV: 94.1 fL (ref 80.0–100.0)
Platelets: 283 10*3/uL (ref 150–400)
RBC: 2.7 MIL/uL — ABNORMAL LOW (ref 4.22–5.81)
RDW: 15.8 % — ABNORMAL HIGH (ref 11.5–15.5)
WBC: 6 10*3/uL (ref 4.0–10.5)
nRBC: 0 % (ref 0.0–0.2)

## 2021-09-09 LAB — RENAL FUNCTION PANEL
Albumin: 2.5 g/dL — ABNORMAL LOW (ref 3.5–5.0)
Anion gap: 8 (ref 5–15)
BUN: 10 mg/dL (ref 8–23)
CO2: 27 mmol/L (ref 22–32)
Calcium: 6.1 mg/dL — CL (ref 8.9–10.3)
Chloride: 97 mmol/L — ABNORMAL LOW (ref 98–111)
Creatinine, Ser: 1.04 mg/dL (ref 0.61–1.24)
GFR, Estimated: >60 mL/min (ref >60)
Glucose, Bld: 108 mg/dL — ABNORMAL HIGH (ref 70–99)
Phosphorus: 4.6 mg/dL (ref 2.5–4.6)
Potassium: 3.5 mmol/L (ref 3.5–5.1)
Sodium: 132 mmol/L — ABNORMAL LOW (ref 135–145)

## 2021-09-09 LAB — GLUCOSE, CAPILLARY
Glucose-Capillary: 116 mg/dL — ABNORMAL HIGH (ref 70–99)
Glucose-Capillary: 136 mg/dL — ABNORMAL HIGH (ref 70–99)

## 2021-09-09 LAB — SURGICAL PATHOLOGY

## 2021-09-09 MED ORDER — CALCIUM CARBONATE 1250 (500 CA) MG PO TABS: 2.0000 | ORAL_TABLET | Freq: Two times a day (BID) | ORAL | 3 refills | Status: DC

## 2021-09-09 MED ORDER — METOPROLOL SUCCINATE ER 25 MG PO TB24: 25.0000 mg | ORAL_TABLET | Freq: Every day | ORAL | 2 refills | Status: DC

## 2021-09-09 MED ORDER — VITAMIN D3 25 MCG PO TABS
1000.0000 [IU] | ORAL_TABLET | Freq: Every day | ORAL | 3 refills | Status: DC
Start: 2021-09-10 — End: 2021-09-19

## 2021-09-09 MED ORDER — CALCITRIOL 0.5 MCG PO CAPS: 0.5000 ug | ORAL_CAPSULE | Freq: Every day | ORAL | 3 refills | Status: DC

## 2021-09-09 MED ORDER — CEFDINIR 300 MG PO CAPS: 300.0000 mg | ORAL_CAPSULE | Freq: Two times a day (BID) | ORAL | 0 refills | Status: AC

## 2021-09-09 MED ORDER — SODIUM CHLORIDE 1 G PO TABS: 1.0000 g | ORAL_TABLET | Freq: Every day | ORAL | 1 refills | Status: DC

## 2021-09-09 MED ORDER — LEVOTHYROXINE SODIUM 150 MCG PO TABS: 150.0000 ug | ORAL_TABLET | Freq: Every day | ORAL | 3 refills | Status: DC

## 2021-09-09 MED ORDER — PSYLLIUM 58.6 % PO PACK: 1.0000 | PACK | Freq: Every day | ORAL | 12 refills | Status: DC

## 2021-09-09 MED ORDER — PANTOPRAZOLE SODIUM 40 MG PO TBEC: 40.0000 mg | DELAYED_RELEASE_TABLET | Freq: Two times a day (BID) | ORAL | 3 refills | Status: DC

## 2021-09-09 MED ORDER — ENSURE ENLIVE PO LIQD: 237.0000 mL | Freq: Two times a day (BID) | ORAL | 12 refills | Status: DC

## 2021-09-09 MED ORDER — AZITHROMYCIN 500 MG PO TABS: 500.0000 mg | ORAL_TABLET | Freq: Every day | ORAL | 0 refills | Status: AC

## 2021-09-09 NOTE — Progress Notes (Signed)
Suprapubic catheter changed using sterile technique by Santa Lighter and [place].  Drain sponge place and new drainage bag and securement device placed.  New sacral foam applied.  Right arm continues to be bruised and edematous which patient stated happened in ED when he received blood

## 2021-09-09 NOTE — TOC Transition Note (Signed)
Transition of Care [facility]) - CM/SW Discharge Note   Patient Details  Name: Paul Bradley MRN: [id number] Date of Birth: May 14, 1945  Transition of Care [facility]) CM/SW Contact:  Ihor Gully, LCSW Phone Number: 09/09/2021, 10:30 AM   Clinical Narrative:    Discharge clinicals sent to facility. Sister notified. Insurance auth'd approved 8/28 - 8/30, next review 8/30, navi auth id [id number], plan auth id [id number].    Final next level of care: Skilled Nursing Facility Barriers to Discharge: Continued Medical Work up   Patient Goals and CMS Choice Patient states their goals for this hospitalization and ongoing recovery are:: short term rehab   Choice offered to / list presented to : Patient  Discharge Placement              Patient chooses bed at: [facility] Patient to be transferred to facility by: staff Name of family member notified: Abigail Miyamoto, sister Patient and family notified of of transfer: 09/09/21  Discharge Plan and Services In-house Referral: Clinical Social Work   Post Acute Care Choice: [place]                               Social Determinants of Health (SDOH) Interventions     Readmission Risk Interventions    09/08/2021   10:49 AM  Readmission Risk Prevention Plan  Transportation Screening Complete  HRI or Home Care Consult Complete  Social Work Consult for [place] Planning/Counseling Complete  Palliative Care Screening Not Applicable  Medication Review Press photographer) Complete

## 2021-09-09 NOTE — Discharge Summary (Signed)
Paul Bradley, is a 76 y.o. male  DOB [birth year]  MRN [id number].  Admission date:  08/30/2021  Admitting Physician  Lady Deutscher, MD  Discharge Date:  09/09/2021   Primary MD  [name], [name], MD  Recommendations for primary care physician for things to follow:   1)Avoid ibuprofen/Advil/Aleve/Motrin/Goody Powders/Naproxen/BC powders/Meloxicam/Diclofenac/Indomethacin and other Nonsteroidal anti-inflammatory medications as these will make you more likely to bleed and can cause stomach ulcers, can also cause Kidney problems.   2)Repeat CBC and Renal panel (including Albumin and Calcium) every Friday starting 09/12/2021  3)Soft Diet Advised--- consider chopped meats..... Patient remains at at least mild risk of Aspiration  4)Wound Care:-Pressure injury 08/30/21 Buttocks Left;Right;Other (Comment) Stage 1 -  Intact skin with non-blanchable redness of a localized area usually over a bony prominence. redness nonblanching to buttocks and gluteal cleft   Pressure Injury 08/30/21 Coccyx Upper;Medial Stage 2 -  Partial thickness loss of dermis presenting as a shallow open injury with a red, pink wound bed without slough. 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)   Blood in stool   Thyroid cancer ([place])   Diabetes mellitus without complication (HCC)   Chronic a-fib on  Eliquis   Hypertension   Pressure injury of skin   Suprapubic catheter (HCC)   ABLA (acute blood loss anemia)   Abnormal blood electrolyte level   Hypocalcemia   Hyponatremia      Past Medical History:  Diagnosis Date   Abscess of left hip    "healing wound" per [facility] Heme/Onc MD note 06/03/18   Assistance needed for mobility    walker   Atrial fibrillation ([place])    BPH (benign prostatic hyperplasia)    Cancer ([employer])    thyroid   Chronic anticoagulation    Coronary artery disease    Diabetes mellitus without complication ([place])    Hematochezia 05/05/2018   colonoscopy at [facility] with diverticulosis    Hypertension    Thyroid disease     Past Surgical History:  Procedure Laterality Date   THYROIDECTOMY       HPI  from the history and physical done on the day of admission:   Chief Complaint: Weakness and low blood pressure   HPI: Paul Bradley is a 76 y.o. 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 twice daily, diabetes mellitus type 2, hypertension, chronic atrial fibrillation on Eliquis who presented today via EMS 3 days after being  discharged from [facility] with concerns for weakness.  Patient has multiple medical issues but recently his biggest issue has been the osteomyelitis of his second toe.  He has been on linezolid and since he started that medication has had terrible stomach problems.  His appetite has been very poor and his p.o. intake has been very poor.  Notably he was not taking any probiotics but was eating yogurt.  He has become dehydrated several times since starting this medication.  EMS today felt the patient was weaker than usual even after his recent hospitalization as they are familiar with him.  Is a poor historian and other than neck pain has no acute complaints however his blood  pressure was markedly low in the emergency department and he was started on several boluses of IV fluid and placed in Trendelenburg due to hypotension.  Family members report that the patient is weaker than usual today.  Patient was tachycardic with a heart rate in the 140s and blood pressures in the 70s when he was initially picked up by EMS.     ED Course: Multiple liter boluses of normal saline with resultant blood pressure in the 80s."  Hemoccult in the emergency department found to be positive with a hemoglobin of 8.4 which is substantially a drop from February where it was approximately 12.  Sodium noted to be markedly low at 122.  1 unit of packed red blood cells was ordered.and pt referred to me for evaluation and admission.  Review of care everywhere shows hemoglobin on 8/15 was 10.3 it was 12.3 on 8/7 and was 9.2 on 8/16.   Review of Systems: As per HPI otherwise all other systems reviewed and  negative.    Hospital Course:   Assessment and Plan: Principal problem GI bleeding-patient was admitted with worsening anemia and fecal occult was positive.  No clear-cut overt GI bleeding and his hemoglobin drop has been over the past 6 months.  Gastroenterology consulted and following, they are planning to do endoscopic evaluation however they wish for his electrolytes to be corrected prior to anesthesia --s/p EGD and colonoscopy on 09/06/2021 --colonoscopy was unremarkable, EGD showed gastric erosions. Marked edema and erosions of the proximal duodenum - suspect site of bleeding in the setting of anticoagulation -GI consulted and recommends Protonix twice daily for 1 month and then daily after that -Esophageal evaluation revealed no significant findings esophagus was dilated during procedure -HGB currently above 8 -Repeat CBC every Friday starting 09/14/2021 x 3 weeks   Active problems -Esophageal Dysphagia and concerns about possible aspiration--- speech pathology eval appreciated -Had EGD on  09/03/2021--Esophageal evaluation revealed no significant findings esophagus was dilated during procedure -Speech pathology consult appreciated -Swallowing improved significantly  after EGD with dilatation -Soft Diet Advised--consider chopped meats..Patient remains at at least mild risk of Aspiration   Generalized weakness-in the setting of hypovolemia and dehydration, also concern for a component of blood loss.  May be related to anemia -Physical therapy eval appreciated recommends SNF rehab   Anemia of chronic disease now with superimposed acute blood loss anemia- -EGD and GI work-up as above #1   Thrombocytopenia -new, platelets have been normal up until this hospitalization,  -I wonder whether it is a consumptive process.   -Platelets have normalized  Osteomyelitis of Second Toe Left Foot-status post amputation last month, has been on suppressive antibiotics with plans to stop on 09/09/21 - He has healed adequately and there is no sign of infection, previous hospitalist decided to hold any  further treatment with Zyvox. 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 resulting in persistent hypocalcemia -Replaced calcium IV and orally --Discharge on oral calcium, calcitriol as well as vitamin D - -repeat chemistry with albumin and calcium levels every Friday starting on 09/14/2021   Chronic A-fib-continue metoprolol for rate control,  -Per GI service resumed Eliquis on or after 09/08/2021   Essential hypertension-hypotension has resolved, midodrine discontinued Metoprolol Xl reduced to 25 mg daily   -Pressure injury of skin- POA-stage I left buttocks and stage II medial aspect coccyx area -Wound Care:-Pressure injury 08/30/21 Buttocks Left;Right;Other (Comment) Stage 1 -  Intact skin with  non-blanchable redness of a localized area usually over a bony prominence. redness nonblanching to buttocks and gluteal cleft   Pressure Injury 08/30/21 Coccyx Upper;Medial Stage 2 -  Partial thickness loss of dermis presenting as a shallow open injury with a red, pink wound bed without slough. very small area with Pink wound bed and yellow slough   Suprapubic Catheter-chronic, discussed with urologist Dr. Alyson Ingles recommends RN change suprapubic catheter in situ--changed on 09/09/2021 -Patient  suprapubic catheter change once every month   Hyponatremia/hypokalemia/hypomagnesemia- -discharge on sodium chloride tablets --repeat chemistry with albumin and calcium levels every Friday starting on 09/14/2021  Possible pneumonia---??? aspiration related, chest x-ray from 09/08/2021 noted -Patient has penicillin allergy  .Faythe Ghee to treat with Omnicef/azithromycin  Disposition--transfer to SNF rehab  Discharge Condition: Stable  Contact information for after-discharge care     [facility] .   Service: Skilled Nursing Contact information: [address] [place] [address] [phone]                    Diet and Activity recommendation:  As advised  Discharge Instructions    Discharge Instructions     Call MD for:  difficulty breathing, headache or visual disturbances   Complete by: As directed    Call MD for:  persistant dizziness or light-headedness   Complete by: As directed    Call MD for:  persistant nausea and vomiting   Complete by: As directed    Call MD for:  redness, tenderness, or signs of infection (pain, swelling, redness, odor or green/yellow discharge around incision site)   Complete by: As directed    Call MD for:  temperature >100.4   Complete by: As directed    Diet - low sodium heart healthy   Complete by: As directed    Discharge instructions   Complete by: As directed    1)Avoid  ibuprofen/Advil/Aleve/Motrin/Goody Powders/Naproxen/BC powders/Meloxicam/Diclofenac/Indomethacin and other Nonsteroidal anti-inflammatory medications as these will make you more likely to bleed and can cause stomach ulcers, can also cause Kidney problems.   2)Repeat CBC and Renal panel (including Albumin and Calcium) every Friday starting 09/12/2021  3)Soft Diet Advised--- consider chopped meats..... Patient remains at at least mild risk of Aspiration  4)Wound Care:-Pressure injury 08/30/21 Buttocks Left;Right;Other (Comment) Stage 1 -  Intact skin with non-blanchable redness of a localized area usually over a bony prominence. redness nonblanching to buttocks and gluteal cleft   Pressure Injury 08/30/21 Coccyx Upper;Medial Stage 2 -  Partial thickness loss of dermis presenting as a shallow open injury with a red, pink wound bed without slough. very small area with Pink wound bed and yellow  slough   Discharge wound care:   Complete by: As directed    Pressure injury 08/30/21 Buttocks Left;Right;Other (Comment) Stage 1 -  Intact skin with non-blanchable redness of a localized area usually over a bony prominence. redness nonblanching to buttocks and gluteal cleft   Pressure Injury 08/30/21 Coccyx Upper;Medial Stage 2 -  Partial thickness loss of dermis presenting as a shallow open injury with a red, pink wound bed without slough. very small area with Pink wound bed and yellow slough   Increase activity slowly   Complete by: As directed          Discharge Medications     Allergies as of 09/09/2021       Reactions   Penicillins Rash   Did it involve swelling of the face/tongue/throat, SOB, or low BP? Unknown Did it involve sudden or severe rash/hives, skin peeling, or any reaction on the inside of your mouth or nose? Unknown Did you need to seek medical attention at a hospital or doctor's office? Unknown When did it last happen?       If all above answers are "NO", may proceed with  cephalosporin use.        Medication List     STOP taking these medications    amLODipine 5 MG tablet Commonly known as: NORVASC   calcium carbonate 500 MG chewable tablet Commonly known as: TUMS - dosed in mg elemental calcium       TAKE these medications    apixaban 5 MG Tabs tablet Commonly known as: ELIQUIS Take 5 mg by mouth 2 (two) times daily.   atorvastatin 40 MG tablet Commonly known as: LIPITOR Take 40 mg by mouth at bedtime.   azithromycin 500 MG tablet Commonly known as: ZITHROMAX Take 1 tablet (500 mg total) by mouth daily for 3 days.   calcitRIOL 0.5 MCG capsule Commonly known as: ROCALTROL Take 1 capsule (0.5 mcg total) by mouth daily.   calcium carbonate 1250 (500 Ca) MG tablet Commonly known as: OS-CAL - dosed in mg of elemental calcium Take 2 tablets (2,500 mg total) by mouth 2 (two) times daily with a meal.   cefdinir 300 MG capsule Commonly known as: OMNICEF Take 1 capsule (300 mg total) by mouth 2 (two) times daily for 5 days.   feeding supplement Liqd Take 237 mLs by mouth 2 (two) times daily between meals.   Lenvima (10 MG Daily Dose) capsule Generic drug: lenvatinib 10 mg daily dose Take 10 mg by mouth See admin instructions. '10mg'$  daily for 2 weeks, off for 1 week, then repeat.   levothyroxine 150 MCG tablet Commonly known as: SYNTHROID Take 1 tablet (150 mcg total) by mouth daily before breakfast. What changed:  how much to take when to take this   lisinopril 10 MG tablet Commonly known as: ZESTRIL Take 10 mg by mouth daily.   methenamine 1 g tablet Commonly known as: HIPREX Take 1 g by mouth 2 (two) times daily.   metoprolol succinate 25 MG 24 hr tablet Commonly known as: TOPROL-XL Take 1 tablet (25 mg total) by mouth daily. Start taking on: September 10, 2021 What changed:  medication strength how much to take   multivitamin tablet Take 1 tablet by mouth daily.   ondansetron 4 MG disintegrating tablet Commonly  known as: ZOFRAN-ODT Take 4 mg by mouth every 8 (eight) hours as needed.   pantoprazole 40 MG tablet Commonly known as: PROTONIX Take 1 tablet (40 mg total) by mouth 2 (two)  times daily.   psyllium 58.6 % packet Commonly known as: METAMUCIL Take 1 packet by mouth daily.   sodium chloride 1 g tablet Take 1 tablet (1 g total) by mouth daily.   vitamin C 1000 MG tablet Take 1,000 mg by mouth daily.   vitamin D3 25 MCG tablet Commonly known as: CHOLECALCIFEROL Take 1 tablet (1,000 Units total) by mouth daily. Start taking on: September 10, 2021               Discharge Care Instructions  (From admission, onward)           Start     Ordered   09/09/21 0000  Discharge wound care:       Comments: Pressure injury 08/30/21 Buttocks Left;Right;Other (Comment) Stage 1 -  Intact skin with non-blanchable redness of a localized area usually over a bony prominence. redness nonblanching to buttocks and gluteal cleft   Pressure Injury 08/30/21 Coccyx Upper;Medial Stage 2 -  Partial thickness loss of dermis presenting as a shallow open injury with a red, pink wound bed without slough. very small area with Pink wound bed and yellow slough   09/09/21 1325            Major procedures and Radiology Reports - PLEASE review detailed and final reports for all details, in brief -   DG Chest 2 View  Result Date: 09/08/2021 CLINICAL DATA:  Dyspnea and respiratory abnormalities. EXAM: CHEST - 2 VIEW COMPARISON:  AP chest 09/05/2021 and chest two views 08/31/2021 FINDINGS: There is new homogeneous opacification of the inferior 40% of the right hemithorax that appears to represent a moderate pleural effusion with associated heterogeneous airspace opacity. Small left pleural effusion is similar to prior. No pneumothorax is seen. There is again enlargement of the cardiac silhouette is silhouetted by the right pleural effusion. Moderate calcification within the aortic arch. Mediastinal contours are  unchanged. Surgical clips overlie the inferior neck and bilateral superior hemi-thoraces. Moderate multilevel degenerative disc changes of the thoracic spine. IMPRESSION: New moderate right pleural effusion and stable tiny left pleural effusion. Associated basilar lung opacities likely represent atelectasis. Recommend clinical correlation for possible pneumonia. Electronically Signed   By: Yvonne Kendall M.D.   On: 09/08/2021 08:18   DG CHEST PORT 1 VIEW  Result Date: 09/05/2021 CLINICAL DATA:  Shortness of breath EXAM: PORTABLE CHEST 1 VIEW COMPARISON:  Chest x-ray dated August 31, 2021 FINDINGS: Unchanged cardiomegaly. 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 Small layering left pleural effusion. Bibasilar atelectasis. No evidence of pneumothorax. Surgical clips noted at the neck base. IMPRESSION: Bibasilar atelectasis and small layering left pleural effusion. Electronically Signed   By: Yetta Glassman M.D.   On: 09/05/2021 12:42   DG Chest 2 View  Result Date: 08/31/2021 CLINICAL DATA:  Short of breath.  Follow-up study. EXAM: CHEST - 2 VIEW COMPARISON:  08/30/2021 and older exams. FINDINGS: Stable cardiomegaly. Persistent lung base opacities, right greater than left, consistent with small effusions and atelectasis. Remainder of the lungs is clear. No pneumothorax. IMPRESSION: 1. No significant change from the previous day's study. 2. Lung base opacities consistent with a combination of pleural effusions and atelectasis. Stable cardiomegaly. Electronically Signed   By: Lajean Manes M.D.   On: 08/31/2021 10:53   DG Chest Port 1 View  Result Date: 08/30/2021 CLINICAL DATA:  Questionable sepsis EXAM: PORTABLE CHEST 1 VIEW COMPARISON:  06/05/2018 FINDINGS: No focal consolidation. Trace bilateral pleural effusions. No pneumothorax. Stable cardiomegaly.  No acute osseous abnormality. IMPRESSION: 1. Trace bilateral pleural effusions. Electronically Signed   By: Kathreen Devoid M.D.   On: 08/30/2021 14:36    Micro Results   Recent Results (from the past 240 hour(s))  SARS Coronavirus 2 by RT PCR (hospital order, performed in [facility] hospital lab) *cepheid single result test* Anterior Nasal Swab     Status: None   Collection Time: 08/30/21  4:29 PM   Specimen: Anterior Nasal Swab  Result Value Ref Range Status   SARS Coronavirus 2 by RT PCR NEGATIVE NEGATIVE Final    Comment: (NOTE) SARS-CoV-2 target nucleic acids are NOT DETECTED.  The SARS-CoV-2 RNA is generally detectable in upper and lower respiratory specimens during the acute phase of infection. The lowest concentration of SARS-CoV-2 viral copies this assay can detect is 250 copies / mL. A negative result does not preclude SARS-CoV-2 infection and should not be used as the sole basis for treatment or other patient management decisions.  A negative result may occur with improper specimen collection / handling, submission of specimen other than nasopharyngeal swab, presence of viral mutation(s) within the areas targeted by this assay, and inadequate number of viral copies (<250 copies / mL). A negative result must be combined with clinical observations, patient history, and epidemiological information.  Fact Sheet for Patients:   https://www.patel.info/  Fact Sheet for Healthcare Providers: https://hall.com/  This test is not yet approved or  cleared by the [place] FDA and has been authorized for detection and/or diagnosis of SARS-CoV-2 by FDA under an Emergency Use Authorization (EUA).  This EUA will remain in effect (meaning this test can be used) for the duration of the COVID-19 declaration under Section 564(b)(1) of the Act, 21 U.S.C. section 360bbb-3(b)(1), unless the authorization is terminated or revoked sooner.  Performed at [facility], [address] [place], [place] [address]   MRSA Next Gen by PCR, Nasal     Status: None    Collection Time: 08/30/21  5:48 PM   Specimen: Urine, Suprapubic; Nasal Swab  Result Value Ref Range Status   MRSA by PCR Next Gen NOT DETECTED NOT DETECTED Final    Comment: (NOTE) The GeneXpert MRSA Assay (FDA approved for NASAL specimens only), is one component of a comprehensive MRSA colonization surveillance program. It is not intended to diagnose MRSA infection nor to guide or monitor treatment for MRSA infections. Test performance is not FDA approved in patients less than 15 years old. Performed at [facility], [address] [place], [place] [address]     Today   [place] today has no new complaints -Swallowing is better No fever  Or chills   No Nausea, Vomiting or Diarrhea           Patient has been seen and examined prior to discharge   Objective   Blood pressure (!) 140/87, pulse 80, temperature 98.4 F (36.9 C), resp. rate 18, height '6\' 1"'$  (1.854 m), weight 96.4 kg, SpO2 95 %.   Intake/Output Summary (Last 24 hours) at 09/09/2021 1448 Last data filed at 09/09/2021 1100 Gross per 24 hour  Intake --  Output 650 ml  Net -650 ml    Exam Gen:- Awake Alert, in no acute distress  HEENT:- [place].AT, No sclera icterus Neck-Supple Neck,No JVD,.  Lungs-improving air movement, no wheezing CV- S1, S2 normal, RRR Abd-  +ve B.Sounds, Abd Soft, No tenderness,    Extremity/Skin:- trace  edema,   good pedal pulses  Psych-affect is appropriate, oriented x3 Neuro-generalized weakness no new focal deficits, no tremors GU-suprapubic catheter in situ--changed on 09/09/2021   Data Review   CBC w Diff:  Lab Results  Component Value Date   WBC 6.0 09/09/2021   HGB 8.5 (L) 09/09/2021   HCT 25.4 (L) 09/09/2021   PLT 283 09/09/2021   LYMPHOPCT 7 09/05/2021   MONOPCT 14 09/05/2021   EOSPCT 0 09/05/2021   BASOPCT 0 09/05/2021    CMP:  Lab Results  Component Value Date   NA 132 (L) 09/09/2021   K 3.5 09/09/2021   CL 97 (L) 09/09/2021   CO2 27  09/09/2021   BUN 10 09/09/2021   CREATININE 1.04 09/09/2021   PROT 5.2 (L) 09/04/2021   ALBUMIN 2.5 (L) 09/09/2021   BILITOT 1.2 09/04/2021   ALKPHOS 43 09/04/2021   AST 22 09/04/2021   ALT 30 09/04/2021  .  Total Discharge time is about 33 minutes  Roxan Hockey M.D on 09/09/2021 at 2:48 PM  Go to [email or web address] -  for contact info  [facility] - Office  [phone]

## 2021-09-09 NOTE — Progress Notes (Signed)
Speech Language Pathology Treatment: Dysphagia  Patient Details Name: Paul Bradley MRN: [id number] DOB: 1945/06/13 Today's Date: 09/09/2021 Time: 6553-7482 SLP Time Calculation (min) (ACUTE ONLY): 21 min  Assessment / Plan / Recommendation Clinical Impression  Pt seen for ongoing dysphagia intervention s/p EGD with Pt reporting great improvement in his swallowing. Pt consumed his mechanical soft lunch meal and thin liquids without report of difficulty. Pt observed with regular textures and thin liquids and Pt presented with mild lingual coating with graham crackers and benefited from liquid wash. Pt with one delayed cough following sequential straw sips of water. He was reminded to take small sips and avoid talking when consuming foods/liquids. Pt is being discharged to SNF today and may benefit from one SLP f/u at receiving facility and continue D3/mech soft and thin.    HPI HPI: 76 year old male with history of recurrent, metastatic iodine refractory papillary thyroid carcinoma, no longer on treatment since May, suprapubic catheter, second toe osteomyelitis currently on linezolid twice daily, DM2, HTN, chronic A-fib on Eliquis comes into the hospital with weakness.  Since he has been on linezolid over the last month, he has been having poor appetite, diarrhea, and was dehydrated several times in the last month.  He has also been complaining of significant weakness.  Hospital course complicated by progressive anemia, blood in the stool, and cardiology was consulted. GI following and Pt on liquid diet. Chest x-ray 8/19-trace bilateral pleural effusions and no significant change with chest xray 8/20. BSE requested.      SLP Plan  Continue with current plan of care      Recommendations for follow up therapy are one component of a multi-disciplinary discharge planning process, led by the attending physician.  Recommendations may be updated based on patient status, additional functional criteria and  insurance authorization.    Recommendations  Diet recommendations: Dysphagia 3 (mechanical soft);Thin liquid Liquids provided via: Cup;Straw Medication Administration: Whole meds with liquid Supervision: Patient able to self feed Compensations: Small sips/bites Postural Changes and/or Swallow Maneuvers: Seated upright 90 degrees;Upright 30-60 min after meal                Oral Care Recommendations: Oral care BID Follow Up Recommendations: Follow physician's recommendations for discharge plan and follow up therapies Assistance recommended at discharge: PRN SLP Visit Diagnosis: Dysphagia, unspecified (R13.10) Plan: Continue with current plan of care         Thank you,  Genene Churn, [place]   Elliott  09/09/2021, 1:39 PM

## 2021-09-09 NOTE — Discharge Instructions (Signed)
1)Avoid ibuprofen/Advil/Aleve/Motrin/Goody Powders/Naproxen/BC powders/Meloxicam/Diclofenac/Indomethacin and other Nonsteroidal anti-inflammatory medications as these will make you more likely to bleed and can cause stomach ulcers, can also cause Kidney problems.   2)Repeat CBC and Renal panel (including Albumin and Calcium) every Friday starting 09/12/2021  3)Soft Diet Advised--- consider chopped meats..... Patient remains at at least mild risk of Aspiration  4)Wound Care:-Pressure injury 08/30/21 Buttocks Left;Right;Other (Comment) Stage 1 -  Intact skin with non-blanchable redness of a localized area usually over a bony prominence. redness nonblanching to buttocks and gluteal cleft   Pressure Injury 08/30/21 Coccyx Upper;Medial Stage 2 -  Partial thickness loss of dermis presenting as a shallow open injury with a red, pink wound bed without slough. very small area with Pink wound bed and yellow slough

## 2021-09-09 NOTE — Progress Notes (Signed)
IV removed and discharge summary printed for transport to [facility].  Report called to Jocelyn Lamer and to got to room 160

## 2021-09-09 NOTE — Care Management Important Message (Signed)
Important Message  Patient Details  Name: Paul Bradley MRN: [id number] Date of Birth: 06-30-1945   Medicare Important Message Given:  Yes     Tommy Medal 09/09/2021, 12:15 PM

## 2021-09-10 ENCOUNTER — Non-Acute Institutional Stay (SKILLED_NURSING_FACILITY): Payer: Self-pay | Admitting: Adult Health

## 2021-09-10 ENCOUNTER — Encounter: Payer: Self-pay | Admitting: Adult Health

## 2021-09-10 DIAGNOSIS — E43 Unspecified severe protein-calorie malnutrition: Secondary | ICD-10-CM

## 2021-09-10 DIAGNOSIS — R579 Shock, unspecified: Secondary | ICD-10-CM

## 2021-09-10 DIAGNOSIS — E1142 Type 2 diabetes mellitus with diabetic polyneuropathy: Secondary | ICD-10-CM | POA: Insufficient documentation

## 2021-09-10 DIAGNOSIS — E1169 Type 2 diabetes mellitus with other specified complication: Secondary | ICD-10-CM

## 2021-09-10 DIAGNOSIS — I152 Hypertension secondary to endocrine disorders: Secondary | ICD-10-CM

## 2021-09-10 DIAGNOSIS — I700 Atherosclerosis of aorta: Secondary | ICD-10-CM

## 2021-09-10 DIAGNOSIS — E871 Hypo-osmolality and hyponatremia: Secondary | ICD-10-CM

## 2021-09-10 DIAGNOSIS — C73 Malignant neoplasm of thyroid gland: Secondary | ICD-10-CM

## 2021-09-10 DIAGNOSIS — I482 Chronic atrial fibrillation, unspecified: Secondary | ICD-10-CM

## 2021-09-10 DIAGNOSIS — E785 Hyperlipidemia, unspecified: Secondary | ICD-10-CM

## 2021-09-10 DIAGNOSIS — N390 Urinary tract infection, site not specified: Secondary | ICD-10-CM

## 2021-09-10 DIAGNOSIS — J189 Pneumonia, unspecified organism: Secondary | ICD-10-CM

## 2021-09-10 DIAGNOSIS — K264 Chronic or unspecified duodenal ulcer with hemorrhage: Secondary | ICD-10-CM

## 2021-09-10 DIAGNOSIS — D696 Thrombocytopenia, unspecified: Secondary | ICD-10-CM | POA: Insufficient documentation

## 2021-09-10 DIAGNOSIS — E890 Postprocedural hypothyroidism: Secondary | ICD-10-CM

## 2021-09-10 DIAGNOSIS — D62 Acute posthemorrhagic anemia: Secondary | ICD-10-CM

## 2021-09-10 DIAGNOSIS — E1159 Type 2 diabetes mellitus with other circulatory complications: Secondary | ICD-10-CM

## 2021-09-10 DIAGNOSIS — M869 Osteomyelitis, unspecified: Secondary | ICD-10-CM

## 2021-09-10 NOTE — Progress Notes (Signed)
Location:  [facility] Room Number: 160 Place of Service:  SNF (31)   CODE STATUS: dnr   Allergies  Allergen Reactions   Penicillins Rash    Did it involve swelling of the face/tongue/throat, SOB, or low BP? Unknown Did it involve sudden or severe rash/hives, skin peeling, or any reaction on the inside of your mouth or nose? Unknown Did you need to seek medical attention at a hospital or doctor's office? Unknown When did it last happen?       If all above answers are "NO", may proceed with cephalosporin use.     Chief Complaint  Patient presents with   Hospitalization Follow-up    HPI:  He is a 76 year old man who has been hospitalized from 08-30-21 through 09-09-21. His medical history includes: thyroid cancer discontinuation of treatment in May 2023. Osteomyelitis: s/p post amputation left great toe and second toe; type 2 diabetes; chronic atrial fibrillation on eliquis. He presented to the ED after 3 days after being discharged from [facility] with concerns for weakness. His biggest issue at the time of admission was osteomyelitis of his left second toe. He had been on linezolid and since then has had terrible stomach problems. He has had a poor appetite with very poor po intake. He has had issues with dehydration with his medication. His blood pressure was very low requiring fluid boluses and trendelenburg.  His hemoccult was positive hgb 8.4 which represented a significant drop. February was 12. Sodium was very low at 122. He did receive 1 unit packed red blood cells.  GI bleeding: he had worsening anemia and fecal occult was positive. There are no clear cut overt GI bleeding. His hgb dropped over the past 6 months. GI consulted. EGD/colonoscopy on 09-06-21: colonoscopy unremarkable. Marked edema and erosions of proximal duodenum. GI recommends protonix 2 times daily for one month then one time daily. Hgb is currently above 8.   Esophageal dysphagia: concerns  about possible aspiration: speech evaluation appreciated EGD: esophagus was dilated.  Generalized weakness: with hypovolemia; dehydration; blood loss; anemia will need SNF Hypocalcemia: chronic history of thyroidectomy for thyroid cancer. 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 Has been replaced will need replacement therapies.   He does have a cough with sputum. Denies any excessive fatigue. He will continue to be followed for his chronic illnesses including: Thyroid cancer/postsurgical hypothyroidism: . Chronic afib on eliquis: . Hypocalcemia:  Hyponatremia; Thrombocytopenia:    Past Medical History:  Diagnosis Date   Abscess of left hip    "healing wound" per [facility] Heme/Onc MD note 06/03/18   Assistance needed for mobility    walker   Atrial fibrillation ([place])    BPH (benign prostatic hyperplasia)    Cancer (HCC)    thyroid   Chronic anticoagulation    Coronary artery disease    Diabetes mellitus without complication ([place])    Hematochezia 05/05/2018   colonoscopy at [facility] with diverticulosis    Hypertension    Thyroid disease     Past Surgical History:  Procedure Laterality Date   THYROIDECTOMY      Social History   Socioeconomic History   Marital status: Single    Spouse name: Not on file   Number of children: Not on file   Years of education: Not on file   Highest education level: Not on file  Occupational History   Not on file  Tobacco Use   Smoking status: Never   Smokeless  tobacco: Never  Vaping Use   Vaping Use: Never used  Substance and Sexual Activity   Alcohol use: Never   Drug use: Never   Sexual activity: Not on file  Other Topics Concern   Not on file  Social History Narrative   [place]. NO KIDS. WORKED AT [employer] TIME UNTIL LAST [place] 2020.   Social Determinants of Health   Financial Resource Strain: Not on file  Food Insecurity: Not on file  Transportation Needs:  Not on file  Physical Activity: Not on file  Stress: Not on file  Social Connections: Not on file  Intimate Partner Violence: Not on file   Family History  Problem Relation Age of Onset   Prostate cancer Father    Colon cancer Neg Hx    Colon polyps Neg Hx       VITAL SIGNS BP 130/86   Pulse 88   Temp 97.8 F (36.6 C)   Resp 20   Ht '6\' 1"'$  (1.854 m)   Wt 214 lb 4.8 oz (97.2 kg)   SpO2 94%   BMI 28.27 kg/m   Outpatient Encounter Medications as of 09/10/2021  Medication Sig   apixaban (ELIQUIS) 5 MG TABS tablet Take 5 mg by mouth 2 (two) times daily.   Ascorbic Acid (VITAMIN C) 1000 MG tablet Take 1,000 mg by mouth daily.   atorvastatin (LIPITOR) 40 MG tablet Take 40 mg by mouth at bedtime.   azithromycin (ZITHROMAX) 500 MG tablet Take 1 tablet (500 mg total) by mouth daily for 3 days.   calcitRIOL (ROCALTROL) 0.5 MCG capsule Take 1 capsule (0.5 mcg total) by mouth daily.   calcium carbonate (OS-CAL - DOSED IN MG OF ELEMENTAL CALCIUM) 1250 (500 Ca) MG tablet Take 2 tablets (2,500 mg total) by mouth 2 (two) times daily with a meal.   cefdinir (OMNICEF) 300 MG capsule Take 1 capsule (300 mg total) by mouth 2 (two) times daily for 5 days.   cholecalciferol (CHOLECALCIFEROL) 25 MCG tablet Take 1 tablet (1,000 Units total) by mouth daily.   feeding supplement (ENSURE ENLIVE / ENSURE PLUS) LIQD Take 237 mLs by mouth 2 (two) times daily between meals.   lenvatinib 10 mg daily dose (LENVIMA, 10 MG DAILY DOSE,) capsule Take 10 mg by mouth See admin instructions. '10mg'$  daily for 2 weeks, off for 1 week, then repeat. (Patient not taking: Reported on 08/31/2021)   levothyroxine (SYNTHROID) 150 MCG tablet Take 1 tablet (150 mcg total) by mouth daily before breakfast.   lisinopril (ZESTRIL) 10 MG tablet Take 10 mg by mouth daily.   methenamine (HIPREX) 1 g tablet Take 1 g by mouth 2 (two) times daily.   metoprolol succinate (TOPROL-XL) 25 MG 24 hr tablet Take 1 tablet (25 mg total) by mouth  daily.   Multiple Vitamin (MULTIVITAMIN) tablet Take 1 tablet by mouth daily.   ondansetron (ZOFRAN-ODT) 4 MG disintegrating tablet Take 4 mg by mouth every 8 (eight) hours as needed.   pantoprazole (PROTONIX) 40 MG tablet Take 1 tablet (40 mg total) by mouth 2 (two) times daily.   psyllium (METAMUCIL) 58.6 % packet Take 1 packet by mouth daily.   sodium chloride 1 g tablet Take 1 tablet (1 g total) by mouth daily.   No facility-administered encounter medications on file as of 09/10/2021.     SIGNIFICANT DIAGNOSTIC EXAMS  TODAY  08-30-21: chest x-ray: 1. Trace bilateral pleural effusions.   09-08-21: chest x-ray:  New moderate  right pleural effusion and stable tiny left pleural effusion. Associated basilar lung opacities likely represent atelectasis. Recommend clinical correlation for possible pneumonia.  TODAY  08-30-21; wbc 7.1; hgb 8.7; hct 25.4; mcv 91.0 plt 105; glucose 141; bun 21; creat 1.22; k+ 4.1; na++ 122; ca 6.0; gfr >60; protein 5.6; albumin 2.8; blood culture: no growth: urine culture: klebsiella oxytoca; 80,000 pseudomonas aeruginosa 08-31-21: wbc 5.7; hgb 9.0; hct 26.1; mcv 90.9 plt 103; glucose 115; bun 22; creat 1.17; k+ 4.6; na++ 128; ca 5.4; gfr >60; protein 5.3; albumin 2.7 hgb a1c 6.1 09-02-21: wbc 5.1; hgb 8.0; hct 22.8; mcv 91.2 plt 102; glucose 92; bun 17; creat 1.17; k+ 3.6; na++ 128; ca 5.4; gfr >60 09-03-21: glucose 112; bun 10; creat 1.06; k+ 3.7; na++ 130; ca  5.8 gfr >60; protein 5.7 albumin 2.5 09-05-21: wbc 9.6; hgb 8.6; hct 25.2; mcv 92.3 plt 161; glucose 112; bun 7; creat 1.00; k+ 3.4; na++ 129; ca 6.3 gfr >60 mag 1.4 09-07-21:wbc 5.5; hgb 8.5; hct 25.5; mcv 95.1 plt 215l glucose 91; bun 7 creat 1.04 ;k+ 4.0; na++ 134; ca 6.3; gfr >60; phos 4.5; albumin 2.6; mag 1.7 09-09-21: wbc 6.0; hgb 8.5; hct 25.4; mcv 94.1 plt 283; glucose 108; bun 10; creat 1.04; k+ 3.5; na++ 132; ca 6.1; gfr >60; phos 4.6; albumin 2.5   Review of Systems  Constitutional:  Negative for  malaise/fatigue.  Respiratory:  Positive for cough and sputum production. Negative for shortness of breath.   Cardiovascular:  Negative for chest pain, palpitations and leg swelling.  Gastrointestinal:  Negative for abdominal pain, constipation and heartburn.  Musculoskeletal:  Negative for back pain, joint pain and myalgias.  Skin:        Bruising right arm   Neurological:  Negative for dizziness.       Has peripheral neuropathy   Psychiatric/Behavioral:  The patient is not nervous/anxious.      Physical Exam Constitutional:      General: He is not in acute distress.    Appearance: He is well-developed. He is not diaphoretic.  Neck:     Comments: S/p thyroidectomy  Cardiovascular:     Rate and Rhythm: Normal rate and regular rhythm.     Pulses: Normal pulses.     Heart sounds: Normal heart sounds.  Pulmonary:     Effort: Pulmonary effort is normal. No respiratory distress.     Breath sounds: Rhonchi present.     Comments: Few scattered rhonchi Abdominal:     General: Bowel sounds are normal. There is no distension.     Palpations: Abdomen is soft.     Tenderness: There is no abdominal tenderness.  Genitourinary:    Comments: Suprapubic catheter  Musculoskeletal:        General: Normal range of motion.     Cervical back: Neck supple.     Right lower leg: Edema present.     Left lower leg: Edema present.     Comments: 2-3 bilateral lower extremity edema History of left great toe and second toe amputation  Lymphadenopathy:     Cervical: No cervical adenopathy.  Skin:    General: Skin is warm and dry.     Comments: Significant bruising right upper extremity with hematoma present   Neurological:     Mental Status: He is alert and oriented to person, place, and time.  Psychiatric:        Mood and Affect: Mood normal.      ASSESSMENT/ PLAN:  TODAY  Gastrointestinal hemorrhage associated with duodenal ulcer/ vascular shock / acute blood loss anemia (ABLA) he is status  post transfusion; EGD; and colonoscopy:  will continue protonix 40 mg twice daily for one month then long term daily  2. Thyroid cancer/postsurgical hypothyroidism: is presently off lenvatinib until seen by oncology. Will continue synthroid 150 mcg daily   3. Chronic afib on eliquis: heart rate is stable will continue toprol xl 25 mg daily for rate control eliquis 5 mg twice daily   4. Hypocalcemia: ca++ 6.1 is taking: calcitriol 05 mcg daily; oscal 2 tabs twice daily. More than likely this is due to parathyroid resection during thyroidectomy.   5. Hyponatremia; na++ 132; is on NACL 1 gm daily   6. Thrombocytopenia: plt 161; was 103 during his hospitalization  7. Hypertension associated with type 2 diabetes mellitus: b/p 130/86 will continue lisinopril 10 mg daily and toprol xl 25 mg daily   8. 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: is status post amputation; is off  linezolid.   9. Community acquired pneumonia unspecified laterality: is taking: zithromax 500 mg daily for 3 days and omnicef 300 mg twice daily for 5 days.   10. Hyperlipidemia associated with type 2 diabetes mellitus: will continue lipitor 40 mg daily   11. 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 Protein calorie malnutrition severe: 2.5 will continue ensure twice daily and will begin prostat 30 mL four times daily   13. Diabetic peripheral neuropathy associated with type 2 diabetes mellitus: is status post amputation of 2 toes due to osteomyelitis  14. Aortic atherosclerosis: (06-05-18 ct) is on statin  15. Type 2 diabetes mellitus with polyneuropathy: hgb a1c 6.1 is on statin and eliquis    Will check weekly for 3 week; bmp; albumin; cbc.   Ok Edwards NP [facility]   call [phone]

## 2021-09-11 ENCOUNTER — Encounter (HOSPITAL_COMMUNITY)
Admission: RE | Admit: 2021-09-11 | Discharge: 2021-09-11 | Disposition: A | Discharge status: Home / Self Care | Payer: Medicare HMO | Source: Skilled Nursing Facility | Attending: Adult Health | Admitting: Adult Health

## 2021-09-11 ENCOUNTER — Encounter: Payer: Self-pay | Admitting: Internal Medicine

## 2021-09-11 ENCOUNTER — Encounter: Payer: Self-pay | Admitting: Adult Health

## 2021-09-11 ENCOUNTER — Non-Acute Institutional Stay (SKILLED_NURSING_FACILITY): Payer: Medicare HMO | Admitting: Internal Medicine

## 2021-09-11 ENCOUNTER — Non-Acute Institutional Stay: Payer: Self-pay | Admitting: Adult Health

## 2021-09-11 DIAGNOSIS — E890 Postprocedural hypothyroidism: Secondary | ICD-10-CM | POA: Diagnosis not present

## 2021-09-11 DIAGNOSIS — E871 Hypo-osmolality and hyponatremia: Secondary | ICD-10-CM

## 2021-09-11 DIAGNOSIS — D62 Acute posthemorrhagic anemia: Secondary | ICD-10-CM | POA: Diagnosis not present

## 2021-09-11 DIAGNOSIS — R600 Localized edema: Secondary | ICD-10-CM

## 2021-09-11 DIAGNOSIS — E43 Unspecified severe protein-calorie malnutrition: Secondary | ICD-10-CM

## 2021-09-11 DIAGNOSIS — K264 Chronic or unspecified duodenal ulcer with hemorrhage: Secondary | ICD-10-CM

## 2021-09-11 DIAGNOSIS — M869 Osteomyelitis, unspecified: Secondary | ICD-10-CM

## 2021-09-11 DIAGNOSIS — I959 Hypotension, unspecified: Secondary | ICD-10-CM | POA: Diagnosis not present

## 2021-09-11 LAB — CBC
HCT: 26.8 % — ABNORMAL LOW (ref 39.0–52.0)
Hemoglobin: 8.6 g/dL — ABNORMAL LOW (ref 13.0–17.0)
MCH: 30.6 pg (ref 26.0–34.0)
MCHC: 32.1 g/dL (ref 30.0–36.0)
MCV: 95.4 fL (ref 80.0–100.0)
Platelets: 354 10*3/uL (ref 150–400)
RBC: 2.81 MIL/uL — ABNORMAL LOW (ref 4.22–5.81)
RDW: 16.5 % — ABNORMAL HIGH (ref 11.5–15.5)
WBC: 5.4 10*3/uL (ref 4.0–10.5)
nRBC: 0 % (ref 0.0–0.2)

## 2021-09-11 LAB — PHOSPHORUS: Phosphorus: 4.8 mg/dL — ABNORMAL HIGH (ref 2.5–4.6)

## 2021-09-11 LAB — MAGNESIUM: Magnesium: 1.5 mg/dL — ABNORMAL LOW (ref 1.7–2.4)

## 2021-09-11 LAB — BASIC METABOLIC PANEL
Anion gap: 8 (ref 5–15)
BUN: 15 mg/dL (ref 8–23)
CO2: 30 mmol/L (ref 22–32)
Calcium: 6.2 mg/dL — CL (ref 8.9–10.3)
Chloride: 95 mmol/L — ABNORMAL LOW (ref 98–111)
Creatinine, Ser: 1.04 mg/dL (ref 0.61–1.24)
GFR, Estimated: >60 mL/min (ref >60)
Glucose, Bld: 99 mg/dL (ref 70–99)
Potassium: 3.8 mmol/L (ref 3.5–5.1)
Sodium: 133 mmol/L — ABNORMAL LOW (ref 135–145)

## 2021-09-11 NOTE — Assessment & Plan Note (Signed)
Current total protein 5.2, albumin 2.5 Limb wasting cannot be documented on physical due to edematous state. Severe protein caloric malnutrition Nutrition consult at [facility]

## 2021-09-11 NOTE — Progress Notes (Signed)
Location:  [facility] Room Number: 160 Place of Service:  SNF (31)   CODE STATUS: dnr   Allergies  Allergen Reactions   Penicillins Rash    Did it involve swelling of the face/tongue/throat, SOB, or low BP? Unknown Did it involve sudden or severe rash/hives, skin peeling, or any reaction on the inside of your mouth or nose? Unknown Did you need to seek medical attention at a hospital or doctor's office? Unknown When did it last happen?       If all above answers are "NO", may proceed with cephalosporin use.     Chief Complaint  Patient presents with   Acute Visit    Lab work follow up     HPI:  He has multiple lab disturbances; calcium; magnesium; hgb; sodium; phosphorus. Her albumin is low as well. He has gained 5 pounds in the past 24 hours. He is coughing; has rhonchi present. No fevers present.   Past Medical History:  Diagnosis Date   Abscess of left hip    "healing wound" per [facility] Heme/Onc MD note 06/03/18   Assistance needed for mobility    walker   Atrial fibrillation ([place])    BPH (benign prostatic hyperplasia)    Cancer (HCC)    thyroid   Chronic anticoagulation    Coronary artery disease    Diabetes mellitus without complication ([place])    Hematochezia 05/05/2018   colonoscopy at [facility] with diverticulosis    Hypertension    Thyroid disease     Past Surgical History:  Procedure Laterality Date   THYROIDECTOMY      Social History   Socioeconomic History   Marital status: Single    Spouse name: Not on file   Number of children: Not on file   Years of education: Not on file   Highest education level: Not on file  Occupational History   Not on file  Tobacco Use   Smoking status: Never   Smokeless tobacco: Never  Vaping Use   Vaping Use: Never used  Substance and Sexual Activity   Alcohol use: Never   Drug use: Never   Sexual activity: Not on file  Other Topics Concern   Not on file  Social History Narrative    [place]. NO KIDS. WORKED AT [employer] TIME UNTIL LAST [place] 2020.   Social Determinants of Health   Financial Resource Strain: Not on file  Food Insecurity: Not on file  Transportation Needs: Not on file  Physical Activity: Not on file  Stress: Not on file  Social Connections: Not on file  Intimate Partner Violence: Not on file   Family History  Problem Relation Age of Onset   Prostate cancer Father    Colon cancer Neg Hx    Colon polyps Neg Hx       VITAL SIGNS BP 135/78   Pulse 86   Temp (!) 97.3 F (36.3 C)   Resp 20   Ht '6\' 1"'$  (1.854 m)   Wt 219 lb 8 oz (99.6 kg)   SpO2 93%   BMI 28.96 kg/m   Outpatient Encounter Medications as of 09/11/2021  Medication Sig   apixaban (ELIQUIS) 5 MG TABS tablet Take 5 mg by mouth 2 (two) times daily.   Ascorbic Acid (VITAMIN C) 1000 MG tablet Take 1,000 mg by mouth daily.   atorvastatin (LIPITOR) 40 MG tablet Take 40 mg by mouth at bedtime.   azithromycin (ZITHROMAX)  500 MG tablet Take 1 tablet (500 mg total) by mouth daily for 3 days.   calcitRIOL (ROCALTROL) 0.5 MCG capsule Take 1 capsule (0.5 mcg total) by mouth daily.   calcium carbonate (OS-CAL - DOSED IN MG OF ELEMENTAL CALCIUM) 1250 (500 Ca) MG tablet Take 2 tablets (2,500 mg total) by mouth 2 (two) times daily with a meal.   cefdinir (OMNICEF) 300 MG capsule Take 1 capsule (300 mg total) by mouth 2 (two) times daily for 5 days.   cholecalciferol (CHOLECALCIFEROL) 25 MCG tablet Take 1 tablet (1,000 Units total) by mouth daily.   feeding supplement (ENSURE ENLIVE / ENSURE PLUS) LIQD Take 237 mLs by mouth 2 (two) times daily between meals.   lenvatinib 10 mg daily dose (LENVIMA, 10 MG DAILY DOSE,) capsule Take 10 mg by mouth See admin instructions. '10mg'$  daily for 2 weeks, off for 1 week, then repeat. (Patient not taking: Reported on 08/31/2021)   levothyroxine (SYNTHROID) 150 MCG tablet Take 1 tablet (150 mcg total) by mouth daily before breakfast.    lisinopril (ZESTRIL) 10 MG tablet Take 10 mg by mouth daily.   methenamine (HIPREX) 1 g tablet Take 1 g by mouth 2 (two) times daily.   metoprolol succinate (TOPROL-XL) 25 MG 24 hr tablet Take 1 tablet (25 mg total) by mouth daily.   Multiple Vitamin (MULTIVITAMIN) tablet Take 1 tablet by mouth daily.   ondansetron (ZOFRAN-ODT) 4 MG disintegrating tablet Take 4 mg by mouth every 8 (eight) hours as needed.   pantoprazole (PROTONIX) 40 MG tablet Take 1 tablet (40 mg total) by mouth 2 (two) times daily.   psyllium (METAMUCIL) 58.6 % packet Take 1 packet by mouth daily.   sodium chloride 1 g tablet Take 1 tablet (1 g total) by mouth daily.   No facility-administered encounter medications on file as of 09/11/2021.     SIGNIFICANT DIAGNOSTIC EXAMS  PREVIOUS   08-30-21: chest x-ray: 1. Trace bilateral pleural effusions.   09-08-21: chest x-ray:  New moderate right pleural effusion and stable tiny left pleural effusion. Associated basilar lung opacities likely represent atelectasis. Recommend clinical correlation for possible pneumonia.  NO NEW LABS.   LABS REVIEWED PREVIOUS   08-30-21; wbc 7.1; hgb 8.7; hct 25.4; mcv 91.0 plt 105; glucose 141; bun 21; creat 1.22; k+ 4.1; na++ 122; ca 6.0; gfr >60; protein 5.6; albumin 2.8; blood culture: no growth: urine culture: klebsiella oxytoca; 80,000 pseudomonas aeruginosa 08-31-21: wbc 5.7; hgb 9.0; hct 26.1; mcv 90.9 plt 103; glucose 115; bun 22; creat 1.17; k+ 4.6; na++ 128; ca 5.4; gfr >60; protein 5.3; albumin 2.7 hgb a1c 6.1 09-02-21: wbc 5.1; hgb 8.0; hct 22.8; mcv 91.2 plt 102; glucose 92; bun 17; creat 1.17; k+ 3.6; na++ 128; ca 5.4; gfr >60 09-03-21: glucose 112; bun 10; creat 1.06; k+ 3.7; na++ 130; ca  5.8 gfr >60; protein 5.7 albumin 2.5 09-05-21: wbc 9.6; hgb 8.6; hct 25.2; mcv 92.3 plt 161; glucose 112; bun 7; creat 1.00; k+ 3.4; na++ 129; ca 6.3 gfr >60 mag 1.4 09-07-21:wbc 5.5; hgb 8.5; hct 25.5; mcv 95.1 plt 215l glucose 91; bun 7 creat 1.04  ;k+ 4.0; na++ 134; ca 6.3; gfr >60; phos 4.5; albumin 2.6; mag 1.7 09-09-21: wbc 6.0; hgb 8.5; hct 25.4; mcv 94.1 plt 283; glucose 108; bun 10; creat 1.04; k+ 3.5; na++ 132; ca 6.1; gfr >60; phos 4.6; albumin 2.5   TODAY  Wbc 5.4; hgb 8.6; hct 26.8;mcv 95.4 plt 354; glucose 99; bun 15; creat 1.04;  k+ 3.8; na++ 133; ca 6.2 gfr >60; mag 1.5; phos 4.8    Review of Systems  Constitutional:  Negative for malaise/fatigue.  Respiratory:  Negative for cough and shortness of breath.   Cardiovascular:  Negative for chest pain, palpitations and leg swelling.  Gastrointestinal:  Negative for abdominal pain, constipation and heartburn.  Musculoskeletal:  Negative for back pain, joint pain and myalgias.  Skin: Negative.   Neurological:  Negative for dizziness.  Psychiatric/Behavioral:  The patient is not nervous/anxious.    Physical Exam Constitutional:      General: He is not in acute distress.    Appearance: He is well-developed. He is not diaphoretic.  Neck:     Comments: S/p thyroidectomy  Cardiovascular:     Rate and Rhythm: Normal rate and regular rhythm.     Pulses: Normal pulses.     Heart sounds: Normal heart sounds.  Pulmonary:     Effort: Pulmonary effort is normal. No respiratory distress.     Breath sounds: Rhonchi present.  Abdominal:     General: Bowel sounds are normal. There is no distension.     Palpations: Abdomen is soft.     Tenderness: There is no abdominal tenderness.  Genitourinary:    Comments:  Suprapubic catheter  Musculoskeletal:     Cervical back: Neck supple.     Right lower leg: No edema.     Left lower leg: No edema.     Comments: 2-3 bilateral lower extremity edema History of left great toe and second toe amputation   Lymphadenopathy:     Cervical: No cervical adenopathy.  Skin:    General: Skin is warm and dry.     Comments: Bruising right arm   Neurological:     Mental Status: He is alert. Mental status is at baseline.  Psychiatric:        Mood  and Affect: Mood normal.       ASSESSMENT/ PLAN:  TODAY  Hypocalcemia ABLA (acute blood loss anemia) Hyponatremia Hypomagnesemia Protein calorie malnutrition severe Bilateral lower extremity edema    Will check: CMP; vitamin D; pth; tsh free t4;  mag level 09-15-21  Will begin mag ox 400 mg twice daily  Will begin aldactone 25 mg daily for 7 days.    Ok Edwards NP [facility]   call [phone]

## 2021-09-11 NOTE — Assessment & Plan Note (Addendum)
Clinically resolved;Wound Care Nurse to monitor at SNF.

## 2021-09-11 NOTE — Progress Notes (Signed)
NURSING HOME LOCATION:  [facility] ROOM NUMBER:  [id number]  CODE STATUS:  DNR  PCP: Burman Riis MD  This is a comprehensive admission note to this SNFperformed on this date less than 30 days from date of admission. Included are preadmission medical/surgical history; reconciled medication list; family history; social history and comprehensive review of systems.  Corrections and additions to the records were documented. Comprehensive physical exam was also performed. Additionally a clinical summary was entered for each active diagnosis pertinent to this admission in the Problem List to enhance continuity of care.  HPI: He was hospitalized 8/19 - 09/09/2021 presenting with severe weakness in the context of recent hospitalization for osteomyelitis of second left toe for which he was on linezolid 600 mg twice daily following amputation of the second toe 7/23 at [facility]. Remotely he had amputation of first great toe. After starting that medication he had severe GI intolerance with severe anorexia with decreased oral intake.  Hypotension with systolics in the 32D necessitated several NS boli IV fluids and placement in the Trendelenburg position. Tachycardia was present with heart rates in the 140s. FOB was positive; marked anemia was present with globin of 8.4.  Anemia has progressed since hemoglobin was 12.0 in February of this year. EGD was completed 8/23 and revealed esophageal stricture, gastric and duodenal erosions with duodenal edema.  Esophageal dilation was completed.  Colonoscopy 8/26 was essentially unremarkable.  Protonix twice daily for 1 month with subsequent once daily dose was recommended. Clinically esophageal dysphagia has been present; this improved following the dilation.  Speech therapy recommended soft diet with chopped meats as he was felt to have at least a mild risk of aspiration despite the dilation. Profound hypocalcemia was documented with a nadir calcium  of 5.3.  Clinically it was suspect that at thyroidectomy he had also had his parathyroid glands resected.  He also had significant hyponatremia with a value 122 and hypomagnesemia at 1.4.  With repletion there were improvement in the values.  Final calcium was 6.1 and sodium 132.  Final H/H was 8.5/25.4. He also exhibited thrombocytopenia with a nadir platelet count of 123,000; prior to discharge this had normalized with a value of 283,000. In the context of history of thyroidectomy TSH was 0.201 on 150 mcg of L-thyroxine. He was felt to be stable enough to be discharged to SNF for rehab with PT/OT.  Past medical and surgical history includes atrial fibrillation, BPH, history of thyroid cancer, CAD, essential hypertension, and diabetes with peripheral neuropathy. Surgeries include thyroidectomy and suprapubic catheter placement.  Social history:non smoker, non drinker  Family history: limited history reviewed   Review of systems: He was cognizant of the profound hypotension stating "blood pressure down to 70."  He was under the impression that the "high powered antibiotic had pulled it down."  He validated that "my esophagus needed to be stretched.".  He is describing loose to watery stools PTA.  He stated he was unable to eat because of nausea and vomiting.  He was unsure of any other EGD findings beyond the stricture.  He denies any active GI symptoms at this time.  He does state that he has had intermittent coughing after ingesting fluids.  Any sputum is clear.  He states that he has been told he snores but there has been no apnea reported.  He describes intermittent minor epistaxis which has resolved.  Surprisingly he denies any joint pain.  He has peripheral edema but no paroxysmal nocturnal dyspnea.  He does describe neuropathy in his feet with numbness.  Constitutional: No fever, significant weight change Eyes: No redness, discharge, pain, vision change ENT/mouth: No nasal congestion, purulent  discharge, earache, change in hearing, sore throat  Cardiovascular: No chest pain, palpitations, paroxysmal nocturnal dyspnea, claudication, edema  Respiratory: No  hemoptysis, DOE Gastrointestinal: No heartburn, dysphagia, abdominal pain, nausea /vomiting, rectal bleeding, melena @ this time Genitourinary: No hematuria, pyuria Musculoskeletal: No joint stiffness, joint swelling, pain Dermatologic: No rash, pruritus, change in appearance of skin Neurologic: No dizziness, headache, syncope, seizures Psychiatric: No significant anxiety, depression, insomnia, anorexia Endocrine: No change in hair/skin/nails, excessive thirst, excessive hunger, excessive urination  Hematologic/lymphatic: No significant lymphadenopathy, significant abnormal bleeding Allergy/immunology: No itchy/watery eyes, significant sneezing, urticaria, angioedema  Physical exam:  Pertinent or positive findings: Eyebrows are decreased in density.  He has a beard and mustache.  Severe caries are present with erosions of many maxillary teeth to the gumline and below.  First and second heart sounds are accentuated.  He exhibits an intermittent slightly loose cough which is nonproductive.  He has bilateral expiratory rhonchi.  Abdomen is protuberant.  Suprapubic catheter is present.  Tense edema is present 1+ on the right lower extremity and 3/4+ in the left lower extremity.  Dorsalis pedis pulses are strong; posterior tibial pulses are reduced. L 1st & 2nd toes surgically absent. There is linear ecchymoses over the right medial ankle.  There is faint hyperpigmentation over the left ankle medially.  General appearance: Adequately nourished; no acute distress, increased work of breathing is present.   Lymphatic: No lymphadenopathy about the head, neck, axilla. Eyes: No conjunctival inflammation or lid edema is present. There is no scleral icterus. Ears:  External ear exam shows no significant lesions or deformities.   Nose:  External  nasal examination shows no deformity or inflammation. Nasal mucosa are pink and moist without lesions, exudates Neck:  No thyromegaly, masses, tenderness noted.    Heart:  Normal rate and regular rhythm without gallop, murmur, click, rub.  Lungs:  without wheezes, rales, rubs. Abdomen: Bowel sounds are normal.  Abdomen is soft and nontender with no organomegaly, hernias, masses. GU: Deferred  Extremities:  No cyanosis, clubbing Neurologic exam: Balance, Rhomberg, finger to nose testing could not be completed due to clinical state Skin: Warm & dry w/o tenting. No significant rash.  See clinical summary under each active problem in the Problem List with associated updated therapeutic plan

## 2021-09-11 NOTE — Assessment & Plan Note (Addendum)
Current calcium 6.1; calcium and vitamin D supplementation will be continued.  Calcium, vitamin D level, and PTH levels will be checked.

## 2021-09-11 NOTE — Assessment & Plan Note (Addendum)
Hypotension resolved & BP controlled; no change in antihypertensive medications

## 2021-09-11 NOTE — Assessment & Plan Note (Addendum)
TSH 0.201 on 150 mcg of L-thyroxine. Plan possibly to suppress TSH in view of the history of thyroid cancer.

## 2021-09-11 NOTE — Patient Instructions (Signed)
See assessment and plan under each diagnosis in the problem list and acutely for this visit 

## 2021-09-11 NOTE — Assessment & Plan Note (Addendum)
Protonix will be continued twice daily x1 month and then once daily thereafter as maintenance.

## 2021-09-12 DIAGNOSIS — R600 Localized edema: Secondary | ICD-10-CM | POA: Insufficient documentation

## 2021-09-15 ENCOUNTER — Encounter (HOSPITAL_COMMUNITY)
Admission: RE | Admit: 2021-09-15 | Discharge: 2021-09-15 | Disposition: A | Discharge status: Home / Self Care | Payer: Medicare HMO | Source: Skilled Nursing Facility | Attending: Adult Health | Admitting: Adult Health

## 2021-09-15 DIAGNOSIS — E871 Hypo-osmolality and hyponatremia: Secondary | ICD-10-CM | POA: Insufficient documentation

## 2021-09-15 LAB — CBC
HCT: 27.9 % — ABNORMAL LOW (ref 39.0–52.0)
Hemoglobin: 9 g/dL — ABNORMAL LOW (ref 13.0–17.0)
MCH: 31.1 pg (ref 26.0–34.0)
MCHC: 32.3 g/dL (ref 30.0–36.0)
MCV: 96.5 fL (ref 80.0–100.0)
Platelets: 431 10*3/uL — ABNORMAL HIGH (ref 150–400)
RBC: 2.89 MIL/uL — ABNORMAL LOW (ref 4.22–5.81)
RDW: 17.6 % — ABNORMAL HIGH (ref 11.5–15.5)
WBC: 5.9 10*3/uL (ref 4.0–10.5)
nRBC: 0 % (ref 0.0–0.2)

## 2021-09-15 LAB — BASIC METABOLIC PANEL
Anion gap: 10 (ref 5–15)
BUN: 18 mg/dL (ref 8–23)
CO2: 29 mmol/L (ref 22–32)
Calcium: 7 mg/dL — ABNORMAL LOW (ref 8.9–10.3)
Chloride: 96 mmol/L — ABNORMAL LOW (ref 98–111)
Creatinine, Ser: 1.02 mg/dL (ref 0.61–1.24)
GFR, Estimated: >60 mL/min (ref >60)
Glucose, Bld: 102 mg/dL — ABNORMAL HIGH (ref 70–99)
Potassium: 3.9 mmol/L (ref 3.5–5.1)
Sodium: 135 mmol/L (ref 135–145)

## 2021-09-15 LAB — MAGNESIUM: Magnesium: 1.8 mg/dL (ref 1.7–2.4)

## 2021-09-15 LAB — T4, FREE: Free T4: 0.73 ng/dL (ref 0.61–1.12)

## 2021-09-15 LAB — VITAMIN D 25 HYDROXY (VIT D DEFICIENCY, FRACTURES): Vit D, 25-Hydroxy: 42.28 ng/mL (ref 30–100)

## 2021-09-15 LAB — TSH: TSH: 13.426 u[IU]/mL — ABNORMAL HIGH (ref 0.350–4.500)

## 2021-09-16 ENCOUNTER — Non-Acute Institutional Stay (SKILLED_NURSING_FACILITY): Payer: Medicare HMO | Admitting: Adult Health

## 2021-09-16 ENCOUNTER — Encounter: Payer: Self-pay | Admitting: Adult Health

## 2021-09-16 DIAGNOSIS — D62 Acute posthemorrhagic anemia: Secondary | ICD-10-CM

## 2021-09-16 DIAGNOSIS — E890 Postprocedural hypothyroidism: Secondary | ICD-10-CM | POA: Diagnosis not present

## 2021-09-16 LAB — PARATHYROID HORMONE, INTACT (NO CA): PTH: 6 pg/mL — ABNORMAL LOW (ref 15–65)

## 2021-09-16 NOTE — Progress Notes (Unsigned)
Location:  [place] Room Number: 160 Place of Service:  SNF (31)   CODE STATUS: dnr   Allergies  Allergen Reactions   Penicillins Rash    Did it involve swelling of the face/tongue/throat, SOB, or low BP? Unknown Did it involve sudden or severe rash/hives, skin peeling, or any reaction on the inside of your mouth or nose? Unknown Did you need to seek medical attention at a hospital or doctor's office? Unknown When did it last happen?       If all above answers are "NO", may proceed with cephalosporin use.     Chief Complaint  Patient presents with   Acute Visit    Follow up labs     HPI:  His lab work has returned. His calcium level is slightly better. His hgb is slightly improved. His tsh is much higher at 13.426. he is voicing no complaints; no pain; no muscle spasms; no changes in appetite.   Past Medical History:  Diagnosis Date   Abscess of left hip    "healing wound" per [facility] Heme/Onc MD note 06/03/18   Assistance needed for mobility    walker   Atrial fibrillation ([place])    BPH (benign prostatic hyperplasia)    Cancer ([facility])    thyroid   Chronic anticoagulation    Coronary artery disease    Diabetes mellitus without complication ([place])    Hematochezia 05/05/2018   colonoscopy at [facility] with diverticulosis    Hypertension    Thyroid disease     Past Surgical History:  Procedure Laterality Date   BIOPSY  09/06/2021   Procedure: BIOPSY;  Surgeon: Daneil Dolin, MD;  Location: [facility];  Service: Endoscopy;;  gastric   COLONOSCOPY WITH PROPOFOL N/A 09/06/2021   Procedure: COLONOSCOPY WITH PROPOFOL;  Surgeon: Daneil Dolin, MD;  Location: [facility];  Service: Endoscopy;  Laterality: N/A;   ESOPHAGOGASTRODUODENOSCOPY (EGD) WITH PROPOFOL N/A 09/06/2021   Procedure: ESOPHAGOGASTRODUODENOSCOPY (EGD) WITH PROPOFOL;  Surgeon: Daneil Dolin, MD;  Location: [facility];  Service: Endoscopy;  Laterality: N/A;   THYROIDECTOMY       Social History   Socioeconomic History   Marital status: Single    Spouse name: Not on file   Number of children: Not on file   Years of education: Not on file   Highest education level: Not on file  Occupational History   Not on file  Tobacco Use   Smoking status: Never   Smokeless tobacco: Never  Vaping Use   Vaping Use: Never used  Substance and Sexual Activity   Alcohol use: Never   Drug use: Never   Sexual activity: Not on file  Other Topics Concern   Not on file  Social History Narrative   [place]. NO KIDS. WORKED AT [employer] TIME UNTIL LAST [place] 2020.   Social Determinants of Health   Financial Resource Strain: Not on file  Food Insecurity: Not on file  Transportation Needs: Not on file  Physical Activity: Not on file  Stress: Not on file  Social Connections: Not on file  Intimate Partner Violence: Not on file   Family History  Problem Relation Age of Onset   Prostate cancer Father    Colon cancer Neg Hx    Colon polyps Neg Hx       VITAL SIGNS BP (!) 154/88   Pulse 80   Temp 98.1 F (36.7 C)  Resp 20   Ht '6\' 1"'$  (1.854 m)   Wt 218 lb 9.6 oz (99.2 kg)   SpO2 93%   BMI 28.84 kg/m   Outpatient Encounter Medications as of 09/16/2021  Medication Sig   apixaban (ELIQUIS) 5 MG TABS tablet Take 5 mg by mouth 2 (two) times daily.   Ascorbic Acid (VITAMIN C) 1000 MG tablet Take 1,000 mg by mouth daily.   atorvastatin (LIPITOR) 40 MG tablet Take 40 mg by mouth at bedtime.   calcitRIOL (ROCALTROL) 0.5 MCG capsule Take 1 capsule (0.5 mcg total) by mouth daily.   calcium carbonate (OS-CAL - DOSED IN MG OF ELEMENTAL CALCIUM) 1250 (500 Ca) MG tablet Take 2 tablets (2,500 mg total) by mouth 2 (two) times daily with a meal.   cholecalciferol (CHOLECALCIFEROL) 25 MCG tablet Take 1 tablet (1,000 Units total) by mouth daily.   feeding supplement (ENSURE ENLIVE / ENSURE PLUS) LIQD Take 237 mLs by mouth 2 (two) times daily between  meals.   lenvatinib 10 mg daily dose (LENVIMA, 10 MG DAILY DOSE,) capsule Take 10 mg by mouth See admin instructions. '10mg'$  daily for 2 weeks, off for 1 week, then repeat. (Patient not taking: Reported on 08/31/2021)   levothyroxine (SYNTHROID) 150 MCG tablet Take 1 tablet (150 mcg total) by mouth daily before breakfast.   lisinopril (ZESTRIL) 10 MG tablet Take 10 mg by mouth daily.   methenamine (HIPREX) 1 g tablet Take 1 g by mouth 2 (two) times daily.   metoprolol succinate (TOPROL-XL) 25 MG 24 hr tablet Take 1 tablet (25 mg total) by mouth daily.   Multiple Vitamin (MULTIVITAMIN) tablet Take 1 tablet by mouth daily.   ondansetron (ZOFRAN-ODT) 4 MG disintegrating tablet Take 4 mg by mouth every 8 (eight) hours as needed.   pantoprazole (PROTONIX) 40 MG tablet Take 1 tablet (40 mg total) by mouth 2 (two) times daily.   psyllium (METAMUCIL) 58.6 % packet Take 1 packet by mouth daily.   sodium chloride 1 g tablet Take 1 tablet (1 g total) by mouth daily.   No facility-administered encounter medications on file as of 09/16/2021.     SIGNIFICANT DIAGNOSTIC EXAMS  PREVIOUS   08-30-21: chest x-ray: 1. Trace bilateral pleural effusions.   09-08-21: chest x-ray:  New moderate right pleural effusion and stable tiny left pleural effusion. Associated basilar lung opacities likely represent atelectasis. Recommend clinical correlation for possible pneumonia.  NO NEW LABS.   LABS REVIEWED PREVIOUS   08-30-21; wbc 7.1; hgb 8.7; hct 25.4; mcv 91.0 plt 105; glucose 141; bun 21; creat 1.22; k+ 4.1; na++ 122; ca 6.0; gfr >60; protein 5.6; albumin 2.8; blood culture: no growth: urine culture: klebsiella oxytoca; 80,000 pseudomonas aeruginosa 08-31-21: wbc 5.7; hgb 9.0; hct 26.1; mcv 90.9 plt 103; glucose 115; bun 22; creat 1.17; k+ 4.6; na++ 128; ca 5.4; gfr >60; protein 5.3; albumin 2.7 hgb a1c 6.1 09-02-21: wbc 5.1; hgb 8.0; hct 22.8; mcv 91.2 plt 102; glucose 92; bun 17; creat 1.17; k+ 3.6; na++ 128; ca 5.4;  gfr >60 09-03-21: glucose 112; bun 10; creat 1.06; k+ 3.7; na++ 130; ca  5.8 gfr >60; protein 5.7 albumin 2.5 09-05-21: wbc 9.6; hgb 8.6; hct 25.2; mcv 92.3 plt 161; glucose 112; bun 7; creat 1.00; k+ 3.4; na++ 129; ca 6.3 gfr >60 mag 1.4 09-07-21:wbc 5.5; hgb 8.5; hct 25.5; mcv 95.1 plt 215l glucose 91; bun 7 creat 1.04 ;k+ 4.0; na++ 134; ca 6.3; gfr >60; phos 4.5; albumin 2.6; mag 1.7 09-09-21:  wbc 6.0; hgb 8.5; hct 25.4; mcv 94.1 plt 283; glucose 108; bun 10; creat 1.04; k+ 3.5; na++ 132; ca 6.1; gfr >60; phos 4.6; albumin 2.5   TODAY  09-11-21: wbc 5.4; hgb 8.6; hct 26.8;mcv 95.4 plt 354; glucose 99; bun 15; creat 1.04; k+ 3.8; na++ 133; ca 6.2 gfr >60; mag 1.5; phos 4.8  09-15-21: wbc 5.9; hgb 9.0; hct 27.9; mcv 96.5 plt 431; glucose 102; bun 18; creat 1.02 k+ 3.9; na++ 135; ca 7.0; gfr >60; tsh 13.426; free t4: 0.73 vitamin D 42.28; pth 6; mag 1.8   Review of Systems  Constitutional:  Negative for malaise/fatigue.  Respiratory:  Negative for cough and shortness of breath.   Cardiovascular:  Negative for chest pain, palpitations and leg swelling.  Gastrointestinal:  Negative for abdominal pain, constipation and heartburn.  Musculoskeletal:  Negative for back pain, joint pain and myalgias.  Skin: Negative.   Neurological:  Negative for dizziness.  Psychiatric/Behavioral:  The patient is not nervous/anxious.    Physical Exam Constitutional:      General: He is not in acute distress.    Appearance: He is well-developed. He is not diaphoretic.  Neck:     Thyroid: No thyromegaly.     Comments: S/p thyroidectomy  Cardiovascular:     Rate and Rhythm: Normal rate and regular rhythm.     Heart sounds: Normal heart sounds.  Pulmonary:     Effort: Pulmonary effort is normal. No respiratory distress.     Breath sounds: Normal breath sounds.  Abdominal:     General: Bowel sounds are normal. There is no distension.     Palpations: Abdomen is soft.     Tenderness: There is no abdominal  tenderness.  Genitourinary:    Comments: Suprapubic catheter  Musculoskeletal:        General: Normal range of motion.     Cervical back: Neck supple.     Right lower leg: Edema present.     Left lower leg: Edema present.     Comments: 2-3 bilateral lower extremity edema History of left great toe and second toe amputation   Lymphadenopathy:     Cervical: No cervical adenopathy.  Skin:    General: Skin is warm and dry.     Comments: Significant bruising right arm with a hematoma  Neurological:     Mental Status: He is alert. Mental status is at baseline.  Psychiatric:        Mood and Affect: Mood normal.       ASSESSMENT/ PLAN:  TODAY  Postsurgical hypothyroidism ABLA (acute blood loss anemia) Hypocalcemia:   Calcium level is improving at 7.0 Hgb is stable at 9.0 Will increase synthroid to 200 mcg daily  Will repeat tsh on 11-06-21.    Ok Edwards NP [facility]   call [phone]

## 2021-09-18 ENCOUNTER — Other Ambulatory Visit (HOSPITAL_COMMUNITY)
Admission: RE | Admit: 2021-09-18 | Discharge: 2021-09-18 | Disposition: A | Discharge status: Home / Self Care | Payer: Medicare HMO | Source: Skilled Nursing Facility | Attending: Adult Health | Admitting: Adult Health

## 2021-09-18 ENCOUNTER — Encounter: Payer: Self-pay | Admitting: Internal Medicine

## 2021-09-18 LAB — CBC
HCT: 29.3 % — ABNORMAL LOW (ref 39.0–52.0)
Hemoglobin: 9.5 g/dL — ABNORMAL LOW (ref 13.0–17.0)
MCH: 31 pg (ref 26.0–34.0)
MCHC: 32.4 g/dL (ref 30.0–36.0)
MCV: 95.8 fL (ref 80.0–100.0)
Platelets: 418 10*3/uL — ABNORMAL HIGH (ref 150–400)
RBC: 3.06 MIL/uL — ABNORMAL LOW (ref 4.22–5.81)
RDW: 18.1 % — ABNORMAL HIGH (ref 11.5–15.5)
WBC: 6.9 10*3/uL (ref 4.0–10.5)
nRBC: 0 % (ref 0.0–0.2)

## 2021-09-18 LAB — BASIC METABOLIC PANEL
Anion gap: 9 (ref 5–15)
BUN: 19 mg/dL (ref 8–23)
CO2: 28 mmol/L (ref 22–32)
Calcium: 7.3 mg/dL — ABNORMAL LOW (ref 8.9–10.3)
Chloride: 97 mmol/L — ABNORMAL LOW (ref 98–111)
Creatinine, Ser: 0.89 mg/dL (ref 0.61–1.24)
GFR, Estimated: >60 mL/min (ref >60)
Glucose, Bld: 104 mg/dL — ABNORMAL HIGH (ref 70–99)
Potassium: 3.8 mmol/L (ref 3.5–5.1)
Sodium: 134 mmol/L — ABNORMAL LOW (ref 135–145)

## 2021-09-18 LAB — PHOSPHORUS: Phosphorus: 4.9 mg/dL — ABNORMAL HIGH (ref 2.5–4.6)

## 2021-09-18 LAB — MAGNESIUM: Magnesium: 1.9 mg/dL (ref 1.7–2.4)

## 2021-09-19 ENCOUNTER — Other Ambulatory Visit: Payer: Self-pay | Admitting: Adult Health

## 2021-09-19 ENCOUNTER — Non-Acute Institutional Stay (SKILLED_NURSING_FACILITY): Payer: Medicare HMO | Admitting: Adult Health

## 2021-09-19 ENCOUNTER — Encounter: Payer: Self-pay | Admitting: Adult Health

## 2021-09-19 DIAGNOSIS — I700 Atherosclerosis of aorta: Secondary | ICD-10-CM | POA: Diagnosis not present

## 2021-09-19 DIAGNOSIS — D62 Acute posthemorrhagic anemia: Secondary | ICD-10-CM

## 2021-09-19 DIAGNOSIS — C73 Malignant neoplasm of thyroid gland: Secondary | ICD-10-CM | POA: Diagnosis not present

## 2021-09-19 DIAGNOSIS — E1159 Type 2 diabetes mellitus with other circulatory complications: Secondary | ICD-10-CM

## 2021-09-19 DIAGNOSIS — I152 Hypertension secondary to endocrine disorders: Secondary | ICD-10-CM

## 2021-09-19 MED ORDER — SODIUM CHLORIDE 1 G PO TABS: 1.0000 g | ORAL_TABLET | Freq: Every day | ORAL | 0 refills | Status: DC

## 2021-09-19 MED ORDER — APIXABAN 5 MG PO TABS: 5.0000 mg | ORAL_TABLET | Freq: Two times a day (BID) | ORAL | 0 refills | Status: DC

## 2021-09-19 MED ORDER — LISINOPRIL 10 MG PO TABS: 10.0000 mg | ORAL_TABLET | Freq: Every day | ORAL | 0 refills | Status: DC

## 2021-09-19 MED ORDER — ATORVASTATIN CALCIUM 40 MG PO TABS: 40.0000 mg | ORAL_TABLET | Freq: Every day | ORAL | 0 refills | Status: DC

## 2021-09-19 MED ORDER — CALCITRIOL 0.5 MCG PO CAPS: 0.5000 ug | ORAL_CAPSULE | Freq: Every day | ORAL | 0 refills | Status: DC

## 2021-09-19 MED ORDER — METHENAMINE HIPPURATE 1 G PO TABS: 1.0000 g | ORAL_TABLET | Freq: Two times a day (BID) | ORAL | 0 refills | Status: DC

## 2021-09-19 MED ORDER — METOPROLOL SUCCINATE ER 25 MG PO TB24: 25.0000 mg | ORAL_TABLET | Freq: Every day | ORAL | 0 refills | Status: DC

## 2021-09-19 MED ORDER — ONDANSETRON 4 MG PO TBDP: 4.0000 mg | ORAL_TABLET | Freq: Three times a day (TID) | ORAL | 0 refills | Status: DC | PRN

## 2021-09-19 MED ORDER — LEVOTHYROXINE SODIUM 150 MCG PO TABS: 150.0000 ug | ORAL_TABLET | Freq: Every day | ORAL | 0 refills | Status: DC

## 2021-09-19 MED ORDER — PANTOPRAZOLE SODIUM 40 MG PO TBEC: 40.0000 mg | DELAYED_RELEASE_TABLET | Freq: Two times a day (BID) | ORAL | 0 refills | Status: DC

## 2021-09-19 NOTE — Progress Notes (Signed)
Location:  [facility] Room Number: 160-P Place of Service:  SNF (31)   CODE STATUS: DNR  Allergies  Allergen Reactions   Penicillins Rash    Did it involve swelling of the face/tongue/throat, SOB, or low BP? Unknown Did it involve sudden or severe rash/hives, skin peeling, or any reaction on the inside of your mouth or nose? Unknown Did you need to seek medical attention at a hospital or doctor's office? Unknown When did it last happen?       If all above answers are "NO", may proceed with cephalosporin use.     Chief Complaint  Patient presents with   Discharge Note    HPI:  He is being discharged to home with home health for pt/ot. He does not need any dme; he will need his prescriptions written and to follow with his medical provider. He had been hospitalized for GI bleed; and electrolyte abnormalities. His calcium has improved is no longer critically low. He was admitted to this facility for short term rehab. He has participated in pt/ot to improve upon his level of independence with his adls. He is now ready for discharge to home.   Past Medical History:  Diagnosis Date   Abscess of left hip    "healing wound" per [facility] Heme/Onc MD note 06/03/18   Assistance needed for mobility    walker   Atrial fibrillation ([place])    BPH (benign prostatic hyperplasia)    Cancer (HCC)    thyroid   Chronic anticoagulation    Coronary artery disease    Diabetes mellitus without complication ([place])    Hematochezia 05/05/2018   colonoscopy at [facility] with diverticulosis    Hypertension    Thyroid disease     Past Surgical History:  Procedure Laterality Date   BIOPSY  09/06/2021   Procedure: BIOPSY;  Surgeon: Daneil Dolin, MD;  Location: [facility];  Service: Endoscopy;;  gastric   COLONOSCOPY WITH PROPOFOL N/A 09/06/2021   Procedure: COLONOSCOPY WITH PROPOFOL;  Surgeon: Daneil Dolin, MD;  Location: [facility];  Service: Endoscopy;  Laterality: N/A;    ESOPHAGOGASTRODUODENOSCOPY (EGD) WITH PROPOFOL N/A 09/06/2021   Procedure: ESOPHAGOGASTRODUODENOSCOPY (EGD) WITH PROPOFOL;  Surgeon: Daneil Dolin, MD;  Location: [facility];  Service: Endoscopy;  Laterality: N/A;   THYROIDECTOMY      Social History   Socioeconomic History   Marital status: Single    Spouse name: Not on file   Number of children: Not on file   Years of education: Not on file   Highest education level: Not on file  Occupational History   Not on file  Tobacco Use   Smoking status: Never   Smokeless tobacco: Never  Vaping Use   Vaping Use: Never used  Substance and Sexual Activity   Alcohol use: Never   Drug use: Never   Sexual activity: Not on file  Other Topics Concern   Not on file  Social History Narrative   [place]. NO KIDS. WORKED AT [employer] PART TIME UNTIL LAST [place] 2020.   Social Determinants of Health   Financial Resource Strain: Not on file  Food Insecurity: Not on file  Transportation Needs: Not on file  Physical Activity: Not on file  Stress: Not on file  Social Connections: Not on file  Intimate Partner Violence: Not on file   Family History  Problem Relation Age of Onset   Prostate cancer Father  Colon cancer Neg Hx    Colon polyps Neg Hx       VITAL SIGNS BP (!) 142/80   Pulse 86   Temp (!) 97.3 F (36.3 C)   Resp 20   Ht '6\' 1"'$  (1.854 m)   Wt 216 lb (98 kg)   SpO2 96%   BMI 28.50 kg/m   Outpatient Encounter Medications as of 09/19/2021  Medication Sig   Amino Acids-Protein Hydrolys (FEEDING SUPPLEMENT, PRO-STAT SUGAR FREE 64,) LIQD Take 30 mLs by mouth in the morning, at noon, in the evening, and at bedtime.   apixaban (ELIQUIS) 5 MG TABS tablet Take 1 tablet (5 mg total) by mouth 2 (two) times daily.   Ascorbic Acid (VITAMIN C) 1000 MG tablet Take 1,000 mg by mouth daily.   atorvastatin (LIPITOR) 40 MG tablet Take 1 tablet (40 mg total) by mouth at bedtime.   calcitRIOL (ROCALTROL) 0.5  MCG capsule Take 1 capsule (0.5 mcg total) by mouth daily.   calcium carbonate (OS-CAL - DOSED IN MG OF ELEMENTAL CALCIUM) 1250 (500 Ca) MG tablet Take 2 tablets (2,500 mg total) by mouth 2 (two) times daily with a meal.   Cholecalciferol (VITAMIN D3) 50 MCG (2000 UT) TABS Take 50 mcg by mouth daily.   feeding supplement (ENSURE ENLIVE / ENSURE PLUS) LIQD Take 237 mLs by mouth 2 (two) times daily between meals.   Ipratropium-Albuterol (COMBIVENT RESPIMAT) 20-100 MCG/ACT AERS respimat Inhale 1 puff into the lungs every 6 (six) hours.   levothyroxine (SYNTHROID) 200 MCG tablet Take 200 mcg by mouth daily.   lisinopril (ZESTRIL) 10 MG tablet Take 1 tablet (10 mg total) by mouth daily.   magnesium oxide (MAG-OX) 400 (240 Mg) MG tablet Take 400 mg by mouth 2 (two) times daily.   methenamine (HIPREX) 1 g tablet Take 1 tablet (1 g total) by mouth 2 (two) times daily.   metoprolol succinate (TOPROL-XL) 25 MG 24 hr tablet Take 1 tablet (25 mg total) by mouth daily.   Multiple Vitamin (MULTIVITAMIN) tablet Take 1 tablet by mouth daily.   ondansetron (ZOFRAN-ODT) 4 MG disintegrating tablet Take 1 tablet (4 mg total) by mouth every 8 (eight) hours as needed.   pantoprazole (PROTONIX) 40 MG tablet Take 1 tablet (40 mg total) by mouth 2 (two) times daily.   psyllium (METAMUCIL) 58.6 % packet Take 1 packet by mouth daily.   sodium chloride 1 g tablet Take 1 tablet (1 g total) by mouth daily.   zinc oxide 20 % ointment Apply 1 Application topically 3 (three) times daily.  Apply to sacrum, coccyx and bilateral buttocks every shift for prevention   [DISCONTINUED] cholecalciferol (CHOLECALCIFEROL) 25 MCG tablet Take 1 tablet (1,000 Units total) by mouth daily.   [DISCONTINUED] lenvatinib 10 mg daily dose (LENVIMA, 10 MG DAILY DOSE,) capsule Take 10 mg by mouth See admin instructions. '10mg'$  daily for 2 weeks, off for 1 week, then repeat. (Patient not taking: Reported on 08/31/2021)   [DISCONTINUED] levothyroxine  (SYNTHROID) 150 MCG tablet Take 1 tablet (150 mcg total) by mouth daily before breakfast.   No facility-administered encounter medications on file as of 09/19/2021.     SIGNIFICANT DIAGNOSTIC EXAMS  PREVIOUS   08-30-21: chest x-ray: 1. Trace bilateral pleural effusions.   09-08-21: chest x-ray:  New moderate right pleural effusion and stable tiny left pleural effusion. Associated basilar lung opacities likely represent atelectasis. Recommend clinical correlation for possible pneumonia.  NO NEW LABS.   LABS REVIEWED PREVIOUS   08-30-21;  wbc 7.1; hgb 8.7; hct 25.4; mcv 91.0 plt 105; glucose 141; bun 21; creat 1.22; k+ 4.1; na++ 122; ca 6.0; gfr >60; protein 5.6; albumin 2.8; blood culture: no growth: urine culture: klebsiella oxytoca; 80,000 pseudomonas aeruginosa 08-31-21: wbc 5.7; hgb 9.0; hct 26.1; mcv 90.9 plt 103; glucose 115; bun 22; creat 1.17; k+ 4.6; na++ 128; ca 5.4; gfr >60; protein 5.3; albumin 2.7 hgb a1c 6.1 09-02-21: wbc 5.1; hgb 8.0; hct 22.8; mcv 91.2 plt 102; glucose 92; bun 17; creat 1.17; k+ 3.6; na++ 128; ca 5.4; gfr >60 09-03-21: glucose 112; bun 10; creat 1.06; k+ 3.7; na++ 130; ca  5.8 gfr >60; protein 5.7 albumin 2.5 09-05-21: wbc 9.6; hgb 8.6; hct 25.2; mcv 92.3 plt 161; glucose 112; bun 7; creat 1.00; k+ 3.4; na++ 129; ca 6.3 gfr >60 mag 1.4 09-07-21:wbc 5.5; hgb 8.5; hct 25.5; mcv 95.1 plt 215l glucose 91; bun 7 creat 1.04 ;k+ 4.0; na++ 134; ca 6.3; gfr >60; phos 4.5; albumin 2.6; mag 1.7 09-09-21: wbc 6.0; hgb 8.5; hct 25.4; mcv 94.1 plt 283; glucose 108; bun 10; creat 1.04; k+ 3.5; na++ 132; ca 6.1; gfr >60; phos 4.6; albumin 2.5  09-11-21: wbc 5.4; hgb 8.6; hct 26.8;mcv 95.4 plt 354; glucose 99; bun 15; creat 1.04; k+ 3.8; na++ 133; ca 6.2 gfr >60; mag 1.5; phos 4.8  09-15-21: wbc 5.9; hgb 9.0; hct 27.9; mcv 96.5 plt 431; glucose 102; bun 18; creat 1.02 k+ 3.9; na++ 135; ca 7.0; gfr >60; tsh 13.426; free t4: 0.73 vitamin D 42.28; pth 6; mag 1.8   NO NEW LABS.   Review of  Systems  Constitutional:  Negative for malaise/fatigue.  Respiratory:  Negative for cough and shortness of breath.   Cardiovascular:  Negative for chest pain, palpitations and leg swelling.  Gastrointestinal:  Negative for abdominal pain, constipation and heartburn.  Musculoskeletal:  Negative for back pain, joint pain and myalgias.  Skin: Negative.   Neurological:  Negative for dizziness.  Psychiatric/Behavioral:  The patient is not nervous/anxious.    Physical Exam Constitutional:      General: He is not in acute distress.    Appearance: He is well-developed. He is not diaphoretic.  Neck:     Comments:  S/p thyroidectomy  Cardiovascular:     Rate and Rhythm: Normal rate and regular rhythm.     Heart sounds: Normal heart sounds.  Pulmonary:     Effort: Pulmonary effort is normal. No respiratory distress.     Breath sounds: Normal breath sounds.  Abdominal:     General: Bowel sounds are normal. There is no distension.     Palpations: Abdomen is soft.     Tenderness: There is no abdominal tenderness.  Genitourinary:    Comments: Suprapubic catheter  Musculoskeletal:     Cervical back: Neck supple.     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  Skin:    General: Skin is warm and dry.     Comments: Some bruising present; is fading   Neurological:     Mental Status: He is alert and oriented to person, place, and time.  Psychiatric:        Mood and Affect: Mood normal.      ASSESSMENT/ PLAN:  patient is being discharged with the following home health services:  pt/ot to evaluate and treat as indicated for gait balance strength adl training.  Patient is being discharged with the following durable medical equipment:  none needed   Patient has been advised to f/u with their PCP in 1-2 weeks to for a transitions of care visit.  Social services at their facility  was responsible for arranging this appointment.  Pt was provided with adequate prescriptions of noncontrolled medications to reach the scheduled appointment .  For controlled substances, a limited supply was provided as appropriate for the individual patient.  If the pt normally receives these medications from a pain clinic or has a contract with another physician, these medications should be received from that clinic or physician only).   A 30 day supply of his prescription medications have been sent to  pharmacy  Time spent with patient: medications home health dme.    Ok Edwards NP [facility]   call [phone]

## 2021-12-22 DIAGNOSIS — K219 Gastro-esophageal reflux disease without esophagitis: Secondary | ICD-10-CM | POA: Insufficient documentation

## 2021-12-22 DIAGNOSIS — M1990 Unspecified osteoarthritis, unspecified site: Secondary | ICD-10-CM | POA: Insufficient documentation

## 2023-01-15 ENCOUNTER — Other Ambulatory Visit: Payer: Self-pay | Admitting: Adult Health

## 2023-02-18 ENCOUNTER — Encounter: Payer: Self-pay | Admitting: "Endocrinology

## 2023-02-18 ENCOUNTER — Ambulatory Visit (INDEPENDENT_AMBULATORY_CARE_PROVIDER_SITE_OTHER): Payer: Medicare HMO | Admitting: "Endocrinology

## 2023-02-18 VITALS — BP 150/78 | HR 84 | Ht 73.0 in | Wt 213.0 lb

## 2023-02-18 DIAGNOSIS — C73 Malignant neoplasm of thyroid gland: Secondary | ICD-10-CM | POA: Diagnosis not present

## 2023-02-18 DIAGNOSIS — E890 Postprocedural hypothyroidism: Secondary | ICD-10-CM

## 2023-02-18 NOTE — Progress Notes (Signed)
 Endocrinology Consult Note                                            02/18/2023, 3:06 PM   Subjective:    Patient ID: Paul Bradley, male    DOB: 04-May-1945, PCP Orpha Yancey LABOR, MD   Past Medical History:  Diagnosis Date   Abscess of left hip    healing wound per [facility] Heme/Onc MD note 06/03/18   Assistance needed for mobility    walker   Atrial fibrillation (HCC)    BPH (benign prostatic hyperplasia)    Cancer (HCC)    thyroid    Chronic anticoagulation    Coronary artery disease    Diabetes mellitus without complication (HCC)    Hematochezia 05/05/2018   colonoscopy at [facility] with diverticulosis    Hypertension    Thyroid  disease    Past Surgical History:  Procedure Laterality Date   BIOPSY  09/06/2021   Procedure: BIOPSY;  Surgeon: Shaaron Lamar HERO, MD;  [facility];  Service: Endoscopy;;  gastric   CATARACT EXTRACTION  2022   COLONOSCOPY WITH PROPOFOL  N/A 09/06/2021   Procedure: COLONOSCOPY WITH PROPOFOL ;  Surgeon: Shaaron Lamar HERO, MD;  [facility];  Service: Endoscopy;  Laterality: N/A;   ESOPHAGOGASTRODUODENOSCOPY (EGD) WITH PROPOFOL  N/A 09/06/2021   Procedure: ESOPHAGOGASTRODUODENOSCOPY (EGD) WITH PROPOFOL ;  Surgeon: Shaaron Lamar HERO, MD;  [facility];  Service: Endoscopy;  Laterality: N/A;   NECK DISSECTION  12/20/2012   THYROIDECTOMY     TOE AMPUTATION  06/27/2020   Social History   Socioeconomic History   Marital status: Single    Spouse name: Not on file   Number of children: Not on file   Years of education: Not on file   Highest education level: Not on file  Occupational History   Not on file  Tobacco Use   Smoking status: Never   Smokeless tobacco: Never  Vaping Use   Vaping status: Never Used  Substance and Sexual Activity   Alcohol use: Never   Drug use: Never   Sexual activity: Not on file  Other Topics Concern   Not on file  Social History Narrative   SISTER HELPS HIM CARE FOR HIMSELF. NO  KIDS. WORKED AT [employer] PART TIME UNTIL LAST JAN 2020.   Social Drivers of Corporate Investment Banker Strain: Not on file  Food Insecurity: Not on file  Transportation Needs: No Transportation Needs (02/05/2023)   Received from [facility]   [facility] - Transportation    Lack of Transportation (Medical): No    Lack of Transportation (Non-Medical): No  Physical Activity: Not on file  Stress: Not on file  Social Connections: Not on file   Family History  Problem Relation Age of Onset   Prostate cancer Father    Colon cancer Neg Hx    Colon polyps Neg Hx    Outpatient Encounter Medications as of 02/18/2023  Medication Sig   Amino Acids-Protein Hydrolys (FEEDING SUPPLEMENT, PRO-STAT SUGAR FREE 64,) LIQD Take 30 mLs by mouth in the morning, at noon, in the evening, and at bedtime.   apixaban  (ELIQUIS ) 5 MG TABS tablet Take 1 tablet (5 mg total) by mouth 2 (two) times daily.   Ascorbic Acid  (VITAMIN C) 1000 MG tablet Take 1,000 mg by mouth daily. (Patient not  taking: Reported on 02/18/2023)   atorvastatin  (LIPITOR) 40 MG tablet Take 1 tablet (40 mg total) by mouth at bedtime. (Patient not taking: Reported on 02/18/2023)   calcitRIOL  (ROCALTROL ) 0.5 MCG capsule Take 1 capsule (0.5 mcg total) by mouth daily.   calcium  carbonate (OS-CAL - DOSED IN MG OF ELEMENTAL CALCIUM ) 1250 (500 Ca) MG tablet Take 2 tablets (2,500 mg total) by mouth 2 (two) times daily with a meal.   Cholecalciferol  (VITAMIN D3) 50 MCG (2000 UT) TABS Take 50 mcg by mouth daily.   feeding supplement (ENSURE ENLIVE / ENSURE PLUS) LIQD Take 237 mLs by mouth 2 (two) times daily between meals.   Ipratropium-Albuterol  (COMBIVENT RESPIMAT) 20-100 MCG/ACT AERS respimat Inhale 1 puff into the lungs every 6 (six) hours.   levothyroxine  (SYNTHROID ) 200 MCG tablet Take 200 mcg by mouth daily.   lisinopril  (ZESTRIL ) 10 MG tablet Take 1 tablet (10 mg total) by mouth daily.   magnesium  oxide (MAG-OX) 400 (240 Mg) MG tablet Take 400 mg  by mouth 2 (two) times daily.   methenamine  (HIPREX ) 1 g tablet Take 1 tablet (1 g total) by mouth 2 (two) times daily.   metoprolol  succinate (TOPROL -XL) 25 MG 24 hr tablet Take 1 tablet (25 mg total) by mouth daily.   Multiple Vitamin (MULTIVITAMIN) tablet Take 1 tablet by mouth daily.   ondansetron  (ZOFRAN -ODT) 4 MG disintegrating tablet Take 1 tablet (4 mg total) by mouth every 8 (eight) hours as needed.   pantoprazole  (PROTONIX ) 40 MG tablet Take 1 tablet (40 mg total) by mouth 2 (two) times daily.   psyllium (METAMUCIL) 58.6 % packet Take 1 packet by mouth daily.   sodium chloride  1 g tablet Take 1 tablet (1 g total) by mouth daily.   zinc oxide 20 % ointment Apply 1 Application topically 3 (three) times daily.  Apply to sacrum, coccyx and bilateral buttocks every shift for prevention   No facility-administered encounter medications on file as of 02/18/2023.   ALLERGIES: Allergies  Allergen Reactions   Penicillins Rash    Did it involve swelling of the face/tongue/throat, SOB, or low BP? Unknown Did it involve sudden or severe rash/hives, skin peeling, or any reaction on the inside of your mouth or nose? Unknown Did you need to seek medical attention at a hospital or doctor's office? Unknown When did it last happen?       If all above answers are "NO", may proceed with cephalosporin use.     VACCINATION STATUS: Immunization History  Administered Date(s) Administered   Influenza Split 01/01/2012, 10/26/2014   Influenza, High Dose Seasonal PF 11/17/2013, 11/06/2015, 09/30/2020   Moderna Covid-19 Fall Seasonal Vaccine 68yrs & older 10/29/2022   Moderna SARS-COV2 Booster Vaccination 11/11/2020   Moderna Sars-Covid-2 Vaccination 03/29/2019, 04/26/2019, 10/24/2019, 05/01/2020   PNEUMOCOCCAL CONJUGATE-20 09/16/2021   Pneumococcal Conjugate-13 10/26/2014   Tdap 12/30/2020   Zoster Recombinant(Shingrix) 04/23/2021, 06/25/2021    HPI Paul Bradley is 78 y.o. male who presents today  with a medical history as above.  His history is long and complicated.  He is accompanied by his sister.  He is currently in a nursing home at [facility] in [place] .  He is a patient with recurrent papillary thyroid  carcinoma and multiple other chronic medical problems. He was first diagnosed in 2011, underwent 3 different thyroid  cancer surgeries as well as remnant ablation attempts.  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 He was also treated with lenvatinib on and off  for 4 years (2020-2024).  His last visit with oncology at [facility] was in November  2024 at which time a decision was made for no further treatments for thyroid  cancer. He is on palliative care at nursing home while he is getting replacement treatment for thyroid  hormone, calcium /vitamin D .  Patient continues to take oral medications.  He does not have acute complaints today.  At baseline, he is wheelchair-bound, participating in his care. His current medications include levothyroxine  175 mcg p.o. daily before breakfast. His also on calcium  citrate-vitamin D  250-5 mcg twice a day, vitamin D3 2000 units daily, Rocaltrol  0.5 mcg p.o. daily. His next of kin, Damien Sole corroborates and elaborates on his history as he is a suboptimal historian.   More recently, mainly due to treatment withdrawal, he was having hypocalcemia.  Review of Systems  Constitutional: +mildly fluctuating body weight , no fatigue, no subjective hyperthermia, no subjective hypothermia Eyes: no blurry vision, no xerophthalmia ENT: no sore throat, no nodules palpated in throat, no dysphagia/odynophagia, no hoarseness Cardiovascular: no Chest Pain, + shortness of breath on exertion,  no palpitations, no leg swelling Neurological: no tremors, no numbness, no tingling, no dizziness Psychiatric: no depression, no anxiety  Objective:       02/18/2023    2:39 PM 09/19/2021    1:55 PM 09/16/2021   10:11 AM   Vitals with BMI  Height 6' 1 6' 1 6' 1  Weight 213 lbs 216 lbs 218 lbs 10 oz  BMI 28.11 28.5 28.85  Systolic  142 154  Diastolic  80 88  Pulse 84 86 80    Pulse 84   Ht 6' 1 (1.854 m)   Wt 213 lb (96.6 kg)   BMI 28.10 kg/m   Wt Readings from Last 3 Encounters:  02/18/23 213 lb (96.6 kg)  09/19/21 216 lb (98 kg)  09/16/21 218 lb 9.6 oz (99.2 kg)    Physical Exam  Constitutional:  Body mass index is 28.1 kg/m.,  not in acute distress, normal state of mind Eyes: PERRLA, EOMI, no exophthalmos ENT: moist mucous membranes, + thyroidectomy scar on anterior lower neck,  no gross cervical lymphadenopathy Cardiovascular: normal precordial activity, Regular Rate and Rhythm, no Murmur/Rubs/Gallops Respiratory:  adequate breathing efforts, + poor air entry bilaterally. Gastrointestinal: abdomen soft, Non -tender, No distension, Bowel Sounds present, no gross organomegaly Musculoskeletal: + Bilateral lower extremity dependent edema.    Neurological: no tremor with outstretched hands, Deep tendon reflexes normal in bilateral lower extremities.   Component 12/18/22 12/16/22 09/19/19  Sodium -- 140 131 Low   Potassium -- 3.9 3.5  Chloride -- 97 Low  96 Low   CO2 -- 32.1 High  28.9  Anion Gap -- 11 6  BUN -- 22 High  21 High   Creatinine -- 2.04 High  1.30  BUN/Creatinine Ratio -- 11 16  eGFR CKD-EPI (2021) Male -- 33 Low   --  Glucose -- 108 124  Calcium  -- 6.1 Low Panic  7.9 Low   Albumin 2.7 Low  3 Low  --  Total Protein -- 8 --  Total Bilirubin -- 0.5 --  AST -- 11 Low  --  ALT -- 12 --  Alkaline Phosphatase -- 56 --   CMP ( most recent) CMP     Component Value Date/Time   NA 134 (L) 09/18/2021 0540   K 3.8 09/18/2021 0540   CL 97 (L) 09/18/2021 0540   CO2 28 09/18/2021 0540   GLUCOSE 104 (H)  09/18/2021 0540   BUN 19 09/18/2021 0540   CREATININE 0.89 09/18/2021 0540   CALCIUM  7.3 (L) 09/18/2021 0540   PROT 5.2 (L) 09/04/2021 0336   ALBUMIN 2.5 (L) 09/09/2021  0333   AST 22 09/04/2021 0336   ALT 30 09/04/2021 0336   ALKPHOS 43 09/04/2021 0336   BILITOT 1.2 09/04/2021 0336   GFRNONAA >60 09/18/2021 0540     Diabetic Labs (most recent): Lab Results  Component Value Date   HGBA1C 6.1 (H) 08/31/2021     Lab Results  Component Value Date   TSH 13.426 (H) 09/15/2021   TSH 5.376 (H) 06/08/2018   TSH 3.919 11/11/2009   FREET4 0.73 09/15/2021   FREET4 0.94 11/11/2009       Assessment & Plan:   Postsurgical hypothyroidism   2.  Postsurgical hypocalcemia/hypoparathyroidism  3.  Recurrent, metastatic, iodine refractory papillary thyroid  cancer   - Zavier Canela Hagin  is being seen at a kind request of Hasanaj, Yancey LABOR, MD. - I have reviewed his available  records and clinically evaluated the patient. - Based on these reviews, he has recurrent, metastatic, iodine-refractory papillary thyroid  cancer, patient on palliative care. According to last oncology note from [facility] in November 2024  and as corroborated by the patient and his sister, no further treatment was planned or recommended.  Regarding his postsurgical hypothyroidism, his nursing home records show that he should be on levothyroxine  175 mcg p.o. daily.  His recent labs from December 03, 2022 showed TSH 2.43.  This labs also show thyroglobulin detectable at 3.3 with undetectable thyroglobulin bodies.  I advised the family to continue levothyroxine  175 mcg p.o. daily before breakfast.  Regarding his hypocalcemia which is likely secondary to his parathyroid  ablation during surgery and radiation, he will continue to need calcium  supplements. Calcium  citrate is preferred and advised to continue calcium  citrate-vitamin D  250 mg - 5 mcg p.o. twice daily with lunch and supper.  He is also advised to continue vitamin D3 2000 units p.o. daily at lunch.  In light of his postsurgical hypoparathyroidism, he will continue to benefit from continued treatment with Rocaltrol  0.5 mcg p.o. daily at  lunch.  He will have repeat labs for TSH, free T4, CMP, PTH, magnesium , before his next visit in 3 months.  - he is advised to maintain close follow up with Hasanaj, Yancey LABOR, MD for primary care needs.   -Thank you for involving me in the care of this pleasant patient.  Time spent with the patient: 46  minutes, of which >50% was spent in  counseling him about his for surgical hypothyroidism, history of thyroid  malignancy, postsurgical hypocalcemia/hypoparathyroidism and the rest in obtaining information about his symptoms, reviewing his previous labs/studies ( including abstractions from other facilities),  evaluations, and treatments,  and developing a plan to confirm diagnosis and long term treatment based on the latest standards of care/guidelines; and documenting his care.  Paul Bradley participated in the discussions, expressed understanding, and voiced agreement with the above plans.  All questions were answered to his satisfaction. he is encouraged to contact clinic should he have any questions or concerns prior to his return visit.  Follow up plan: No follow-ups on file.   Ranny Earl, MD [facility] [address] [place], [place] [address] Phone: [phone]  Fax: [phone]     02/18/2023, 3:06 PM  This note was partially dictated with voice recognition software. Similar sounding words can be transcribed inadequately or  may not  be corrected upon review.

## 2023-03-01 ENCOUNTER — Telehealth: Payer: Self-pay | Admitting: "Endocrinology

## 2023-03-01 NOTE — Telephone Encounter (Signed)
 Paul Bradley at [facility] called and said that his family told her that he is suppose to be on certain medications that Dr Fransico Him advised on at his visit but they have no record of this. Can you call her at [phone] and ask for [employer].

## 2023-03-02 NOTE — Telephone Encounter (Signed)
 Tried to return call to Villanueva with [facility] but did not receive an answer and was unable to leave a message.

## 2023-04-05 DIAGNOSIS — Z515 Encounter for palliative care: Secondary | ICD-10-CM | POA: Insufficient documentation

## 2023-04-27 DIAGNOSIS — I509 Heart failure, unspecified: Secondary | ICD-10-CM | POA: Insufficient documentation

## 2023-04-30 DIAGNOSIS — N401 Enlarged prostate with lower urinary tract symptoms: Secondary | ICD-10-CM | POA: Insufficient documentation

## 2023-04-30 NOTE — Progress Notes (Signed)
 Name: Paul Bradley DOB: 04-23-1945 MRN: [id number]  History of Present Illness: Paul Bradley is a 78 y.o. male who presents today as a new patient at [facility]. All available relevant medical records have been reviewed; previously seen at [facility] He resides at [facility] in [place], [place] and is accompanied by his sister Paul Bradley, who provided today's history due to patient's cognitive impairment. GU History includes: 1. BPH with BOO & LUTS. 2. Urinary retention. - Previous urodynamic testing revealed early normal sensation with a large capacity and bladder areflexia. - Managed with SP tube. - Currently taking Hiprex  1 gram twice daily for UTI prophylaxis. 3. Family history of prostate cancer (father). 4. Renal cysts.  He is transferring care from [facility] due to closer geographical distance to our office from his SNF.   He was admitted 04/05/2023 - 04/09/2023 for sepsis with AKI, tubular necrosis, and metabolic encephalopathy. Urine culture grew mixed gram-negative organisms.   Since discharge his renal function has improved. Most recently on 04/23/2023 his GFR was 33 and his creatinine was 2.02.   Today sister reports concern today for UTI. She reports that he has systemic symptoms of this by stating "he is really tired, his speech is really slurred, he seems confused...not wanting to play bingo" like he normally would. Also reports that Mr. Charland is fidgeting more in his chair. Of note, she said Mr. Rodwell is unable to tolerate oral thermometer so no temperature taken in clinic today.  She isn't sure when his SP tube was last exchanged but states that typically it does seem to be getting exchanged at least once per month.   Medications: Current Outpatient Medications  Medication Sig Dispense Refill   AMBULATORY NON FORMULARY MEDICATION Staff at patient's SNF are instructed to exchange his SP tube at least once every 4 weeks and  sooner as needed due to clogging. 1 each PRN   AMBULATORY NON FORMULARY MEDICATION Staff at patient's SNF are instructed to apply a 4x4 split gauze dressing around base on SP tube site which is to be exchanged once daily and as needed when soiled. 1 each PRN   AMBULATORY NON FORMULARY MEDICATION Staff at patient's SNF are instructed to irrigate patient's SP tube / bladder with 30-50 ml of sterile water once daily. 1 each PRN   Amino Acids-Protein Hydrolys (FEEDING SUPPLEMENT, PRO-STAT SUGAR FREE 64,) LIQD Take 30 mLs by mouth in the morning, at noon, in the evening, and at bedtime.     apixaban  (ELIQUIS ) 5 MG TABS tablet Take 1 tablet (5 mg total) by mouth 2 (two) times daily. 60 tablet 0   Ascorbic Acid  (VITAMIN C) 1000 MG tablet Take 1,000 mg by mouth daily.     atorvastatin  (LIPITOR) 40 MG tablet Take 1 tablet (40 mg total) by mouth at bedtime. 30 tablet 0   calcitRIOL  (ROCALTROL ) 0.5 MCG capsule Take 1 capsule (0.5 mcg total) by mouth daily. 30 capsule 0   Calcium  Citrate-Vitamin D  250-1.25 MG-MCG TABS Take 1 tablet by mouth 2 (two) times daily with a meal. With lunch and supper     Cholecalciferol  (VITAMIN D3) 50 MCG (2000 UT) TABS Take 50 mcg by mouth daily with lunch.     feeding supplement (ENSURE ENLIVE / ENSURE PLUS) LIQD Take 237 mLs by mouth 2 (two) times daily between meals. 237 mL 12   Ipratropium-Albuterol  (COMBIVENT RESPIMAT) 20-100 MCG/ACT AERS respimat Inhale 1 puff into the lungs every  6 (six) hours.     levothyroxine  (SYNTHROID ) 175 MCG tablet Take 175 mcg by mouth daily before breakfast.     lisinopril  (ZESTRIL ) 10 MG tablet Take 1 tablet (10 mg total) by mouth daily. 30 tablet 0   magnesium  oxide (MAG-OX) 400 (240 Mg) MG tablet Take 400 mg by mouth 2 (two) times daily.     methenamine  (HIPREX ) 1 g tablet Take 1 tablet (1 g total) by mouth 2 (two) times daily. 60 tablet 0   metoprolol  succinate (TOPROL -XL) 25 MG 24 hr tablet Take 1 tablet (25 mg total) by mouth daily. 30 tablet 0    Multiple Vitamin (MULTIVITAMIN) tablet Take 1 tablet by mouth daily.     ondansetron  (ZOFRAN -ODT) 4 MG disintegrating tablet Take 1 tablet (4 mg total) by mouth every 8 (eight) hours as needed. 20 tablet 0   pantoprazole  (PROTONIX ) 40 MG tablet Take 1 tablet (40 mg total) by mouth 2 (two) times daily. 60 tablet 0   psyllium (METAMUCIL) 58.6 % packet Take 1 packet by mouth daily. 30 each 12   sodium chloride  1 g tablet Take 1 tablet (1 g total) by mouth daily. 30 tablet 0   sulfamethoxazole -trimethoprim  (BACTRIM ) 400-80 MG tablet Take 1 tablet by mouth 2 (two) times daily. 14 tablet 0   zinc oxide 20 % ointment Apply 1 Application topically 3 (three) times daily.  Apply to sacrum, coccyx and bilateral buttocks every shift for prevention     Current Facility-Administered Medications  Medication Dose Route Frequency Provider Last Rate Last Admin   ciprofloxacin  (CIPRO ) tablet 500 mg  500 mg Oral Once [place] Arroyave C, FNP        Allergies: Allergies  Allergen Reactions   Penicillins Rash    Did it involve swelling of the face/tongue/throat, SOB, or low BP? Unknown Did it involve sudden or severe rash/hives, skin peeling, or any reaction on the inside of your mouth or nose? Unknown Did you need to seek medical attention at a hospital or doctor's office? Unknown When did it last happen?       If all above answers are "NO", may proceed with cephalosporin use.     Past Medical History:  Diagnosis Date   Abscess of left hip    "healing wound" per [facility] Heme/Onc MD note 06/03/18   Assistance needed for mobility    walker   Atrial fibrillation (HCC)    BPH (benign prostatic hyperplasia)    Cancer (HCC)    thyroid    Chronic anticoagulation    Coronary artery disease    Diabetes mellitus without complication (HCC)    Hematochezia 05/05/2018   colonoscopy at [facility] with diverticulosis    Hypertension    Thyroid  disease    Past Surgical History:  Procedure Laterality Date   BIOPSY   09/06/2021   Procedure: BIOPSY;  Surgeon: Suzette Espy, MD;  Location: [facility];  Service: Endoscopy;;  gastric   CATARACT EXTRACTION  2022   COLONOSCOPY WITH PROPOFOL  N/A 09/06/2021   Procedure: COLONOSCOPY WITH PROPOFOL ;  Surgeon: Suzette Espy, MD;  Location: [facility];  Service: Endoscopy;  Laterality: N/A;   ESOPHAGOGASTRODUODENOSCOPY (EGD) WITH PROPOFOL  N/A 09/06/2021   Procedure: ESOPHAGOGASTRODUODENOSCOPY (EGD) WITH PROPOFOL ;  Surgeon: Suzette Espy, MD;  Location: [facility];  Service: Endoscopy;  Laterality: N/A;   NECK DISSECTION  12/20/2012   THYROIDECTOMY     TOE AMPUTATION  06/27/2020   Family History  Problem Relation Age of Onset  Prostate cancer Father    Colon cancer Neg Hx    Colon polyps Neg Hx    Social History   Socioeconomic History   Marital status: Single    Spouse name: Not on file   Number of children: Not on file   Years of education: Not on file   Highest education level: Not on file  Occupational History   Not on file  Tobacco Use   Smoking status: Never   Smokeless tobacco: Never  Vaping Use   Vaping status: Never Used  Substance and Sexual Activity   Alcohol use: Never   Drug use: Never   Sexual activity: Not on file  Other Topics Concern   Not on file  Social History Narrative   SISTER HELPS HIM CARE FOR HIMSELF. NO KIDS. WORKED AT [employer] TIME UNTIL LAST JAN 2020.   Social Drivers of Corporate investment banker Strain: Low Risk  (04/05/2023)   Received from [facility]   Overall Financial Resource Strain (CARDIA)    Difficulty of Paying Living Expenses: Not hard at all  Food Insecurity: No Food Insecurity (04/05/2023)   Received from [facility]   Hunger Vital Sign    Worried About Running Out of Food in the Last Year: Never true    Ran Out of Food in the Last Year: Never true  Transportation Needs: No Transportation Needs (04/16/2023)   Received from [facility] - Transportation     Lack of Transportation (Medical): No    Lack of Transportation (Non-Medical): No  Physical Activity: Inactive (04/05/2023)   Received from [facility]   Exercise Vital Sign    Days of Exercise per Week: 0 days    Minutes of Exercise per Session: 0 min  Stress: No Stress Concern Present (04/05/2023)   Received from [facility] - Occupational Stress Questionnaire    Feeling of Stress : Only a little  Social Connections: Moderately Isolated (04/05/2023)   Received from [facility]   Social Connection and Isolation Panel [NHANES]    Frequency of Communication with Friends and Family: Three times a week    Frequency of Social Gatherings with Friends and Family: Three times a week    Attends Religious Services: 1 to 4 times per year    Active Member of Clubs or Organizations: No    Attends Banker Meetings: Never    Marital Status: Widowed  Intimate Partner Violence: Not At Risk (04/05/2023)   Received from [facility]   Humiliation, Afraid, Rape, and Kick questionnaire    Fear of Current or Ex-Partner: No    Emotionally Abused: No    Physically Abused: No    Sexually Abused: No    SUBJECTIVE  Patient unable to participate in Review of Systems GU: As per HPI.  OBJECTIVE Vitals:   05/03/23 0955  BP: (!) 91/59  Pulse: 87   There is no height or weight on file to calculate BMI.  Physical Examination Constitutional: No obvious distress; patient is non-toxic appearing  Cardiovascular: No visible lower extremity edema; mild pallor Respiratory: The patient does not have audible wheezing/stridor; respirations do not appear labored  Gastrointestinal: Abdomen non-distended Musculoskeletal: Normal ROM of UEs  Skin: No obvious rashes/open sores  Neurologic: CN 2-12 grossly intact Psychiatric: Patient non-verbal during today's visit  Hematologic/Lymphatic/Immunologic: No obvious bruises or sites of spontaneous  bleeding  GU: SP tube  site is dry with no surrounding erythema, warmth, edema, or tenderness. Small area of granulation tissue to 6 o'clock aspect of SP tube insertion site which bled slightly when catheter manipulated. SP tube is draining clear yellow urine into catheter bag.   ASSESSMENT Retention of urine - Plan: ciprofloxacin  (CIPRO ) tablet 500 mg, PR CHANGE CYSTOSTOMY TUBE SIMPLE, sulfamethoxazole -trimethoprim  (BACTRIM ) 400-80 MG tablet, AMBULATORY NON FORMULARY MEDICATION, AMBULATORY NON FORMULARY MEDICATION, AMBULATORY NON FORMULARY MEDICATION, CANCELED: Urinalysis, Routine w reflex microscopic, CANCELED: BLADDER SCAN AMB NON-IMAGING  Detrusor areflexia - Plan: ciprofloxacin  (CIPRO ) tablet 500 mg, PR CHANGE CYSTOSTOMY TUBE SIMPLE, sulfamethoxazole -trimethoprim  (BACTRIM ) 400-80 MG tablet, AMBULATORY NON FORMULARY MEDICATION, AMBULATORY NON FORMULARY MEDICATION, AMBULATORY NON FORMULARY MEDICATION, CANCELED: Urinalysis, Routine w reflex microscopic, CANCELED: BLADDER SCAN AMB NON-IMAGING  Suprapubic catheter (HCC) - Plan: ciprofloxacin  (CIPRO ) tablet 500 mg, PR CHANGE CYSTOSTOMY TUBE SIMPLE, sulfamethoxazole -trimethoprim  (BACTRIM ) 400-80 MG tablet, AMBULATORY NON FORMULARY MEDICATION, AMBULATORY NON FORMULARY MEDICATION, AMBULATORY NON FORMULARY MEDICATION, CANCELED: Urinalysis, Routine w reflex microscopic, CANCELED: BLADDER SCAN AMB NON-IMAGING  Benign prostatic hyperplasia with urinary retention - Plan: ciprofloxacin  (CIPRO ) tablet 500 mg, PR CHANGE CYSTOSTOMY TUBE SIMPLE, sulfamethoxazole -trimethoprim  (BACTRIM ) 400-80 MG tablet, AMBULATORY NON FORMULARY MEDICATION, AMBULATORY NON FORMULARY MEDICATION, AMBULATORY NON FORMULARY MEDICATION, CANCELED: Urinalysis, Routine w reflex microscopic, CANCELED: BLADDER SCAN AMB NON-IMAGING  UTI symptoms - Plan: sulfamethoxazole -trimethoprim  (BACTRIM ) 400-80 MG tablet  SP tube was exchanged by nursing staff.  Discussed small area of granulation tissue  with patient's sister with option to perform chemical cautery using silver nitrate swab(s) PRN, which was not determined to be necessary at this time. Advised to keep 4x4 split gauze dressing around base on SP tube site to minimize skin irritation; exchange once daily.   Also advised: - ongoing SP tube exchanges to be performed by staff at his SNF at least once every 4 weeks and sooner if needed due to clogging.  - staff at his SNF are to irrigate patient's SP tube and bladder with 30-50 ml of sterile water once daily. - Continue methenamine  hippurate (Hiprex ) 1 gram twice daily to suppress chronic bacteriuria / to minimize risk for catheter-associated UTI (CAUTI).  - Acute course of antibiotics today due to acute systemic UTI symptoms.   We agreed to plan for follow up in 6 months or sooner if needed. Patient verbalized understanding of and agreement with current plan. All questions were answered.  PLAN Advised the following: Staff at patient's SNF are instructed to exchange his SP tube at least once every 4 weeks and sooner as needed due to clogging.  Staff at patient's SNF are instructed to apply a 4x4 split gauze dressing around base on SP tube site which is to be exchanged once daily and as needed when soiled.  Staff at patient's SNF are instructed to irrigate patient's SP tube / bladder with 30-50 ml of sterile water once daily. Continue methenamine  hippurate (Hiprex ) 1 gram twice daily. Bactrim  SS (renal dosing) 2x/day for 7 days. Return in about 6 months (around 11/02/2023) for f/u with Arlee Bossard, NP (has SP tube).  Orders Placed This Encounter  Procedures   PR CHANGE CYSTOSTOMY TUBE SIMPLE   Total time spent caring for the patient today was over 45 minutes. This includes time spent on the date of the visit reviewing the patient's chart before the visit, time spent during the visit, and time spent after the visit on documentation. Over 50% of that time was spent in face-to-face  time with this patient for direct counseling. E&M based on time  and complexity of medical decision making.  It has been explained that the patient is to follow regularly with their PCP in addition to all other providers involved in their care and to follow instructions provided by these respective offices. Patient advised to contact urology clinic if any urologic-pertaining questions, concerns, new symptoms or problems arise in the interim period.  There are no Patient Instructions on file for this visit.  Electronically signed by:  Lauretta Ponto, MSN, FNP-C, CUNP 05/03/2023 11:25 AM

## 2023-05-03 ENCOUNTER — Ambulatory Visit (INDEPENDENT_AMBULATORY_CARE_PROVIDER_SITE_OTHER): Admitting: Urology

## 2023-05-03 ENCOUNTER — Encounter: Payer: Self-pay | Admitting: Urology

## 2023-05-03 ENCOUNTER — Telehealth: Payer: Self-pay

## 2023-05-03 VITALS — BP 91/59 | HR 87

## 2023-05-03 DIAGNOSIS — N401 Enlarged prostate with lower urinary tract symptoms: Secondary | ICD-10-CM | POA: Diagnosis not present

## 2023-05-03 DIAGNOSIS — R339 Retention of urine, unspecified: Secondary | ICD-10-CM

## 2023-05-03 DIAGNOSIS — R399 Unspecified symptoms and signs involving the genitourinary system: Secondary | ICD-10-CM

## 2023-05-03 DIAGNOSIS — N312 Flaccid neuropathic bladder, not elsewhere classified: Secondary | ICD-10-CM

## 2023-05-03 DIAGNOSIS — R338 Other retention of urine: Secondary | ICD-10-CM

## 2023-05-03 DIAGNOSIS — Z9359 Other cystostomy status: Secondary | ICD-10-CM

## 2023-05-03 MED ORDER — SULFAMETHOXAZOLE-TRIMETHOPRIM 400-80 MG PO TABS: 1.0000 | ORAL_TABLET | Freq: Two times a day (BID) | ORAL | 0 refills | Status: DC

## 2023-05-03 MED ORDER — CIPROFLOXACIN HCL 500 MG PO TABS
500.0000 mg | ORAL_TABLET | Freq: Once | ORAL | Status: AC
  Administered 2023-05-03: 500 mg via ORAL

## 2023-05-03 MED ORDER — AMBULATORY NON FORMULARY MEDICATION: 99 refills | Status: DC

## 2023-05-03 NOTE — Progress Notes (Signed)
 Suprapubic Cath Change  Patient is present today for a suprapubic catheter change due to urinary retention.  10 ml of water was drained from the balloon, a 16 FR foley  silicone cath was removed from the tract with out difficulty.  Suprapubic catheter site was cleaned and prepped in a sterile fashion with [id number]  A 16 FR foley cath was replaced into the tract no complications were noted. Urine return was noted, urine Clear yellow in color . 10 ml of sterile water was inflated into the balloon and a night bag was attached for drainage.  Patient tolerated well. A night bag was given to patient and proper instruction was given on how to switch bags.    Performed by: Gorden Latino, CMA  Follow up: 6 months

## 2023-05-03 NOTE — Telephone Encounter (Signed)
 Cody from [facility] called with antibiotic concerns Bactrim  which interacted with patient current medication.  Consulted with Dr. Derrick Fling on appropriated antibiotic change according to patient interaction. Verbal given to [facility] Cephalexin 500 mg 1 PO #10. Cody verbalized understanding.

## 2023-05-03 NOTE — Addendum Note (Signed)
 Addended byGriselda Lederer on: 05/03/2023 11:31 AM   Modules accepted: Orders

## 2023-05-25 ENCOUNTER — Ambulatory Visit: Payer: Medicare HMO | Admitting: "Endocrinology

## 2023-06-23 ENCOUNTER — Ambulatory Visit: Admitting: "Endocrinology

## 2023-09-13 DEATH — deceased
